# Patient Record
Sex: Female | Born: 1980 | Race: White | Hispanic: Yes | Marital: Single | Smoking: Never smoker
Health system: Southern US, Community
[De-identification: ages and names within clinical notes are randomized; demographics above are authoritative.]

## PROBLEM LIST (undated history)

## (undated) ENCOUNTER — Inpatient Hospital Stay (HOSPITAL_COMMUNITY): Payer: Self-pay

## (undated) DIAGNOSIS — E669 Obesity, unspecified: Secondary | ICD-10-CM

## (undated) DIAGNOSIS — E282 Polycystic ovarian syndrome: Secondary | ICD-10-CM

## (undated) HISTORY — DX: Obesity, unspecified: E66.9

---

## 2013-08-06 ENCOUNTER — Encounter (HOSPITAL_COMMUNITY): Payer: Self-pay | Admitting: Medical

## 2013-08-06 ENCOUNTER — Inpatient Hospital Stay (HOSPITAL_COMMUNITY): Payer: Medicaid Other

## 2013-08-06 ENCOUNTER — Inpatient Hospital Stay (HOSPITAL_COMMUNITY)
Admission: AD | Admit: 2013-08-06 | Discharge: 2013-08-06 | Disposition: A | Discharge status: Home / Self Care | Payer: Self-pay | Source: Ambulatory Visit | Attending: Obstetrics & Gynecology | Admitting: Obstetrics & Gynecology

## 2013-08-06 DIAGNOSIS — O2390 Unspecified genitourinary tract infection in pregnancy, unspecified trimester: Secondary | ICD-10-CM | POA: Insufficient documentation

## 2013-08-06 DIAGNOSIS — N760 Acute vaginitis: Secondary | ICD-10-CM | POA: Insufficient documentation

## 2013-08-06 DIAGNOSIS — A499 Bacterial infection, unspecified: Secondary | ICD-10-CM | POA: Insufficient documentation

## 2013-08-06 DIAGNOSIS — K2970 Gastritis, unspecified, without bleeding: Secondary | ICD-10-CM | POA: Insufficient documentation

## 2013-08-06 DIAGNOSIS — O26899 Other specified pregnancy related conditions, unspecified trimester: Secondary | ICD-10-CM

## 2013-08-06 DIAGNOSIS — B9689 Other specified bacterial agents as the cause of diseases classified elsewhere: Secondary | ICD-10-CM | POA: Insufficient documentation

## 2013-08-06 DIAGNOSIS — R109 Unspecified abdominal pain: Secondary | ICD-10-CM | POA: Insufficient documentation

## 2013-08-06 DIAGNOSIS — O9989 Other specified diseases and conditions complicating pregnancy, childbirth and the puerperium: Secondary | ICD-10-CM

## 2013-08-06 LAB — WET PREP, GENITAL
Trich, Wet Prep: NONE SEEN
Yeast Wet Prep HPF POC: NONE SEEN

## 2013-08-06 LAB — CBC
MCH: 29 pg (ref 26.0–34.0)
MCHC: 33.9 g/dL (ref 30.0–36.0)
Platelets: 318 10*3/uL (ref 150–400)
RBC: 4.2 MIL/uL (ref 3.87–5.11)
RDW: 13.1 % (ref 11.5–15.5)

## 2013-08-06 LAB — URINALYSIS, ROUTINE W REFLEX MICROSCOPIC
Bilirubin Urine: NEGATIVE
Glucose, UA: NEGATIVE mg/dL
Ketones, ur: NEGATIVE mg/dL
Leukocytes, UA: NEGATIVE
Specific Gravity, Urine: >1.03 — ABNORMAL HIGH (ref 1.005–1.030)
Urobilinogen, UA: 0.2 mg/dL (ref 0.0–1.0)
pH: 6 (ref 5.0–8.0)

## 2013-08-06 LAB — URINE MICROSCOPIC-ADD ON

## 2013-08-06 LAB — ABO/RH: ABO/RH(D): O POS

## 2013-08-06 LAB — POCT PREGNANCY, URINE: Preg Test, Ur: POSITIVE — AB

## 2013-08-06 MED ORDER — METRONIDAZOLE 500 MG PO TABS: 500.0000 mg | ORAL_TABLET | Freq: Two times a day (BID) | ORAL | Status: DC

## 2013-08-06 MED ORDER — FAMOTIDINE 20 MG PO TABS: 20.0000 mg | ORAL_TABLET | Freq: Two times a day (BID) | ORAL | Status: DC

## 2013-08-06 NOTE — Progress Notes (Signed)
Unable to auscultate FHT's via doppler.

## 2013-08-06 NOTE — MAU Provider Note (Signed)
Attestation of Attending Supervision of Advanced Practitioner (CNM/NP): Evaluation and management procedures were performed by the Advanced Practitioner under my supervision and collaboration.  I have reviewed the Advanced Practitioner's note and chart, and I agree with the management and plan.  HARRAWAY-SMITH, Edwyna Dangerfield 3:32 PM

## 2013-08-06 NOTE — MAU Provider Note (Signed)
History     CSN: [id number]  Arrival date and time: 08/06/13 7829   First Provider Initiated Contact with Patient 08/06/13 0914      Chief Complaint  Patient presents with  . Abdominal Pain   HPI Ashley Grant is a 32 y.o. G1P0 at [redacted]w[redacted]d who presents to MAU today with complaint of diffuse abdominal pain since this morning. The patient was seen at pregnancy care clinic yesterday and had [place] that showed [redacted]w[redacted]d fetus, but cardiac activity was unable to be obtained per report. The quality of the imaging was poor per the noted on the report. The patient states that her pain is burning and cramping and rates it at 7/10 this morning, although she is having no pain now. She states that the pain comes and goes. She does endorse occasional constipation, last BM was yesterday. She denies vaginal bleeding, discharge, UTI symptoms, fever, N/V/D.   OB History   Grav Para Term Preterm Abortions TAB SAB Ect Mult Living   1               No past medical history on file.  No past surgical history on file.  No family history on file.  History  Substance Use Topics  . Smoking status: Not on file  . Smokeless tobacco: Not on file  . Alcohol Use: Not on file    Allergies: No Known Allergies  No prescriptions prior to admission    Review of Systems  Constitutional: Negative for fever and malaise/fatigue.  Gastrointestinal: Positive for abdominal pain and constipation. Negative for heartburn, nausea, vomiting and diarrhea.  Genitourinary: Negative for dysuria, urgency and frequency.       Neg - vaginal bleeding, discharge   Physical Exam   Blood pressure 134/84, pulse 82, temperature 97.5 F (36.4 C), temperature source Oral, resp. rate 18, height 5\' 4"  (1.626 m), weight 198 lb 4 oz (89.926 kg), last menstrual period 05/12/2013.  Physical Exam  Constitutional: She is oriented to person, place, and time. She appears well-developed and well-nourished. No distress.  HENT:  Head:  Normocephalic and atraumatic.  Cardiovascular: Normal rate, regular rhythm and normal heart sounds.   Respiratory: Effort normal and breath sounds normal. No respiratory distress.  GI: Soft. Bowel sounds are normal. She exhibits no distension and no mass. There is no tenderness. There is no rebound and no guarding.  Genitourinary: Uterus is enlarged (appropriate for GA). Uterus is not tender. Cervix exhibits no motion tenderness, no discharge and no friability. Right adnexum displays no mass and no tenderness. Left adnexum displays no mass and no tenderness. No bleeding around the vagina. Vaginal discharge (moderate amount of thin, white discharge noted) found.  Neurological: She is alert and oriented to person, place, and time.  Skin: Skin is warm and dry. No erythema.  Psychiatric: She has a normal mood and affect.   Results for orders placed during the hospital encounter of 08/06/13 (from the past 24 hour(s))  URINALYSIS, ROUTINE W REFLEX MICROSCOPIC     Status: Abnormal   Collection Time    08/06/13  7:50 AM      Result Value Range   Color, Urine YELLOW  YELLOW   APPearance CLEAR  CLEAR   Specific Gravity, Urine >1.030 (*) 1.005 - 1.030   pH 6.0  5.0 - 8.0   Glucose, UA NEGATIVE  NEGATIVE mg/dL   Hgb urine dipstick TRACE (*) NEGATIVE   Bilirubin Urine NEGATIVE  NEGATIVE   Ketones, ur NEGATIVE  NEGATIVE mg/dL  Protein, ur NEGATIVE  NEGATIVE mg/dL   Urobilinogen, UA 0.2  0.0 - 1.0 mg/dL   Nitrite NEGATIVE  NEGATIVE   Leukocytes, UA NEGATIVE  NEGATIVE  URINE MICROSCOPIC-ADD ON     Status: Abnormal   Collection Time    08/06/13  7:50 AM      Result Value Range   Squamous Epithelial / LPF RARE  RARE   WBC, UA 7-10  <3 WBC/hpf   Bacteria, UA FEW (*) RARE  POCT PREGNANCY, URINE     Status: Abnormal   Collection Time    08/06/13  8:03 AM      Result Value Range   Preg Test, Ur POSITIVE (*) NEGATIVE  WET PREP, GENITAL     Status: Abnormal   Collection Time    08/06/13  9:32 AM       Result Value Range   Yeast Wet Prep HPF POC NONE SEEN  NONE SEEN   Trich, Wet Prep NONE SEEN  NONE SEEN   Clue Cells Wet Prep HPF POC FEW (*) NONE SEEN   WBC, Wet Prep HPF POC MODERATE (*) NONE SEEN  CBC     Status: None   Collection Time    08/06/13  9:36 AM      Result Value Range   WBC 7.0  4.0 - 10.5 K/uL   RBC 4.20  3.87 - 5.11 MIL/uL   Hemoglobin 12.2  12.0 - 15.0 g/dL   HCT 09.8  11.9 - 14.7 %   MCV 85.7  78.0 - 100.0 fL   MCH 29.0  26.0 - 34.0 pg   MCHC 33.9  30.0 - 36.0 g/dL   RDW 82.9  56.2 - 13.0 %   Platelets 318  150 - 400 K/uL  ABO/RH     Status: None   Collection Time    08/06/13  9:36 AM      Result Value Range   ABO/RH(D) O POS    HCG, QUANTITATIVE, PREGNANCY     Status: Abnormal   Collection Time    08/06/13  9:36 AM      Result Value Range   hCG, Beta Chain, Quant, S 1387 (*) <5 mIU/mL   US Ob Comp Less 14 Wks  08/06/2013   CLINICAL DATA:  Pelvic pain, no fetal heart tones on exam  EXAM: OBSTETRIC <14 WK ULTRASOUND  TECHNIQUE: Transabdominal ultrasound was performed for evaluation of the gestation as well as the maternal uterus and adnexal regions.  COMPARISON:  None.  FINDINGS: Intrauterine gestational sac: Probable early intrauterine gestational sac  Yolk sac:  Not visualized  Embryo:  Not visualized  Cardiac Activity: Not visualized  MSD: 4  mm   4 w   6  d       [place] EDC: 04/09/2014  Maternal uterus/adnexae: The ovaries are normal. No free fluid.  IMPRESSION: Probable early intrauterine gestational sac. No yolk sac, fetal pole, or cardiac activity yet identified. Followup is recommended in 14 days to determine appropriate pregnancy progression and for purposes of dating.   Electronically Signed   By: Christiana Pellant M.D.   On: 08/06/2013 11:18   US Ob Transvaginal  08/06/2013   CLINICAL DATA:  Pelvic pain, no fetal heart tones on exam  EXAM: OBSTETRIC <14 WK ULTRASOUND  TECHNIQUE: Transabdominal ultrasound was performed for evaluation of the gestation as well  as the maternal uterus and adnexal regions.  COMPARISON:  None.  FINDINGS: Intrauterine gestational sac: Probable early intrauterine gestational sac  Yolk  sac:  Not visualized  Embryo:  Not visualized  Cardiac Activity: Not visualized  MSD: 4  mm   4 w   6  d       [place] EDC: 04/09/2014  Maternal uterus/adnexae: The ovaries are normal. No free fluid.  IMPRESSION: Probable early intrauterine gestational sac. No yolk sac, fetal pole, or cardiac activity yet identified. Followup is recommended in 14 days to determine appropriate pregnancy progression and for purposes of dating.   Electronically Signed   By: Christiana Pellant M.D.   On: 08/06/2013 11:18     MAU Course  Procedures None  MDM Unable to obtain FHTs UPT - Positive UA, Wet prep, GC/Chlamydia today, CBC, ABO/Rh, quant hCG and [place] today Assessment and Plan  A: Probably IUGS at [redacted]w[redacted]d Abdominal pain in pregnancy, antepartum Gastritis Bacterial vaginosis  P: Discharge home Rx for Flagyl and Pepcid sent to patient's pharmacy Patient advised to discontinue weight loss medication and resume healthy eating habits First trimester warning signs discussed Patient to return to MAU in 48 hours for follow-up quant hCG Patient may return to MAU as needed or if her condition were to change or worsen   Freddi Starr, PA-C  08/06/2013, 1:18 PM

## 2013-08-06 NOTE — MAU Note (Signed)
Patient presents complaining of abdominal pain over entire abdomen since last night that burns.

## 2013-08-07 LAB — GC/CHLAMYDIA PROBE AMP: CT Probe RNA: NEGATIVE

## 2013-08-07 LAB — URINE CULTURE: Colony Count: 45000

## 2013-08-10 ENCOUNTER — Inpatient Hospital Stay (HOSPITAL_COMMUNITY)
Admission: AD | Admit: 2013-08-10 | Discharge: 2013-08-10 | Disposition: A | Discharge status: Home / Self Care | Payer: Self-pay | Source: Ambulatory Visit | Attending: Obstetrics and Gynecology | Admitting: Obstetrics and Gynecology

## 2013-08-10 ENCOUNTER — Inpatient Hospital Stay (HOSPITAL_COMMUNITY): Payer: Medicaid Other

## 2013-08-10 ENCOUNTER — Encounter (HOSPITAL_COMMUNITY): Payer: Self-pay | Admitting: *Deleted

## 2013-08-10 DIAGNOSIS — O209 Hemorrhage in early pregnancy, unspecified: Secondary | ICD-10-CM | POA: Insufficient documentation

## 2013-08-10 DIAGNOSIS — Z3490 Encounter for supervision of normal pregnancy, unspecified, unspecified trimester: Secondary | ICD-10-CM

## 2013-08-10 LAB — URINE MICROSCOPIC-ADD ON

## 2013-08-10 LAB — URINALYSIS, ROUTINE W REFLEX MICROSCOPIC
Bilirubin Urine: NEGATIVE
Ketones, ur: NEGATIVE mg/dL
Protein, ur: NEGATIVE mg/dL
Specific Gravity, Urine: 1.01 (ref 1.005–1.030)
Urobilinogen, UA: 0.2 mg/dL (ref 0.0–1.0)

## 2013-08-10 LAB — HCG, QUANTITATIVE, PREGNANCY: hCG, Beta Chain, Quant, S: 8278 m[IU]/mL — ABNORMAL HIGH (ref <5)

## 2013-08-10 NOTE — MAU Note (Addendum)
Patient presents to MAU with lower abdominal cramping and spotted bleeding that started this am. Patient not having to wear a pad at this time. Patient states had f/u appointment yesterday but was unable to come due to being out of town.

## 2013-08-10 NOTE — MAU Provider Note (Signed)
Attestation of Attending Supervision of Advanced Practitioner (CNM/NP): Evaluation and management procedures were performed by the Advanced Practitioner under my supervision and collaboration.  I have reviewed the Advanced Practitioner's note and chart, and I agree with the management and plan.  Nicolette Gieske 08/10/2013 2:20 PM

## 2013-08-10 NOTE — MAU Provider Note (Signed)
History     CSN: [id number]  Arrival date and time: 08/10/13 1125   First Provider Initiated Contact with Patient 08/10/13 1159      Chief Complaint  Patient presents with  . Vaginal Bleeding   HPI  Ashley Grant is a 32 y.o. female G1P0 at [redacted]w[redacted]d who presents to MAU for vaginal bleeding. She was here on 9/26 for abdominal pain and had a positive pregnancy test; US showed a probable gestational sac. She was supposed to come back on 9/28 for repeat beta Hcg, however she was out of town. Today she noticed pink spotting when she wiped after using the bathroom. While here in MAU she went to the bathroom and noticed brown discharge; no intercourse recently. No pain at this time; 0/10.  She has an appointment with the [facility] department this Friday.   OB History   Grav Para Term Preterm Abortions TAB SAB Ect Mult Living   1               History reviewed. No pertinent past medical history.  History reviewed. No pertinent past surgical history.  Family History  Problem Relation Age of Onset  . Diabetes Father   . Diabetes Maternal Grandfather   . Cancer Paternal Grandfather     History  Substance Use Topics  . Smoking status: Never Smoker   . Smokeless tobacco: Not on file  . Alcohol Use: No    Allergies: No Known Allergies  Prescriptions prior to admission  Medication Sig Dispense Refill  . famotidine (PEPCID) 20 MG tablet Take 1 tablet (20 mg total) by mouth 2 (two) times daily.  30 tablet  0  . metroNIDAZOLE (FLAGYL) 500 MG tablet Take 1 tablet (500 mg total) by mouth 2 (two) times daily.  14 tablet  0   Results for orders placed during the hospital encounter of 08/10/13 (from the past 24 hour(s))  URINALYSIS, ROUTINE W REFLEX MICROSCOPIC     Status: Abnormal   Collection Time    08/10/13 11:56 AM      Result Value Range   Color, Urine YELLOW  YELLOW   APPearance HAZY (*) CLEAR   Specific Gravity, Urine 1.010  1.005 - 1.030   pH 6.0  5.0 - 8.0    Glucose, UA NEGATIVE  NEGATIVE mg/dL   Hgb urine dipstick LARGE (*) NEGATIVE   Bilirubin Urine NEGATIVE  NEGATIVE   Ketones, ur NEGATIVE  NEGATIVE mg/dL   Protein, ur NEGATIVE  NEGATIVE mg/dL   Urobilinogen, UA 0.2  0.0 - 1.0 mg/dL   Nitrite NEGATIVE  NEGATIVE   Leukocytes, UA NEGATIVE  NEGATIVE  URINE MICROSCOPIC-ADD ON     Status: Abnormal   Collection Time    08/10/13 11:56 AM      Result Value Range   Squamous Epithelial / LPF FEW (*) RARE   WBC, UA 0-2  <3 WBC/hpf   RBC / HPF 0-2  <3 RBC/hpf   Bacteria, UA FEW (*) RARE  HCG, QUANTITATIVE, PREGNANCY     Status: Abnormal   Collection Time    08/10/13 12:15 PM      Result Value Range   hCG, Beta Chain, Quant, S 8278 (*) <5 mIU/mL   US Ob Transvaginal  08/10/2013   CLINICAL DATA:  Pregnant, bleeding  EXAM: TRANSVAGINAL OB ULTRASOUND  TECHNIQUE: Transvaginal ultrasound was performed for complete evaluation of the gestation as well as the maternal uterus, adnexal regions, and pelvic cul-de-sac.  COMPARISON:  08/06/2013  FINDINGS: Intrauterine gestational sac: Visualized/normal in shape.  Yolk sac:  Present  Embryo:  Not visualized  MSD: 7.9  mm   5 w   3  d  [place] EDC: 04/09/2014  Maternal uterus/adnexae: No evidence of subchorionic hemorrhage.  Bilateral ovaries are within normal limits.  No free fluid.  IMPRESSION: Single intrauterine gestational sac with yolk sac, measuring 5 weeks 3 days by mean sac diameter. Fetal pole is not visualized.  Follow-up pelvic ultrasound is suggested in 10-14 days to confirm viability.   Electronically Signed   By: Charline Bills M.D.   On: 08/10/2013 13:41     Review of Systems  Constitutional: Negative for fever and chills.  Gastrointestinal: Negative for nausea, vomiting, abdominal pain, diarrhea and constipation.  Genitourinary: Negative for dysuria, urgency, frequency and hematuria.       + brown vaginal discharge. No vaginal bleeding. No dysuria.   Neurological: Negative for headaches.    Physical Exam   Blood pressure 123/74, pulse 90, temperature 98.1 F (36.7 C), temperature source Oral, resp. rate 18, height 5\' 4"  (1.626 m), weight 90.084 kg (198 lb 9.6 oz), last menstrual period 05/12/2013, SpO2 99.00%.  Physical Exam  Constitutional: She is oriented to person, place, and time. She appears well-developed and well-nourished. No distress.  Neck: Neck supple.  Respiratory: Effort normal.  GI: Soft. She exhibits no distension. There is no tenderness. There is no rebound and no guarding.  Genitourinary:  Speculum exam: Vagina - moderate amount of dark red blood in vaginal canal, no odor Cervix - small active bleeding from cervical os; dark red in color. Bimanual exam: Cervix closed Uterus non tender, normal size for gestational age  Adnexa non tender, no masses bilaterally Chaperone present for exam.   Neurological: She is alert and oriented to person, place, and time.  Skin: Skin is warm and dry. She is not diaphoretic.    MAU Course  Procedures None  MDM Repeat Beta Hcg Repeat transvaginal US   O positive blood type Assessment and Plan  A: Singling intrauterine gestational sac with yolk sac Vaginal bleeding in first trimester   P: Discharge home Pelvic rest discussed Bleeding precautions discussed First trimester warning signs discussed  Start prenatal care as soon as possible Return to MAU if symptoms worsen Proof of pregnancy letter provided to the patient   Debbrah Alar FNP-C 08/10/2013, 2:04 PM

## 2013-09-20 ENCOUNTER — Other Ambulatory Visit: Payer: Self-pay

## 2013-09-20 ENCOUNTER — Other Ambulatory Visit (HOSPITAL_COMMUNITY): Payer: Self-pay | Admitting: Physician Assistant

## 2013-09-20 DIAGNOSIS — Z3682 Encounter for antenatal screening for nuchal translucency: Secondary | ICD-10-CM

## 2013-09-20 LAB — OB RESULTS CONSOLE HEPATITIS B SURFACE ANTIGEN: Hepatitis B Surface Ag: NEGATIVE

## 2013-09-20 LAB — OB RESULTS CONSOLE GC/CHLAMYDIA
CHLAMYDIA, DNA PROBE: NEGATIVE
Gonorrhea: NEGATIVE

## 2013-09-20 LAB — OB RESULTS CONSOLE ABO/RH: RH Type: POSITIVE

## 2013-09-20 LAB — OB RESULTS CONSOLE HIV ANTIBODY (ROUTINE TESTING): HIV: NONREACTIVE

## 2013-09-20 LAB — OB RESULTS CONSOLE RUBELLA ANTIBODY, IGM: RUBELLA: IMMUNE

## 2013-09-20 LAB — OB RESULTS CONSOLE ANTIBODY SCREEN: Antibody Screen: NEGATIVE

## 2013-09-27 ENCOUNTER — Ambulatory Visit (HOSPITAL_COMMUNITY)
Admission: RE | Admit: 2013-09-27 | Discharge: 2013-09-27 | Disposition: A | Discharge status: Home / Self Care | Payer: Medicaid Other | Source: Ambulatory Visit | Attending: Physician Assistant | Admitting: Physician Assistant

## 2013-09-27 ENCOUNTER — Encounter (HOSPITAL_COMMUNITY): Payer: Self-pay

## 2013-09-27 ENCOUNTER — Other Ambulatory Visit: Payer: Self-pay

## 2013-09-27 VITALS — BP 116/77 | HR 112 | Wt 208.0 lb

## 2013-09-27 DIAGNOSIS — Z3689 Encounter for other specified antenatal screening: Secondary | ICD-10-CM | POA: Insufficient documentation

## 2013-09-27 DIAGNOSIS — O351XX0 Maternal care for (suspected) chromosomal abnormality in fetus, not applicable or unspecified: Secondary | ICD-10-CM | POA: Insufficient documentation

## 2013-09-27 DIAGNOSIS — O3510X0 Maternal care for (suspected) chromosomal abnormality in fetus, unspecified, not applicable or unspecified: Secondary | ICD-10-CM | POA: Insufficient documentation

## 2013-09-27 DIAGNOSIS — Z3682 Encounter for antenatal screening for nuchal translucency: Secondary | ICD-10-CM

## 2013-10-01 ENCOUNTER — Encounter (HOSPITAL_COMMUNITY): Payer: Self-pay | Admitting: *Deleted

## 2013-10-18 ENCOUNTER — Other Ambulatory Visit: Payer: Self-pay | Admitting: Family Medicine

## 2013-10-18 DIAGNOSIS — Z3689 Encounter for other specified antenatal screening: Secondary | ICD-10-CM

## 2013-11-15 ENCOUNTER — Ambulatory Visit (HOSPITAL_COMMUNITY): Payer: Self-pay

## 2013-11-16 ENCOUNTER — Ambulatory Visit (HOSPITAL_COMMUNITY)
Admission: RE | Admit: 2013-11-16 | Discharge: 2013-11-16 | Disposition: A | Discharge status: Home / Self Care | Payer: Medicaid Other | Source: Ambulatory Visit | Attending: Family Medicine | Admitting: Family Medicine

## 2013-11-16 DIAGNOSIS — Z3689 Encounter for other specified antenatal screening: Secondary | ICD-10-CM | POA: Insufficient documentation

## 2013-11-18 ENCOUNTER — Other Ambulatory Visit (HOSPITAL_COMMUNITY): Payer: Self-pay | Admitting: Family

## 2013-11-18 DIAGNOSIS — Z1389 Encounter for screening for other disorder: Secondary | ICD-10-CM

## 2013-12-20 ENCOUNTER — Ambulatory Visit (HOSPITAL_COMMUNITY)
Admission: RE | Admit: 2013-12-20 | Discharge: 2013-12-20 | Disposition: A | Discharge status: Home / Self Care | Payer: Medicaid Other | Source: Ambulatory Visit | Attending: Family | Admitting: Family

## 2013-12-20 DIAGNOSIS — Z3689 Encounter for other specified antenatal screening: Secondary | ICD-10-CM | POA: Insufficient documentation

## 2013-12-20 DIAGNOSIS — Z1389 Encounter for screening for other disorder: Secondary | ICD-10-CM

## 2013-12-20 NOTE — Progress Notes (Signed)
MFM ultrasound  Indication: 33 yr old G1P0 at 5555w4d for follow up ultrasound to complete fetal anatomy. Remote read.  Findings: 1. Single intrauterine pregnancy. 2. Estimated fetal weight is in the 57th%. 3. Posterior placenta without evidence of previa. 4. Normal amniotic fluid volume. 5. The view of the right outflow tract is limited. 6. The remainder of the limited anatomy survey is normal.  Recommendations: 1. Appropriate fetal growth . 2. Limited heart views: - recommend follow up in [facility] so MFM may scan 3. Normal first trimester screen  Ashley FosterKristen Inas Avena, MD

## 2014-01-10 ENCOUNTER — Encounter (HOSPITAL_COMMUNITY): Payer: Self-pay | Admitting: Nurse Practitioner

## 2014-01-10 ENCOUNTER — Other Ambulatory Visit (HOSPITAL_COMMUNITY): Payer: Self-pay | Admitting: Nurse Practitioner

## 2014-01-10 DIAGNOSIS — O2690 Pregnancy related conditions, unspecified, unspecified trimester: Secondary | ICD-10-CM

## 2014-01-13 ENCOUNTER — Ambulatory Visit (HOSPITAL_COMMUNITY)
Admission: RE | Admit: 2014-01-13 | Discharge: 2014-01-13 | Disposition: A | Discharge status: Home / Self Care | Payer: Medicaid Other | Source: Ambulatory Visit | Attending: Nurse Practitioner | Admitting: Nurse Practitioner

## 2014-01-13 ENCOUNTER — Other Ambulatory Visit (HOSPITAL_COMMUNITY): Payer: Self-pay | Admitting: Nurse Practitioner

## 2014-01-13 DIAGNOSIS — O2690 Pregnancy related conditions, unspecified, unspecified trimester: Secondary | ICD-10-CM

## 2014-01-13 DIAGNOSIS — Z3689 Encounter for other specified antenatal screening: Secondary | ICD-10-CM | POA: Insufficient documentation

## 2014-01-25 LAB — OB RESULTS CONSOLE RPR: RPR: NONREACTIVE

## 2014-03-14 ENCOUNTER — Other Ambulatory Visit (HOSPITAL_COMMUNITY): Payer: Self-pay | Admitting: Nurse Practitioner

## 2014-03-14 DIAGNOSIS — O3660X0 Maternal care for excessive fetal growth, unspecified trimester, not applicable or unspecified: Secondary | ICD-10-CM

## 2014-03-14 LAB — OB RESULTS CONSOLE GBS: GBS: NEGATIVE

## 2014-03-15 ENCOUNTER — Ambulatory Visit (HOSPITAL_COMMUNITY)
Admission: RE | Admit: 2014-03-15 | Discharge: 2014-03-15 | Disposition: A | Discharge status: Home / Self Care | Payer: Self-pay | Source: Ambulatory Visit | Attending: Nurse Practitioner | Admitting: Nurse Practitioner

## 2014-03-15 DIAGNOSIS — O3660X0 Maternal care for excessive fetal growth, unspecified trimester, not applicable or unspecified: Secondary | ICD-10-CM | POA: Insufficient documentation

## 2014-03-15 DIAGNOSIS — Z3689 Encounter for other specified antenatal screening: Secondary | ICD-10-CM | POA: Insufficient documentation

## 2014-03-24 ENCOUNTER — Inpatient Hospital Stay (HOSPITAL_COMMUNITY)
Admission: AD | Admit: 2014-03-24 | Discharge: 2014-03-24 | Disposition: A | Discharge status: Home / Self Care | Payer: Medicaid Other | Source: Ambulatory Visit | Attending: Obstetrics and Gynecology | Admitting: Obstetrics and Gynecology

## 2014-03-24 ENCOUNTER — Encounter (HOSPITAL_COMMUNITY): Payer: Self-pay | Admitting: *Deleted

## 2014-03-24 DIAGNOSIS — Z833 Family history of diabetes mellitus: Secondary | ICD-10-CM | POA: Insufficient documentation

## 2014-03-24 DIAGNOSIS — O99891 Other specified diseases and conditions complicating pregnancy: Secondary | ICD-10-CM | POA: Insufficient documentation

## 2014-03-24 DIAGNOSIS — G5600 Carpal tunnel syndrome, unspecified upper limb: Secondary | ICD-10-CM | POA: Insufficient documentation

## 2014-03-24 DIAGNOSIS — IMO0002 Reserved for concepts with insufficient information to code with codable children: Secondary | ICD-10-CM | POA: Insufficient documentation

## 2014-03-24 DIAGNOSIS — O9989 Other specified diseases and conditions complicating pregnancy, childbirth and the puerperium: Secondary | ICD-10-CM

## 2014-03-24 DIAGNOSIS — O1203 Gestational edema, third trimester: Secondary | ICD-10-CM

## 2014-03-24 DIAGNOSIS — R209 Unspecified disturbances of skin sensation: Secondary | ICD-10-CM | POA: Insufficient documentation

## 2014-03-24 LAB — URINE MICROSCOPIC-ADD ON

## 2014-03-24 LAB — URINALYSIS, ROUTINE W REFLEX MICROSCOPIC
Bilirubin Urine: NEGATIVE
GLUCOSE, UA: 250 mg/dL — AB
Ketones, ur: 15 mg/dL — AB
Leukocytes, UA: NEGATIVE
Nitrite: NEGATIVE
PROTEIN: NEGATIVE mg/dL
Specific Gravity, Urine: >1.03 — ABNORMAL HIGH (ref 1.005–1.030)
UROBILINOGEN UA: 0.2 mg/dL (ref 0.0–1.0)
pH: 6 (ref 5.0–8.0)

## 2014-03-24 NOTE — MAU Note (Signed)
Swelling in feet and hands, numbness in hands, started about two days ago and has gotten worse.  Denies LOF/VB.

## 2014-03-24 NOTE — MAU Provider Note (Signed)
None     Chief Complaint:  Leg Swelling and Numbness in hands  Carrolyn LeighSusana Siliesar-Lopez is  33 y.o. G1P0 at 5951w5d presents complaining of Leg Swelling and Numbness She denies HA/RUQ pain/visual changes.  Wrists are mildly sore. Obstetrical/Gynecological History: OB History   Grav Para Term Preterm Abortions TAB SAB Ect Mult Living   1              Past Medical History: History reviewed. No pertinent past medical history.  Past Surgical History: History reviewed. No pertinent past surgical history.  Family History: Family History  Problem Relation Age of Onset  . Diabetes Father   . Diabetes Maternal Grandfather   . Cancer Paternal Grandfather     Social History: History  Substance Use Topics  . Smoking status: Never Smoker   . Smokeless tobacco: Not on file  . Alcohol Use: No    Allergies: No Known Allergies  Meds:  Prescriptions prior to admission  Medication Sig Dispense Refill  . calcium carbonate (TUMS - DOSED IN MG ELEMENTAL CALCIUM) 500 MG chewable tablet Chew 1-2 tablets by mouth daily as needed for indigestion or heartburn.      . Menthol-Methyl Salicylate (ICY HOT EXTRA STRENGTH) 10-30 % CREA Apply 1 application topically daily as needed (For swelling and pain in legs.).      Marland Kitchen. OVER THE COUNTER MEDICATION Apply 1 application topically daily. Patient is using Goicoechea lotion on legs once a day for swelling and pain in legs.      . Prenatal Vit-Fe Fumarate-FA (PRENATAL MULTIVITAMIN) TABS tablet Take 1 tablet by mouth daily at 12 noon.        Review of Systems   Constitutional: Negative for fever and chills Eyes: Negative for visual disturbances Respiratory: Negative for shortness of breath, dyspnea Cardiovascular: Negative for chest pain or palpitations  Gastrointestinal: Negative for vomiting, diarrhea and constipation Genitourinary: Negative for dysuria and urgency Musculoskeletal: Negative for back pain, joint pain, myalgias  Neurological: Negative  for dizziness and headaches    Physical Exam  Blood pressure 127/80, pulse 111, temperature 98 F (36.7 C), temperature source Oral, resp. rate 18, last menstrual period 05/12/2013. 100/81 GENERAL: Well-developed, well-nourished female in no acute distress.  LUNGS: Clear to auscultation bilaterally.  HEART: Regular rate and rhythm. ABDOMEN: Soft, nontender, nondistended, gravid.  EXTREMITIES: Nontender, 2+ edema, 2+ distal pulses. DTR's 2+ FHT:  Baseline rate 145 bpm   Variability moderate  Accelerations present   Decelerations none Contractions: Every 0 mins   Labs: No results found for this or any previous visit (from the past 24 hour(s)). IAssessment: Carrolyn LeighSusana Siliesar-Lopez is  33 y.o. G1P0 at 5051w5d presents with swelling of pregnancy; carpal tunnel.  Plan: Splint wrists prn; increase po fluids; compression socks; elevate legs  Jacklyn ShellFrances Cresenzo-Dishmon 5/14/20159:18 PM

## 2014-03-24 NOTE — Discharge Instructions (Signed)
Síndrome del túnel carpiano   (Carpal Tunnel Syndrome)   El túnel carpiano es un espacio estrecho ubicado en el lado palmar de la muñeca. Está formado por los huesos de la muñeca y los ligamentos. Los nervios, vasos sanguíneos y tendones pasan a través del túnel carpiano. Los movimientos de la muñeca o ciertas enfermedades pueden causar hinchazón del túnel. Esta hinchazón comprime el nervio principal en la muñeca ((nervio mediano) y ocasiona un trastorno doloroso que se denomina síndrome del túnel carpiano.  CAUSAS   · Movimientos repetidos de la muñeca.  · Lesiones en la muñeca.  · Ciertas enfermedades como la artritis, la diabetes, el alcoholismo, el hipertiroidismo o la insuficiencia renal.  · Obesidad.  · Embarazo.  SÍNTOMAS   · Sensación de "pinchazos" en los dedos o la mano.  · Hormigueo o entumecimiento en los dedos o en la mano.  · Sensación dolorosa en todo el brazo.  · Dolor en la muñeca que sube por el brazo hasta el hombro.  · Dolor que baja por la mano o los dedos.  · Sensación de debilidad en las manos.  DIAGNÓSTICO   El médico le hará una historia clínica y un examen físico. Puede ser necesario hacer un electromiograma. Esta prueba mide las señales eléctricas enviadas por los músculos. Generalmente el paso de las señales eléctricas es impedido por el síndrome del túnel carpiano. También puede ser necesario que le tomen radiografías.   TRATAMIENTO   El síndrome del túnel carpiano puede curarse espontáneamente sin tratamiento. El médico le indicará el uso de un cabestrillo para la muñeca o que tome medicamentos como antiinflamatorios no esteroides. Las inyecciones de cortisona pueden ayudar. A veces es necesaria la cirugía para liberar el nervio comprimido.   INSTRUCCIONES PARA EL CUIDADO EN EL HOGAR   · Tome todos los medicamentos según le indicó su médico. Sólo tome medicamentos de venta libre o recetados para calmar el dolor, las molestias o bajar la fiebre según las indicaciones de su médico.  · Si  le aconsejaron usar un cabestrillo para evitar que la muñeca se doble, úselo como le indicaron. Es importante que use el cabestrillo durante la noche. Úselo mientras sienta dolor o adormecimiento en la mano, el brazo o la muñeca. Esto puede durar entre 1 y 2 meses.  · Haga reposar la muñeca de toda actividad que le cause dolor. Si sus síntomas están relacionados con el trabajo, deberá conversar con su empleador acerca de la posibilidad de cambiar a una tarea que no requiera el uso de la muñeca.  · Aplique hielo en la muñeca después de los períodos prolongados de actividad.  · Ponga el hielo en una bolsa plástica.  · Colóquese una toalla entre la piel y la bolsa de hielo.  · Deje el hielo en el lugar durante 15 a 20 minutos, 3 a 4 veces por día.  · Cumpla con todas las visitas de control, según le indique su médico. Aquí se incluyen derivaciones a un ortopedista, fisioterapia y rehabilitación. Toda demora en obtener la asistencia necesaria puede dar como resultado una demora o fracaso en la curación.  SOLICITE ATENCIÓN MÉDICA DE INMEDIATO SI:   · Desarrolla nuevos e inexplicables síntomas.  · Los síntomas actuales empeoran y la medicación no los controla.  ASEGÚRESE DE QUE:   · Comprende estas instrucciones.  · Controlará su enfermedad.  · Solicitará ayuda de inmediato si no mejora o si empeora.  Document Released: 10/28/2005 Document Revised: 07/22/2012  Exit[employer]® Patient Information ©2014 [employer]   [employer].

## 2014-03-25 NOTE — MAU Provider Note (Signed)
Attestation of Attending Supervision of Advanced Practitioner (CNM/NP): Evaluation and management procedures were performed by the Advanced Practitioner under my supervision and collaboration.  I have reviewed the Advanced Practitioner's note and chart, and I agree with the management and plan.  Tekeya Geffert 03/25/2014 7:52 AM

## 2014-04-06 ENCOUNTER — Inpatient Hospital Stay (HOSPITAL_COMMUNITY)
Admission: AD | Admit: 2014-04-06 | Discharge: 2014-04-11 | DRG: 765 | Disposition: A | Discharge status: Home / Self Care | Payer: Medicaid Other | Source: Ambulatory Visit | Attending: Obstetrics & Gynecology | Admitting: Obstetrics & Gynecology

## 2014-04-06 ENCOUNTER — Encounter (HOSPITAL_COMMUNITY): Payer: Self-pay | Admitting: *Deleted

## 2014-04-06 DIAGNOSIS — E669 Obesity, unspecified: Secondary | ICD-10-CM | POA: Diagnosis present

## 2014-04-06 DIAGNOSIS — O99214 Obesity complicating childbirth: Secondary | ICD-10-CM

## 2014-04-06 DIAGNOSIS — Z833 Family history of diabetes mellitus: Secondary | ICD-10-CM | POA: Diagnosis not present

## 2014-04-06 DIAGNOSIS — O3660X0 Maternal care for excessive fetal growth, unspecified trimester, not applicable or unspecified: Secondary | ICD-10-CM

## 2014-04-06 DIAGNOSIS — O139 Gestational [pregnancy-induced] hypertension without significant proteinuria, unspecified trimester: Secondary | ICD-10-CM | POA: Diagnosis present

## 2014-04-06 DIAGNOSIS — O479 False labor, unspecified: Secondary | ICD-10-CM | POA: Diagnosis present

## 2014-04-06 DIAGNOSIS — Z6841 Body Mass Index (BMI) 40.0 and over, adult: Secondary | ICD-10-CM

## 2014-04-06 DIAGNOSIS — Z98891 History of uterine scar from previous surgery: Secondary | ICD-10-CM

## 2014-04-06 LAB — COMPREHENSIVE METABOLIC PANEL
ALT: 11 U/L (ref 0–35)
AST: 16 U/L (ref 0–37)
Albumin: 2.7 g/dL — ABNORMAL LOW (ref 3.5–5.2)
Alkaline Phosphatase: 152 U/L — ABNORMAL HIGH (ref 39–117)
BUN: 12 mg/dL (ref 6–23)
CO2: 20 mEq/L (ref 19–32)
Calcium: 8.9 mg/dL (ref 8.4–10.5)
Chloride: 105 mEq/L (ref 96–112)
Creatinine, Ser: 0.67 mg/dL (ref 0.50–1.10)
GFR calc Af Amer: >90 mL/min (ref >90)
Glucose, Bld: 77 mg/dL (ref 70–99)
Potassium: 4.5 mEq/L (ref 3.7–5.3)
SODIUM: 138 meq/L (ref 137–147)
Total Bilirubin: <0.2 mg/dL — ABNORMAL LOW (ref 0.3–1.2)
Total Protein: 5.8 g/dL — ABNORMAL LOW (ref 6.0–8.3)

## 2014-04-06 LAB — CBC
HCT: 34.7 % — ABNORMAL LOW (ref 36.0–46.0)
HEMOGLOBIN: 11.3 g/dL — AB (ref 12.0–15.0)
MCH: 27.6 pg (ref 26.0–34.0)
MCHC: 32.6 g/dL (ref 30.0–36.0)
MCV: 84.8 fL (ref 78.0–100.0)
Platelets: 283 10*3/uL (ref 150–400)
RBC: 4.09 MIL/uL (ref 3.87–5.11)
RDW: 14.5 % (ref 11.5–15.5)
WBC: 10.3 10*3/uL (ref 4.0–10.5)

## 2014-04-06 LAB — TYPE AND SCREEN
ABO/RH(D): O POS
Antibody Screen: NEGATIVE

## 2014-04-06 LAB — PROTEIN / CREATININE RATIO, URINE
Creatinine, Urine: 170.33 mg/dL
PROTEIN CREATININE RATIO: 0.1 (ref 0.00–0.15)
Total Protein, Urine: 16.4 mg/dL

## 2014-04-06 MED ORDER — OXYCODONE-ACETAMINOPHEN 5-325 MG PO TABS: 1.0000 | ORAL_TABLET | ORAL | Status: DC | PRN

## 2014-04-06 MED ORDER — IBUPROFEN 600 MG PO TABS: 600.0000 mg | ORAL_TABLET | Freq: Four times a day (QID) | ORAL | Status: DC | PRN

## 2014-04-06 MED ORDER — LIDOCAINE HCL (PF) 1 % IJ SOLN: 30.0000 mL | INTRAMUSCULAR | Status: DC | PRN

## 2014-04-06 MED ORDER — TERBUTALINE SULFATE 1 MG/ML IJ SOLN: 0.2500 mg | Freq: Once | INTRAMUSCULAR | Status: AC | PRN

## 2014-04-06 MED ORDER — MISOPROSTOL 25 MCG QUARTER TABLET
25.0000 ug | ORAL_TABLET | ORAL | Status: DC
  Administered 2014-04-06 – 2014-04-07 (×3): 25 ug via VAGINAL
  Filled 2014-04-06 (×3): qty 0.25

## 2014-04-06 MED ORDER — ACETAMINOPHEN 325 MG PO TABS
650.0000 mg | ORAL_TABLET | ORAL | Status: DC | PRN
Start: 2014-04-06 — End: 2014-04-09

## 2014-04-06 MED ORDER — LABETALOL HCL 5 MG/ML IV SOLN
10.0000 mg | INTRAVENOUS | Status: DC | PRN
Start: 2014-04-06 — End: 2014-04-09

## 2014-04-06 MED ORDER — OXYTOCIN 40 UNITS IN LACTATED RINGERS INFUSION - SIMPLE MED: 62.5000 mL/h | INTRAVENOUS | Status: DC

## 2014-04-06 MED ORDER — CITRIC ACID-SODIUM CITRATE 334-500 MG/5ML PO SOLN
30.0000 mL | ORAL | Status: DC | PRN
  Administered 2014-04-08: 30 mL via ORAL
  Filled 2014-04-06: qty 15

## 2014-04-06 MED ORDER — LACTATED RINGERS IV SOLN: 500.0000 mL | INTRAVENOUS | Status: DC | PRN

## 2014-04-06 MED ORDER — ZOLPIDEM TARTRATE 5 MG PO TABS
5.0000 mg | ORAL_TABLET | Freq: Every evening | ORAL | Status: DC | PRN
  Administered 2014-04-07: 5 mg via ORAL
  Filled 2014-04-06: qty 1

## 2014-04-06 MED ORDER — OXYTOCIN BOLUS FROM INFUSION: 500.0000 mL | INTRAVENOUS | Status: DC

## 2014-04-06 MED ORDER — LACTATED RINGERS IV SOLN
INTRAVENOUS | Status: DC
  Administered 2014-04-06 – 2014-04-08 (×7): via INTRAVENOUS

## 2014-04-06 MED ORDER — ONDANSETRON HCL 4 MG/2ML IJ SOLN
4.0000 mg | Freq: Four times a day (QID) | INTRAMUSCULAR | Status: DC | PRN
  Administered 2014-04-08: 4 mg via INTRAVENOUS
  Filled 2014-04-06: qty 2

## 2014-04-06 NOTE — Progress Notes (Signed)
Ashley Grant is a 33 y.o. G1P0 at [redacted]w[redacted]d   Subjective: Feels well; ready to begin induction; denies s/s preeclampsia  Objective: BP 142/101  Pulse 96  Temp(Src) 98.2 F (36.8 C) (Oral)  Resp 20  Ht 5\' 5"  (1.651 m)  Wt 120.385 kg (265 lb 6.4 oz)  BMI 44.16 kg/m2  SpO2 98%  LMP 05/12/2013      FHT:  FHR: 150s bpm, variability: moderate,  accelerations:  Present,  decelerations:  Absent UC:   Irreg, no pattern SVE:   Dilation: Closed Effacement (%): Thick Station: -3 Exam by:: Bertram Millard, rn  Labs: Lab Results  Component Value Date   WBC 10.3 04/06/2014   HGB 11.3* 04/06/2014   HCT 34.7* 04/06/2014   MCV 84.8 04/06/2014   PLT 283 04/06/2014    Assessment / Plan: Begin IOL process GHTN  Cytotec to be placed shortly; IV Lab prn 160/105 CBC/CMET essential nl for preeclampsia eval; urine P/C ratio pending  Ashley Grant 04/06/2014, 9:17 PM

## 2014-04-06 NOTE — MAU Note (Signed)
Patient was called and told to come to [facility] due to her elevated blood pressures. Patient is to be an admission for IOL to [facility]. Patient denies any headaches or visual problems at this time. Reports irregular contractions, no bleeding or leaking and good fetal movement. Has increasing swelling in lower legs and feet.

## 2014-04-06 NOTE — H&P (Signed)
Nary Dembek is a 33 y.o. female G1P0 at [redacted]w[redacted]d who presents to labor and delivery for induction of labor due to [facility]. Pt has been getting prenatal care with the health department. Had her last prenatal visit yesterday. Pt was called today and told [facility] due to high blood pressures. In the MAU pts BPs were 140s/100s x2. Pt has had an uncomplicated pregnancy up to this point and denies an abdominal pain, vaginal bleeding, vision changes, headaches, fever, n/v. She does report significant lower extremity edema x1 month. Pt denies any medical conditions previous to pregnancy including diabetes, HTN, tobacco abuse, and does not take any medication.    Maternal Medical History:  Reason for admission: Nausea.    OB History   Grav Para Term Preterm Abortions TAB SAB Ect Mult Living   1              History reviewed. No pertinent past medical history. History reviewed. No pertinent past surgical history. Family History: family history includes Cancer in her paternal grandfather; Diabetes in her father and maternal grandfather. Social History:  reports that she has never smoked. She does not have any smokeless tobacco history on file. She reports that she does not drink alcohol or use illicit drugs.   Prenatal Transfer Tool  Maternal Diabetes: No Genetic Screening: Normal Maternal Ultrasounds/Referrals: Abnormal:  Findings:   Other: Large for dates 03/15/14 71% Fetal Ultrasounds or other Referrals:  None Maternal Substance Abuse:  No Significant Maternal Medications:  None Significant Maternal Lab Results:  None Other Comments:  None  Review of Systems  Constitutional: Negative for fever, chills and malaise/fatigue.  Eyes: Negative for blurred vision, double vision and photophobia.  Respiratory: Negative for cough and shortness of breath.   Cardiovascular: Negative for chest pain.  Gastrointestinal: Negative for nausea, vomiting and  abdominal pain.  Neurological: Negative for dizziness, weakness and headaches.  All other systems reviewed and are negative.     Blood pressure 142/101, pulse 96, temperature 98.2 F (36.8 C), temperature source Oral, resp. rate 20, height 5\' 5"  (1.651 m), weight 120.385 kg (265 lb 6.4 oz), last menstrual period 05/12/2013, SpO2 98.00%. Exam Physical Exam  Constitutional: She is oriented to person, place, and time. She appears well-developed and well-nourished.  HENT:  Head: Normocephalic and atraumatic.  Cardiovascular: Normal rate and regular rhythm.   Respiratory: Effort normal and breath sounds normal.  GI: There is no tenderness. There is no guarding.  Neurological: She is alert and oriented to person, place, and time.  Skin: Skin is warm and dry.   Exremities: 3+ pitting edema of lower extremities up to the knees bilaterally Cervix: Closed and thick  Prenatal labs: ABO, Rh: O/Positive/-- (11/10 0000) Antibody: Negative (11/10 0000) Rubella: Immune (11/10 0000) RPR: Nonreactive (03/17 0000)  HBsAg: Negative (11/10 0000)  HIV: Non-reactive (11/10 0000)  GBS: Negative (05/04 0000)   Assessment/Plan:  Induction of Labor, GHTN, Unfavorable Cervix 1. Cytotec initiated 2. Foley bulb when able 3. Continuous fetal monitoring 4. PIH labs pending     Lawernce Pitts 04/06/2014, 8:19 PM  I have seen and examined this patient and I agree with the above. PIH labs neg; urine P/C ratio: 0.10. Will watch BPs closely and tx as needed for 160/100. Arabella Merles CNM 1:15 AM 04/07/2014

## 2014-04-07 LAB — RPR

## 2014-04-07 MED ORDER — MISOPROSTOL 25 MCG QUARTER TABLET
50.0000 ug | ORAL_TABLET | ORAL | Status: DC
  Filled 2014-04-07: qty 0.5

## 2014-04-07 MED ORDER — PHENYLEPHRINE 40 MCG/ML (10ML) SYRINGE FOR IV PUSH (FOR BLOOD PRESSURE SUPPORT)
80.0000 ug | PREFILLED_SYRINGE | INTRAVENOUS | Status: DC | PRN
  Filled 2014-04-07 (×2): qty 10

## 2014-04-07 MED ORDER — FENTANYL CITRATE 0.05 MG/ML IJ SOLN
100.0000 ug | INTRAMUSCULAR | Status: DC | PRN
  Administered 2014-04-07: 100 ug via INTRAVENOUS
  Filled 2014-04-07: qty 2

## 2014-04-07 MED ORDER — DIPHENHYDRAMINE HCL 50 MG/ML IJ SOLN: 12.5000 mg | INTRAMUSCULAR | Status: DC | PRN

## 2014-04-07 MED ORDER — CALCIUM CARBONATE ANTACID 500 MG PO CHEW
2.0000 | CHEWABLE_TABLET | Freq: Two times a day (BID) | ORAL | Status: DC | PRN
  Administered 2014-04-07: 400 mg via ORAL
  Filled 2014-04-07: qty 1

## 2014-04-07 MED ORDER — TERBUTALINE SULFATE 1 MG/ML IJ SOLN: 0.2500 mg | Freq: Once | INTRAMUSCULAR | Status: AC | PRN

## 2014-04-07 MED ORDER — LACTATED RINGERS IV SOLN
500.0000 mL | Freq: Once | INTRAVENOUS | Status: AC
  Administered 2014-04-08: 500 mL via INTRAVENOUS

## 2014-04-07 MED ORDER — EPHEDRINE 5 MG/ML INJ
10.0000 mg | INTRAVENOUS | Status: DC | PRN
  Filled 2014-04-07 (×2): qty 4

## 2014-04-07 MED ORDER — FENTANYL 2.5 MCG/ML BUPIVACAINE 1/10 % EPIDURAL INFUSION (WH - ANES)
14.0000 mL/h | INTRAMUSCULAR | Status: DC | PRN
  Administered 2014-04-08 (×3): 14 mL/h via EPIDURAL
  Filled 2014-04-07 (×3): qty 125

## 2014-04-07 MED ORDER — OXYTOCIN 40 UNITS IN LACTATED RINGERS INFUSION - SIMPLE MED
1.0000 m[IU]/min | INTRAVENOUS | Status: DC
  Administered 2014-04-07: 4 m[IU]/min via INTRAVENOUS
  Administered 2014-04-07: 2 m[IU]/min via INTRAVENOUS
  Filled 2014-04-07: qty 1000

## 2014-04-07 MED ORDER — PHENYLEPHRINE 40 MCG/ML (10ML) SYRINGE FOR IV PUSH (FOR BLOOD PRESSURE SUPPORT): 80.0000 ug | PREFILLED_SYRINGE | INTRAVENOUS | Status: DC | PRN

## 2014-04-07 MED ORDER — EPHEDRINE 5 MG/ML INJ: 10.0000 mg | INTRAVENOUS | Status: DC | PRN

## 2014-04-07 MED ORDER — FENTANYL CITRATE 0.05 MG/ML IJ SOLN: 100.0000 ug | INTRAMUSCULAR | Status: DC | PRN

## 2014-04-07 NOTE — Progress Notes (Signed)
Ashley Grant is a 33 y.o. G1P0 at [redacted]w[redacted]d   Subjective: Pt complains of being hungry. Rec'd Cytotec x3 no other complaints at this time.  Objective: BP 130/82  Pulse 80  Temp(Src) 98.1 F (36.7 C) (Oral)  Resp 18  Ht 5\' 5"  (1.651 m)  Wt 120.385 kg (265 lb 6.4 oz)  BMI 44.16 kg/m2  SpO2 98%  LMP 05/12/2013      FHT:  FHR: 130s bpm, variability: moderate,  accelerations:  Present,  decelerations:  Absent UC:   Irreg, q2-38min SVE:   Dilation: Closed Effacement (%): Thick Station: -3 Exam by:: Philipp Deputy  Labs: Lab Results  Component Value Date   WBC 10.3 04/06/2014   HGB 11.3* 04/06/2014   HCT 34.7* 04/06/2014   MCV 84.8 04/06/2014   PLT 283 04/06/2014    Assessment / Plan: Begin IOL - rec'd cytotec x3, will attempt FB at next check if able GHTN - not requiring mag. Stable labs. IV Lab prn 160/105 ID: GBS Neg FWB: Cat I Pain: not requiring meds at this time.  [name] 04/07/2014, 9:57 AM

## 2014-04-07 NOTE — Progress Notes (Signed)
Ashley Grant is a 33 y.o. G1P0 at [redacted]w[redacted]d   Subjective: Pt complains of being hungry. Rec'd Cytotec x3, no complaints  Objective: BP 138/91  Pulse 85  Temp(Src) 98.4 F (36.9 C) (Oral)  Resp 18  Ht 5\' 5"  (1.651 m)  Wt 120.385 kg (265 lb 6.4 oz)  BMI 44.16 kg/m2  SpO2 98%  LMP 05/12/2013      Filed Vitals:   04/07/14 0730 04/07/14 0915 04/07/14 1026 04/07/14 1138  BP: 135/84 130/82 130/95 138/91  Pulse: 82 80 79 85  Temp: 98.1 F (36.7 C)   98.4 F (36.9 C)  TempSrc: Oral   Oral  Resp: 18 18 18 18   Height:      Weight:      SpO2:         FHT:  FHR: 120s bpm, variability: moderate,  accelerations:  Present,  decelerations:  Absent UC:   Irreg, q2-68min SVE:   Dilation: 3 Effacement (%): 30 Station: -2 Exam by:: Dr. Ike Grant  Labs: Lab Results  Component Value Date   WBC 10.3 04/06/2014   HGB 11.3* 04/06/2014   HCT 34.7* 04/06/2014   MCV 84.8 04/06/2014   PLT 283 04/06/2014    Assessment / Plan: Begin IOL - rec'd cytotec x3, FB Placed GHTN - not requiring mag. Stable labs. IV Lab prn 160/105 ID: GBS Neg FWB: Cat I Pain: not requiring meds at this time. But will give PRN  Ashley Grant 04/07/2014, 1:15 PM

## 2014-04-08 ENCOUNTER — Encounter (HOSPITAL_COMMUNITY)
Admission: AD | Disposition: A | Discharge status: Home / Self Care | Payer: Self-pay | Source: Ambulatory Visit | Attending: Obstetrics & Gynecology

## 2014-04-08 ENCOUNTER — Inpatient Hospital Stay (HOSPITAL_COMMUNITY): Payer: Medicaid Other | Admitting: Anesthesiology

## 2014-04-08 ENCOUNTER — Encounter (HOSPITAL_COMMUNITY): Payer: Self-pay | Admitting: Anesthesiology

## 2014-04-08 ENCOUNTER — Encounter (HOSPITAL_COMMUNITY): Payer: Medicaid Other | Admitting: Anesthesiology

## 2014-04-08 LAB — CBC
HCT: 33.6 % — ABNORMAL LOW (ref 36.0–46.0)
HCT: 30.5 % — ABNORMAL LOW (ref 36.0–46.0)
Hemoglobin: 11.1 g/dL — ABNORMAL LOW (ref 12.0–15.0)
Hemoglobin: 10 g/dL — ABNORMAL LOW (ref 12.0–15.0)
MCH: 28 pg (ref 26.0–34.0)
MCH: 28.1 pg (ref 26.0–34.0)
MCHC: 33 g/dL (ref 30.0–36.0)
MCHC: 32.8 g/dL (ref 30.0–36.0)
MCV: 84.8 fL (ref 78.0–100.0)
MCV: 85.7 fL (ref 78.0–100.0)
Platelets: 253 10*3/uL (ref 150–400)
Platelets: 229 10*3/uL (ref 150–400)
RBC: 3.96 MIL/uL (ref 3.87–5.11)
RBC: 3.56 MIL/uL — ABNORMAL LOW (ref 3.87–5.11)
RDW: 14.6 % (ref 11.5–15.5)
RDW: 14.9 % (ref 11.5–15.5)
WBC: 11.1 10*3/uL — AB (ref 4.0–10.5)
WBC: 18.9 10*3/uL — ABNORMAL HIGH (ref 4.0–10.5)

## 2014-04-08 SURGERY — Surgical Case
Anesthesia: Epidural

## 2014-04-08 MED ORDER — OXYTOCIN 10 UNIT/ML IJ SOLN
INTRAMUSCULAR | Status: AC
  Filled 2014-04-08: qty 4

## 2014-04-08 MED ORDER — MORPHINE SULFATE 0.5 MG/ML IJ SOLN
INTRAMUSCULAR | Status: AC
  Filled 2014-04-08: qty 10

## 2014-04-08 MED ORDER — SODIUM BICARBONATE 8.4 % IV SOLN
INTRAVENOUS | Status: AC
  Filled 2014-04-08: qty 50

## 2014-04-08 MED ORDER — OXYTOCIN 10 UNIT/ML IJ SOLN
40.0000 [IU] | INTRAVENOUS | Status: DC | PRN
  Administered 2014-04-08: 40 [IU] via INTRAVENOUS

## 2014-04-08 MED ORDER — PROMETHAZINE HCL 25 MG/ML IJ SOLN: 6.2500 mg | INTRAMUSCULAR | Status: DC | PRN

## 2014-04-08 MED ORDER — DEXTROSE 5 % IV SOLN
3.0000 g | Freq: Once | INTRAVENOUS | Status: AC
  Administered 2014-04-08: 3 g via INTRAVENOUS
  Filled 2014-04-08: qty 3000

## 2014-04-08 MED ORDER — SODIUM BICARBONATE 8.4 % IV SOLN
INTRAVENOUS | Status: DC | PRN
  Administered 2014-04-08: 5 mL via EPIDURAL
  Administered 2014-04-08: 10 mL via EPIDURAL
  Administered 2014-04-08: 5 mL via EPIDURAL

## 2014-04-08 MED ORDER — DEXAMETHASONE SODIUM PHOSPHATE 10 MG/ML IJ SOLN
INTRAMUSCULAR | Status: AC
  Filled 2014-04-08: qty 1

## 2014-04-08 MED ORDER — LIDOCAINE-EPINEPHRINE (PF) 2 %-1:200000 IJ SOLN
INTRAMUSCULAR | Status: AC
  Filled 2014-04-08: qty 20

## 2014-04-08 MED ORDER — FENTANYL CITRATE 0.05 MG/ML IJ SOLN
25.0000 ug | INTRAMUSCULAR | Status: DC | PRN
Start: 2014-04-08 — End: 2014-04-09
  Administered 2014-04-08 (×3): 50 ug via INTRAVENOUS

## 2014-04-08 MED ORDER — MORPHINE SULFATE (PF) 0.5 MG/ML IJ SOLN
INTRAMUSCULAR | Status: DC | PRN
  Administered 2014-04-08: 4 mg via EPIDURAL

## 2014-04-08 MED ORDER — FENTANYL CITRATE 0.05 MG/ML IJ SOLN
INTRAMUSCULAR | Status: AC
  Filled 2014-04-08: qty 2

## 2014-04-08 MED ORDER — DEXTROSE 5 % IV SOLN
INTRAVENOUS | Status: AC
  Filled 2014-04-08: qty 3000

## 2014-04-08 MED ORDER — DEXAMETHASONE SODIUM PHOSPHATE 10 MG/ML IJ SOLN
INTRAMUSCULAR | Status: DC | PRN
Start: 2014-04-08 — End: 2014-04-08
  Administered 2014-04-08: 10 mg via INTRAVENOUS

## 2014-04-08 MED ORDER — SCOPOLAMINE 1 MG/3DAYS TD PT72
MEDICATED_PATCH | TRANSDERMAL | Status: AC
Start: 2014-04-08 — End: 2014-04-09
  Filled 2014-04-08: qty 1

## 2014-04-08 MED ORDER — CARBOPROST TROMETHAMINE 250 MCG/ML IM SOLN
INTRAMUSCULAR | Status: DC | PRN
  Administered 2014-04-08: 250 ug via INTRAMUSCULAR

## 2014-04-08 MED ORDER — FENTANYL CITRATE 0.05 MG/ML IJ SOLN
INTRAMUSCULAR | Status: AC
  Administered 2014-04-08: 50 ug via INTRAVENOUS
  Filled 2014-04-08: qty 2

## 2014-04-08 MED ORDER — LIDOCAINE HCL (PF) 1 % IJ SOLN
INTRAMUSCULAR | Status: DC | PRN
  Administered 2014-04-08 (×4): 4 mL

## 2014-04-08 MED ORDER — KETOROLAC TROMETHAMINE 30 MG/ML IJ SOLN: 30.0000 mg | Freq: Four times a day (QID) | INTRAMUSCULAR | Status: DC | PRN

## 2014-04-08 MED ORDER — FENTANYL CITRATE 0.05 MG/ML IJ SOLN
INTRAMUSCULAR | Status: DC | PRN
Start: 2014-04-08 — End: 2014-04-08
  Administered 2014-04-08: 100 ug via INTRAVENOUS

## 2014-04-08 MED ORDER — ONDANSETRON HCL 4 MG/2ML IJ SOLN
INTRAMUSCULAR | Status: AC
  Filled 2014-04-08: qty 2

## 2014-04-08 MED ORDER — KETOROLAC TROMETHAMINE 30 MG/ML IJ SOLN
INTRAMUSCULAR | Status: AC
  Administered 2014-04-08: 30 mg via INTRAVENOUS
  Filled 2014-04-08: qty 1

## 2014-04-08 MED ORDER — MIDAZOLAM HCL 2 MG/2ML IJ SOLN: 0.5000 mg | Freq: Once | INTRAMUSCULAR | Status: AC | PRN

## 2014-04-08 MED ORDER — MEPERIDINE HCL 25 MG/ML IJ SOLN: 6.2500 mg | INTRAMUSCULAR | Status: DC | PRN

## 2014-04-08 MED ORDER — CEFAZOLIN SODIUM-DEXTROSE 2-3 GM-% IV SOLR
2.0000 g | Freq: Once | INTRAVENOUS | Status: DC
  Filled 2014-04-08: qty 50

## 2014-04-08 MED ORDER — LACTATED RINGERS IV SOLN
INTRAVENOUS | Status: DC | PRN
  Administered 2014-04-08: 21:00:00 via INTRAVENOUS

## 2014-04-08 MED ORDER — MEPERIDINE HCL 25 MG/ML IJ SOLN
INTRAMUSCULAR | Status: DC | PRN
  Administered 2014-04-08: 25 mg via INTRAVENOUS

## 2014-04-08 MED ORDER — MEPERIDINE HCL 25 MG/ML IJ SOLN
INTRAMUSCULAR | Status: AC
  Filled 2014-04-08: qty 1

## 2014-04-08 MED ORDER — SCOPOLAMINE 1 MG/3DAYS TD PT72: 1.0000 | MEDICATED_PATCH | Freq: Once | TRANSDERMAL | Status: DC

## 2014-04-08 MED ORDER — MORPHINE SULFATE (PF) 0.5 MG/ML IJ SOLN
INTRAMUSCULAR | Status: DC | PRN
  Administered 2014-04-08: 1 mg via INTRAVENOUS

## 2014-04-08 MED ORDER — KETOROLAC TROMETHAMINE 30 MG/ML IJ SOLN
30.0000 mg | Freq: Four times a day (QID) | INTRAMUSCULAR | Status: DC | PRN
  Administered 2014-04-08: 30 mg via INTRAVENOUS

## 2014-04-08 SURGICAL SUPPLY — 33 items
BARRIER ADHS 3X4 INTERCEED (GAUZE/BANDAGES/DRESSINGS) IMPLANT
BENZOIN TINCTURE PRP APPL 2/3 (GAUZE/BANDAGES/DRESSINGS) ×3 IMPLANT
CLAMP CORD UMBIL (MISCELLANEOUS) IMPLANT
CLOSURE WOUND 1/2 X4 (GAUZE/BANDAGES/DRESSINGS) ×1
CLOTH BEACON ORANGE TIMEOUT ST (SAFETY) ×3 IMPLANT
DRAPE LG THREE QUARTER DISP (DRAPES) IMPLANT
DRSG OPSITE POSTOP 4X10 (GAUZE/BANDAGES/DRESSINGS) ×3 IMPLANT
DURAPREP 26ML APPLICATOR (WOUND CARE) ×3 IMPLANT
ELECT REM PT RETURN 9FT ADLT (ELECTROSURGICAL) ×3
ELECTRODE REM PT RTRN 9FT ADLT (ELECTROSURGICAL) ×1 IMPLANT
EXTRACTOR VACUUM KIWI (MISCELLANEOUS) IMPLANT
GLOVE BIO SURGEON STRL SZ 6.5 (GLOVE) ×2 IMPLANT
GLOVE BIO SURGEONS STRL SZ 6.5 (GLOVE) ×1
GLOVE BIOGEL PI IND STRL 7.0 (GLOVE) ×1 IMPLANT
GLOVE BIOGEL PI INDICATOR 7.0 (GLOVE) ×2
GOWN STRL REUS W/TWL LRG LVL3 (GOWN DISPOSABLE) ×6 IMPLANT
KIT ABG SYR 3ML LUER SLIP (SYRINGE) IMPLANT
NEEDLE HYPO 25X5/8 SAFETYGLIDE (NEEDLE) IMPLANT
NS IRRIG 1000ML POUR BTL (IV SOLUTION) ×3 IMPLANT
PACK C SECTION WH (CUSTOM PROCEDURE TRAY) ×3 IMPLANT
PAD ABD 8X7 1/2 STERILE (GAUZE/BANDAGES/DRESSINGS) ×3 IMPLANT
PAD OB MATERNITY 4.3X12.25 (PERSONAL CARE ITEMS) ×3 IMPLANT
SPONGE GAUZE 4X4 12PLY (GAUZE/BANDAGES/DRESSINGS) ×3 IMPLANT
STRIP CLOSURE SKIN 1/2X4 (GAUZE/BANDAGES/DRESSINGS) ×2 IMPLANT
SUT PLAIN 2 0 XLH (SUTURE) ×3 IMPLANT
SUT VIC AB 0 CT1 36 (SUTURE) ×18 IMPLANT
SUT VIC AB 2-0 CT1 27 (SUTURE) ×2
SUT VIC AB 2-0 CT1 TAPERPNT 27 (SUTURE) ×1 IMPLANT
SUT VIC AB 4-0 KS 27 (SUTURE) ×3 IMPLANT
SUT VIC AB 4-0 PS2 27 (SUTURE) ×3 IMPLANT
TOWEL OR 17X24 6PK STRL BLUE (TOWEL DISPOSABLE) ×3 IMPLANT
TRAY FOLEY CATH 14FR (SET/KITS/TRAYS/PACK) IMPLANT
WATER STERILE IRR 1000ML POUR (IV SOLUTION) ×3 IMPLANT

## 2014-04-08 NOTE — Transfer of Care (Signed)
Immediate Anesthesia Transfer of Care Note  Patient: Ashley Grant  Procedure(s) Performed: Procedure(s): CESAREAN SECTION (N/A)  Patient Location: PACU  Anesthesia Type:Epidural  Level of Consciousness: awake  Airway & Oxygen Therapy: Patient Spontanous Breathing  Post-op Assessment: Report given to PACU RN and Post -op Vital signs reviewed and stable  Post vital signs: stable  Complications: No apparent anesthesia complications

## 2014-04-08 NOTE — Progress Notes (Signed)
Ashley Grant is a 33 y.o. G1P0 at [redacted]w[redacted]d admitted for induction of labor due to Hypertension.  Subjective:  Pt is frustrated and wants to give up. Not feeling any more pressure. +FM.   Objective: BP 133/88  Pulse 99  Temp(Src) 98.2 F (36.8 C) (Oral)  Resp 18  Ht 5\' 5"  (1.651 m)  Wt 120.385 kg (265 lb 6.4 oz)  BMI 44.16 kg/m2  SpO2 98%  LMP 05/12/2013      FHT:  FHR: 145 bpm, variability: moderate,  accelerations:  Present,  decelerations:  Absent UC:   Irregular.  MVUs 110-180 SVE:   Dilation: 7 Effacement (%): 80 Station: -1 Exam by:: Dr. Reola Calkins  Labs: Lab Results  Component Value Date   WBC 11.1* 04/08/2014   HGB 11.1* 04/08/2014   HCT 33.6* 04/08/2014   MCV 84.8 04/08/2014   PLT 253 04/08/2014    Assessment / Plan: arrest of labor  Labor: still without ability to maintaina  good contraction pattern or adequate contractions  Fetal Wellbeing:  Category I Pain Control:  Epidural I/D:  n/a  Given now 14 hours of same dilatation and inability to achieve adequate contractions based on MVUs or pattern, a decision was made to proceed with cesarean delivery for failed induction due to failed dilatation. She has been on clear liquids for 2 days.    The risks of cesarean section discussed with the patient included but were not limited to: bleeding which may require transfusion or reoperation; infection which may require antibiotics; injury to bowel, bladder, ureters or other surrounding organs; injury to the fetus; need for additional procedures including hysterectomy in the event of a life-threatening hemorrhage; placental abnormalities wth subsequent pregnancies, incisional problems, thromboembolic phenomenon and other postoperative/anesthesia complications. The patient concurred with the proposed plan, giving informed written consent for the procedure.   Patient has been NPO since 2 days ago she will remain NPO for procedure. Anesthesia and OR aware. Preoperative  prophylactic antibiotics and SCDs ordered on call to the OR.  To OR when ready.     Talin Feister L Darrell Leonhardt 04/08/2014, 8:45 PM

## 2014-04-08 NOTE — Consult Note (Signed)
Neonatology Note:  Attendance at C-section:  I was asked by Dr. Arnold to attend this primary C/S at term due to FTP. The mother is a G1P0 O pos, GBS neg with gestational HTN. ROM 16 hours prior to delivery, fluid with thin meconium. Infant vigorous with good spontaneous cry and tone. Needed only minimal bulb suctioning. Ap 9/9. Lungs clear to ausc in DR. To CN to care of Pediatrician.  Ashley Grant C. Ashley Bowie, MD  

## 2014-04-08 NOTE — Anesthesia Procedure Notes (Signed)
Epidural Patient location during procedure: OB  Staffing Performed by: anesthesiologist   Preanesthetic Checklist Completed: patient identified, site marked, surgical consent, pre-op evaluation, timeout performed, IV checked, risks and benefits discussed and monitors and equipment checked  Epidural Patient position: sitting Prep: site prepped and draped and DuraPrep Patient monitoring: continuous pulse ox and blood pressure Approach: midline Injection technique: LOR air  Needle:  Needle type: Tuohy  Needle gauge: 17 G Needle length: 9 cm and 9 Needle insertion depth: 7 cm Catheter type: closed end flexible Catheter size: 19 Gauge Catheter at skin depth: 12 cm Test dose: negative  Assessment Events: blood not aspirated, injection not painful, no injection resistance, negative IV test and no paresthesia  Additional Notes Discussed risk of headache, infection, bleeding, nerve injury and failed or incomplete block.  Patient voices understanding and wishes to proceed.  Epidural placed easily on first attempt.  No paresthesia.  Patient tolerated procedure well with no apparent complications.  Jasmine December, MD

## 2014-04-08 NOTE — Progress Notes (Signed)
Ashley Grant is a 33 y.o. G1P0 at [redacted]w[redacted]d   Subjective: Sleeping. Rec'd Cytotec x3, on pit not feeling contractions. no complaints  Objective: BP 128/87  Pulse 77  Temp(Src) 98.1 F (36.7 C) (Oral)  Resp 20  Ht 5\' 5"  (1.651 m)  Wt 120.385 kg (265 lb 6.4 oz)  BMI 44.16 kg/m2  SpO2 98%  LMP 05/12/2013      Filed Vitals:   04/07/14 2305 04/07/14 2330 04/08/14 0000 04/08/14 0030  BP: 136/89 148/100 134/82 128/87  Pulse: 77 81 77 77  Temp:   98.1 F (36.7 C)   TempSrc:   Oral   Resp: 20 20 20 20   Height:      Weight:      SpO2:         FHT:  FHR: 120s bpm, variability: moderate,  accelerations:  Present,  decelerations:  Absent UC:   Irreg, q2-79min SVE:   Dilation: 4 Effacement (%): 30 Station: -3 Exam by:: Dr. Ike Bene  Labs: Lab Results  Component Value Date   WBC 10.3 04/06/2014   HGB 11.3* 04/06/2014   HCT 34.7* 04/06/2014   MCV 84.8 04/06/2014   PLT 283 04/06/2014    Assessment / Plan: Begin IOL - s/p cytotec, FB and now on pitocin GHTN - not requiring mag. Stable labs. IV Lab prn 160/105 ID: GBS Neg FWB: Cat I Pain: not requiring meds at this time. But will give PRN  [name] 04/08/2014, 12:49 AM

## 2014-04-08 NOTE — Op Note (Signed)
Ashley Grant PROCEDURE DATE: 04/06/2014 - 04/08/2014  PREOPERATIVE DIAGNOSES: Intrauterine pregnancy at  [redacted]w[redacted]d weeks gestation; failed induction and failure to progress: arrest of dilation  POSTOPERATIVE DIAGNOSES: The same  PROCEDURE: Primary Low Transverse Cesarean Section  SURGEON:  Dr. Scheryl Darter  ASSISTANT:  Dr. Rulon Abide  ANESTHESIOLOGIST: Dr. Brayton Caves  INDICATIONS: Ashley Grant is a 33 y.o. G1P0 at [redacted]w[redacted]d here for cesarean section secondary to the indications listed under preoperative diagnoses; please see preoperative note for further details.  Briefly, pt has been here for over 48 hours as  an induction for gHTN and has remained persistently 6-7 cm for the last 12 hours with inability to obtain adequate contractions. A decision was made to proceed with cesarean for failed induction.  The risks of cesarean section were discussed with the patient including but were not limited to: bleeding which may require transfusion or reoperation; infection which may require antibiotics; injury to bowel, bladder, ureters or other surrounding organs; injury to the fetus; need for additional procedures including hysterectomy in the event of a life-threatening hemorrhage; placental abnormalities wth subsequent pregnancies, incisional problems, thromboembolic phenomenon and other postoperative/anesthesia complications.   The patient concurred with the proposed plan, giving informed written consent for the procedure.    FINDINGS:  Viable female infant in cephalic presentation.  Apgars 9 and 9.  Thick meconium amniotic fluid.  Intact placenta, three vessel cord.  Normal uterus, fallopian tubes and ovaries bilaterally.  ANESTHESIA: Epidural INTRAVENOUS FLUIDS: 1000 ml ESTIMATED BLOOD LOSS: 700 ml URINE OUTPUT:  500 ml SPECIMENS: Placenta sent to L&D COMPLICATIONS: None immediate  PROCEDURE IN DETAIL:  The patient preoperatively received intravenous antibiotics and had sequential  compression devices applied to her lower extremities.  She was then taken to the operating room where the epidural anesthesia was dosed up to surgical level and was found to be adequate. She was then placed in a dorsal supine position with a leftward tilt, and prepped and draped in a sterile manner.  A foley catheter was placed into her bladder and attached to constant gravity.  After an adequate timeout was performed, a Pfannenstiel skin incision was made with scalpel and carried through to the underlying layer of fascia. The fascia was incised in the midline, and this incision was extended bilaterally using the Mayo scissors.  Kocher clamps were applied to the superior aspect of the fascial incision and the underlying rectus muscles were dissected off bluntly. A similar process was carried out on the inferior aspect of the fascial incision. The rectus muscles were separated in the midline bluntly and the peritoneum was entered bluntly. Attention was turned to the lower uterine segment where a low transverse hysterotomy was made with a scalpel and extended bilaterally bluntly.  The infant was successfully delivered, the cord was clamped and cut and the infant was handed over to awaiting neonatology team. Uterine massage was then administered, and the placenta delivered intact with a three-vessel cord. The uterus was then cleared of clot and debris.  The hysterotomy was closed with 0 Vicryl in a running locked fashion, and an imbricating layer was also placed with 0 Vicryl. The pelvis was cleared of all clot and debris. Hemostasis was confirmed on all surfaces.  The peritoneum and the muscles were reapproximated using a 2-0 Vicryl interrupted stitch. The fascia was then closed using 0 Vicryl in a running fashion.  The subcutaneous layer was irrigated, then reapproximated with 2-0 plain gut interrupted stitches.  The skin was closed with a 4-0  Vicryl subcuticular stitch. The patient tolerated the procedure well.  Sponge, lap, instrument and needle counts were correct x 2.  She was taken to the recovery room in stable condition.   Ashley HavenKeli L Vick Filter, MD

## 2014-04-08 NOTE — Anesthesia Preprocedure Evaluation (Addendum)
Anesthesia Evaluation  Patient identified by MRN, date of birth, ID band Patient awake    Reviewed: Allergy & Precautions, H&P , NPO status , Patient's Chart, lab work & pertinent test results, reviewed documented beta blocker date and time   History of Anesthesia Complications Negative for: history of anesthetic complications  Airway Mallampati: III TM Distance: >3 FB Neck ROM: full    Dental  (+) Teeth Intact   Pulmonary neg pulmonary ROS,  breath sounds clear to auscultation        Cardiovascular hypertension (GHTN), Rhythm:regular Rate:Normal     Neuro/Psych negative neurological ROS  negative psych ROS   GI/Hepatic negative GI ROS, Neg liver ROS,   Endo/Other  Morbid obesity  Renal/GU negative Renal ROS     Musculoskeletal   Abdominal   Peds  Hematology negative hematology ROS (+)   Anesthesia Other Findings   Reproductive/Obstetrics (+) Pregnancy                          Anesthesia Physical Anesthesia Plan  ASA: III  Anesthesia Plan: Epidural   Post-op Pain Management:    Induction:   Airway Management Planned:   Additional Equipment:   Intra-op Plan:   Post-operative Plan:   Informed Consent: I have reviewed the patients History and Physical, chart, labs and discussed the procedure including the risks, benefits and alternatives for the proposed anesthesia with the patient or authorized representative who has indicated his/her understanding and acceptance.     Plan Discussed with:   Anesthesia Plan Comments:         Anesthesia Quick Evaluation

## 2014-04-08 NOTE — Progress Notes (Signed)
Ashley Grant is a 33 y.o. G1P0 at [redacted]w[redacted]d admitted for induction of labor due to Hypertension.  Subjective: Epidural in place. Feeling increased pressure.   Objective: BP 136/91  Pulse 91  Temp(Src) 97.9 F (36.6 C) (Oral)  Resp 18  Ht 5\' 5"  (1.651 m)  Wt 120.385 kg (265 lb 6.4 oz)  BMI 44.16 kg/m2  SpO2 98%  LMP 05/12/2013      FHT:  FHR: 125 bpm, variability: moderate,  accelerations:  Present,  decelerations:  Absent UC:   regular, every 2-4 minutes SVE: Dilation 7.5 Effacement (%): 100  Station: 0,-1 Exam by: Dr. Jordan Likes   Labs: Lab Results  Component Value Date   WBC 11.1* 04/08/2014   HGB 11.1* 04/08/2014   HCT 33.6* 04/08/2014   MCV 84.8 04/08/2014   PLT 253 04/08/2014    Assessment / Plan: Induction of labor due to gestational hypertension,  progressing well on pitocin. s/p AROM with moderate mec  GHTN: BP 115-130's/ 70-90's. Not requiring mag at this time. Labetalol PRN 160/105   Labor: prgressing on pit, IUPC just placed  Preeclampsia:  n/a Fetal Wellbeing:  Category I Pain Control:  Epidural I/D:  n/a Anticipated MOD:  NSVD  [name] 04/08/2014, 10:41 AM

## 2014-04-08 NOTE — Anesthesia Postprocedure Evaluation (Signed)
Anesthesia Post Note  Patient: Ashley Grant  Procedure(s) Performed: Procedure(s) (LRB): CESAREAN SECTION (N/A)  Anesthesia type: Epidural  Patient location: PACU  Post pain: Pain level controlled  Post assessment: Post-op Vital signs reviewed  Last Vitals:  Filed Vitals:   04/08/14 2315  BP: 146/92  Pulse: 97  Temp:   Resp: 21    Post vital signs: Reviewed  Level of consciousness: awake  Complications: No apparent anesthesia complications

## 2014-04-08 NOTE — Progress Notes (Signed)
Ashley Grant is a 33 y.o. G1P0 at [redacted]w[redacted]d admitted for induction of labor due to Hypertension.  Subjective: Pt is getting tired and frustrated with how long this is taking.  Having some pressure. +FM.    Objective: BP 148/99  Pulse 96  Temp(Src) 98.1 F (36.7 C) (Oral)  Resp 18  Ht 5\' 5"  (1.651 m)  Wt 120.385 kg (265 lb 6.4 oz)  BMI 44.16 kg/m2  SpO2 98%  LMP 05/12/2013      FHT:  FHR: 145 bpm, variability: moderate,  accelerations:  Present,  decelerations:  Present occasional variable UC:   irregular, every 1-5 minutes, MVUs of 150-170 SVE:   Dilation: 9 Effacement (%): 100 Station: +1 Exam by:: S Nix RN  Labs: Lab Results  Component Value Date   WBC 11.1* 04/08/2014   HGB 11.1* 04/08/2014   HCT 33.6* 04/08/2014   MCV 84.8 04/08/2014   PLT 253 04/08/2014    Assessment / Plan: Protracted active phase  Labor:  evaluated pt as has had inadequate MVUs for most of the day since the IUPC was placed but was still making slow cervical change per recent examiners. However, on my check she remains 6-7/90/0.  Her dilation is unchanged since this AM although her station has improved.  contraction pattern is very irregular and inadequate. discussed will pit rest for the next 1.5 hours and then restart. if still unable to get her adequate or acheive cervical dilatation then will need to discuss cesarean delivery.   Fetal Wellbeing:  Category I Pain Control:  Epidural I/D:  n/a Anticipated MOD:  Unclear. Pending whether her contractions can be made to be adequate and cervical dilation can be achieved.   Eathen Budreau L Stasia Somero 04/08/2014, 5:03 PM

## 2014-04-08 NOTE — Progress Notes (Signed)
Ashley Grant is a 33 y.o. G1P0 at [redacted]w[redacted]d   Subjective: Pt was having more painful ctx, had epidural placed. Now comfortable  Objective: BP 139/84  Pulse 88  Temp(Src) 98.3 F (36.8 C) (Oral)  Resp 20  Ht 5\' 5"  (1.651 m)  Wt 120.385 kg (265 lb 6.4 oz)  BMI 44.16 kg/m2  SpO2 98%  LMP 05/12/2013      Filed Vitals:   04/08/14 0514 04/08/14 0519 04/08/14 0524 04/08/14 0530  BP:    139/84  Pulse: 78 77 77 88  Temp:      TempSrc:      Resp:    20  Height:      Weight:      SpO2:         FHT:  FHR: 120s bpm, variability: moderate,  accelerations:  Present,  decelerations:  1 early UC:  reg, q2-76min SVE:   Dilation: 6 Effacement (%): 80 Station: -2 Exam by:: Dr. Ike Bene  Labs: Lab Results  Component Value Date   WBC 11.1* 04/08/2014   HGB 11.1* 04/08/2014   HCT 33.6* 04/08/2014   MCV 84.8 04/08/2014   PLT 253 04/08/2014    Assessment / Plan: IOL - s/p cytotec, FB and continue on pitocin, s/p AROM with moderate mec GHTN - not requiring mag. Stable labs. IV Lab prn 160/105 ID: GBS Neg FWB: Cat I, moderate mec Pain: epidural Method of delivery: Expect NSVD  [name] 04/08/2014, 6:02 AM

## 2014-04-09 DIAGNOSIS — R030 Elevated blood-pressure reading, without diagnosis of hypertension: Secondary | ICD-10-CM

## 2014-04-09 DIAGNOSIS — Z98891 History of uterine scar from previous surgery: Secondary | ICD-10-CM

## 2014-04-09 LAB — CBC
HEMATOCRIT: 32.3 % — AB (ref 36.0–46.0)
HEMOGLOBIN: 10.7 g/dL — AB (ref 12.0–15.0)
MCH: 28.2 pg (ref 26.0–34.0)
MCHC: 33.1 g/dL (ref 30.0–36.0)
MCV: 85.2 fL (ref 78.0–100.0)
Platelets: 236 10*3/uL (ref 150–400)
RBC: 3.79 MIL/uL — ABNORMAL LOW (ref 3.87–5.11)
RDW: 14.6 % (ref 11.5–15.5)
WBC: 26.1 10*3/uL — AB (ref 4.0–10.5)

## 2014-04-09 MED ORDER — DIPHENHYDRAMINE HCL 50 MG/ML IJ SOLN: 25.0000 mg | INTRAMUSCULAR | Status: DC | PRN

## 2014-04-09 MED ORDER — IBUPROFEN 600 MG PO TABS: 600.0000 mg | ORAL_TABLET | Freq: Four times a day (QID) | ORAL | Status: DC | PRN

## 2014-04-09 MED ORDER — PRENATAL MULTIVITAMIN CH
1.0000 | ORAL_TABLET | Freq: Every day | ORAL | Status: DC
  Administered 2014-04-09 – 2014-04-11 (×3): 1 via ORAL
  Filled 2014-04-09 (×3): qty 1

## 2014-04-09 MED ORDER — SODIUM CHLORIDE 0.9 % IJ SOLN: 3.0000 mL | INTRAMUSCULAR | Status: DC | PRN

## 2014-04-09 MED ORDER — NALBUPHINE HCL 10 MG/ML IJ SOLN
5.0000 mg | INTRAMUSCULAR | Status: DC | PRN
Start: 2014-04-09 — End: 2014-04-11

## 2014-04-09 MED ORDER — ACETAMINOPHEN 500 MG PO TABS
1000.0000 mg | ORAL_TABLET | Freq: Four times a day (QID) | ORAL | Status: AC
  Administered 2014-04-09: 1000 mg via ORAL
  Filled 2014-04-09 (×2): qty 2

## 2014-04-09 MED ORDER — SIMETHICONE 80 MG PO CHEW
80.0000 mg | CHEWABLE_TABLET | Freq: Three times a day (TID) | ORAL | Status: DC
  Administered 2014-04-09 – 2014-04-11 (×6): 80 mg via ORAL
  Filled 2014-04-09 (×4): qty 1

## 2014-04-09 MED ORDER — WITCH HAZEL-GLYCERIN EX PADS: 1.0000 "application " | MEDICATED_PAD | CUTANEOUS | Status: DC | PRN

## 2014-04-09 MED ORDER — SIMETHICONE 80 MG PO CHEW
80.0000 mg | CHEWABLE_TABLET | ORAL | Status: DC
  Administered 2014-04-10 (×2): 80 mg via ORAL
  Filled 2014-04-09 (×2): qty 1

## 2014-04-09 MED ORDER — DIPHENHYDRAMINE HCL 25 MG PO CAPS: 25.0000 mg | ORAL_CAPSULE | Freq: Four times a day (QID) | ORAL | Status: DC | PRN

## 2014-04-09 MED ORDER — OXYCODONE-ACETAMINOPHEN 5-325 MG PO TABS
1.0000 | ORAL_TABLET | ORAL | Status: DC | PRN
  Administered 2014-04-09: 1 via ORAL
  Administered 2014-04-10 – 2014-04-11 (×3): 2 via ORAL
  Filled 2014-04-09 (×2): qty 2
  Filled 2014-04-09 (×3): qty 1

## 2014-04-09 MED ORDER — NALBUPHINE HCL 10 MG/ML IJ SOLN: 5.0000 mg | INTRAMUSCULAR | Status: DC | PRN

## 2014-04-09 MED ORDER — SIMETHICONE 80 MG PO CHEW
80.0000 mg | CHEWABLE_TABLET | ORAL | Status: DC | PRN
  Filled 2014-04-09: qty 1

## 2014-04-09 MED ORDER — ZOLPIDEM TARTRATE 5 MG PO TABS: 5.0000 mg | ORAL_TABLET | Freq: Every evening | ORAL | Status: DC | PRN

## 2014-04-09 MED ORDER — MEASLES, MUMPS & RUBELLA VAC ~~LOC~~ INJ
0.5000 mL | INJECTION | Freq: Once | SUBCUTANEOUS | Status: DC
  Filled 2014-04-09: qty 0.5

## 2014-04-09 MED ORDER — DIPHENHYDRAMINE HCL 25 MG PO CAPS: 25.0000 mg | ORAL_CAPSULE | ORAL | Status: DC | PRN

## 2014-04-09 MED ORDER — ONDANSETRON HCL 4 MG/2ML IJ SOLN: 4.0000 mg | Freq: Three times a day (TID) | INTRAMUSCULAR | Status: DC | PRN

## 2014-04-09 MED ORDER — MAGNESIUM HYDROXIDE 400 MG/5ML PO SUSP: 30.0000 mL | ORAL | Status: DC | PRN

## 2014-04-09 MED ORDER — METOCLOPRAMIDE HCL 5 MG/ML IJ SOLN: 10.0000 mg | Freq: Three times a day (TID) | INTRAMUSCULAR | Status: DC | PRN

## 2014-04-09 MED ORDER — LACTATED RINGERS IV SOLN
INTRAVENOUS | Status: DC
  Administered 2014-04-09 (×2): via INTRAVENOUS

## 2014-04-09 MED ORDER — SENNOSIDES-DOCUSATE SODIUM 8.6-50 MG PO TABS
2.0000 | ORAL_TABLET | ORAL | Status: DC
  Administered 2014-04-10 (×2): 2 via ORAL
  Filled 2014-04-09 (×2): qty 2

## 2014-04-09 MED ORDER — OXYTOCIN 40 UNITS IN LACTATED RINGERS INFUSION - SIMPLE MED: 62.5000 mL/h | INTRAVENOUS | Status: AC

## 2014-04-09 MED ORDER — NALOXONE HCL 0.4 MG/ML IJ SOLN: 0.4000 mg | INTRAMUSCULAR | Status: DC | PRN

## 2014-04-09 MED ORDER — TETANUS-DIPHTH-ACELL PERTUSSIS 5-2.5-18.5 LF-MCG/0.5 IM SUSP: 0.5000 mL | Freq: Once | INTRAMUSCULAR | Status: DC

## 2014-04-09 MED ORDER — ONDANSETRON HCL 4 MG PO TABS: 4.0000 mg | ORAL_TABLET | ORAL | Status: DC | PRN

## 2014-04-09 MED ORDER — IBUPROFEN 600 MG PO TABS
600.0000 mg | ORAL_TABLET | Freq: Four times a day (QID) | ORAL | Status: DC
  Administered 2014-04-09 – 2014-04-11 (×8): 600 mg via ORAL
  Filled 2014-04-09 (×10): qty 1

## 2014-04-09 MED ORDER — HYDROMORPHONE HCL PF 1 MG/ML IJ SOLN
1.0000 mg | Freq: Once | INTRAMUSCULAR | Status: AC
  Administered 2014-04-09: 1 mg via INTRAVENOUS
  Filled 2014-04-09: qty 1

## 2014-04-09 MED ORDER — DIBUCAINE 1 % RE OINT: 1.0000 "application " | TOPICAL_OINTMENT | RECTAL | Status: DC | PRN

## 2014-04-09 MED ORDER — DIPHENHYDRAMINE HCL 50 MG/ML IJ SOLN: 12.5000 mg | INTRAMUSCULAR | Status: DC | PRN

## 2014-04-09 MED ORDER — NALOXONE HCL 1 MG/ML IJ SOLN
1.0000 ug/kg/h | INTRAVENOUS | Status: DC | PRN
  Filled 2014-04-09: qty 2

## 2014-04-09 MED ORDER — LANOLIN HYDROUS EX OINT: 1.0000 "application " | TOPICAL_OINTMENT | CUTANEOUS | Status: DC | PRN

## 2014-04-09 MED ORDER — MENTHOL 3 MG MT LOZG: 1.0000 | LOZENGE | OROMUCOSAL | Status: DC | PRN

## 2014-04-09 MED ORDER — ONDANSETRON HCL 4 MG/2ML IJ SOLN
4.0000 mg | INTRAMUSCULAR | Status: DC | PRN
Start: 2014-04-09 — End: 2014-04-11
  Filled 2014-04-09: qty 2

## 2014-04-09 NOTE — Op Note (Signed)
I was present during procedure  Adam Phenix, MD

## 2014-04-09 NOTE — Progress Notes (Signed)
Subjective: Postpartum Day 1: Cesarean Delivery Patient reports no pain at this time. States had some earlier and received pain medication for this. Has not yet eaten, though is going to order breakfast.    Objective: Vital signs in last 24 hours: Temp:  [97.5 F (36.4 C)-98.8 F (37.1 C)] 97.9 F (36.6 C) (05/30 0600) Pulse Rate:  [76-123] 80 (05/30 0600) Resp:  [16-33] 20 (05/30 0600) BP: (115-155)/(62-117) 133/82 mmHg (05/30 0600) SpO2:  [84 %-98 %] 95 % (05/30 0600)  Physical Exam:  General: alert, cooperative and no distress Lochia: appropriate Uterine Fundus: firm Incision: bandage c/d/i DVT Evaluation: No evidence of DVT seen on physical exam. Negative Homan's sign.   Recent Labs  04/08/14 2205 04/09/14 0610  HGB 10.0* 10.7*  HCT 30.5* 32.3*    Assessment/Plan: Status post Cesarean section. Doing well postoperatively.  Continue current care.  Ashley Grant 04/09/2014, 7:44 AM

## 2014-04-09 NOTE — Progress Notes (Signed)
I have seen and examined this patient and I agree with the above. Anticipate d/c in AM 5/31. Arabella Merles 8:54 AM 04/09/2014

## 2014-04-09 NOTE — Addendum Note (Signed)
Addendum created 04/09/14 0911 by Shanon Payor, CRNA   Modules edited: Notes Section   Notes Section:  File: [id number]

## 2014-04-09 NOTE — Lactation Note (Signed)
This note was copied from the chart of Ashley Elliotte Siliesar-Lopez. Lactation Consultation Note Assisted baby in football position, mom had c-section. Rt. Breast Lg. Pendulum shaped soft breast, compressible nipple, baby easily latched. Lt. Breast Lg. Pendulum shaped, w/lots of edema especially posterior and nipple NOT compressible. Baby isn't able to latch, although mom states baby has fed on the breast. I explained the baby couldn't gotten a deep latch d/t edema. Elevated breast, hand pump given, has colostrum. Hand expression and hand pump demonstrated. Pt. speaks some English, asked questions appropriately. Mom encouraged to feed baby 8-12 times/24 hours and with feeding cues. Specifics of an asymmetric latch shown. Encouraged to call for assistance if needed and to verify proper latch.Mom encouraged to feed baby w/feeding cues. Mom made aware of O/P services, breastfeeding support groups, community resources, and our phone # for post-discharge questions. [facility] brochure given w/resources, support groups and LC services in Spanish given.  Patient Name: Ashley Grant ALPFX'T Date: 04/09/2014 Reason for consult: Initial assessment   Maternal Data Infant to breast within first hour of birth: Yes Has patient been taught Hand Expression?: Yes Does the patient have breastfeeding experience prior to this delivery?: No  Feeding Feeding Type: Breast Fed Length of feed: 15 min (still feeding)  LATCH Score/Interventions Latch: Grasps breast easily, tongue down, lips flanged, rhythmical sucking. Intervention(s): Adjust position;Assist with latch;Breast massage;Breast compression  Audible Swallowing: Spontaneous and intermittent Intervention(s): Skin to skin;Hand expression;Alternate breast massage  Type of Nipple: Everted at rest and after stimulation (semi flat/Lt. not compressible d/t edema)  Comfort (Breast/Nipple): Soft / non-tender     Hold (Positioning): Assistance needed to  correctly position infant at breast and maintain latch. Intervention(s): Skin to skin;Position options;Support Pillows;Breastfeeding basics reviewed  LATCH Score: 9  Lactation Tools Discussed/Used Tools: Pump Breast pump type: Manual Initiated by:: Peri Jefferson RN Date initiated:: 04/09/14   Consult Status Consult Status: Follow-up Date: 04/09/14 Follow-up type: In-patient    Charyl Dancer 04/09/2014, 3:20 AM

## 2014-04-09 NOTE — Anesthesia Postprocedure Evaluation (Signed)
  Anesthesia Post-op Note  Patient: Ashley Grant  Procedure(s) Performed: Procedure(s): CESAREAN SECTION (N/A)  Patient Location: Mother/Baby  Anesthesia Type:Epidural  Level of Consciousness: awake, alert  and oriented  Airway and Oxygen Therapy: Patient Spontanous Breathing  Post-op Pain: none  Post-op Assessment: Post-op Vital signs reviewed, Patient's Cardiovascular Status Stable, Respiratory Function Stable, No headache, No backache, No residual numbness and No residual motor weakness  Post-op Vital Signs: Reviewed and stable  Last Vitals:  Filed Vitals:   04/09/14 0600  BP: 133/82  Pulse: 80  Temp: 36.6 C  Resp: 20    Complications: No apparent anesthesia complications

## 2014-04-10 NOTE — Lactation Note (Signed)
This note was copied from the chart of Ashley Grant. Lactation Consultation Note  Patient Name: Ashley Grant Date: 04/10/2014 Reason for consult: Follow-up assessment;Breast/nipple pain;Difficult latch Mom called for assist with latching baby, baby sleepy. Had Mom use DEBP to pre-pump to soften aerola and bring nipples more erect. Assisted Mom with positioning of baby, Mom has difficulty obtaining depth with latch. Initiated #20 nipple shield on right breast. Mom latched baby with minimal assist. Baby nursed for approx 8 minutes, lots of colostrum in nipple shield. Mom was reassured. Mom has struggled with latch more on the left breast, tried #20 nipple shield, again baby latched easily. Mom needed minimal assist and baby nursed for 20 minutes, lots of colostrum present. Plan for tonight is to pre-pump for 3-5 minutes using DEBP, attempt to latch baby without assist, use nipple shield if needed. Keep baby nursing for 15-20 minutes each breast. Look for colostrum in the nipple shield. Post pump as often as possible. Mom reports she is registered with WIC, may need [facility] loaner before d/c in the morning. Mom has paperwork. Encouraged Mom to call for assist as needed. Alex, the Spanish interpreter present for visit.   Maternal Data    Feeding Feeding Type: Breast Fed Length of feed: 20 min  LATCH Score/Interventions Latch: Grasps breast easily, tongue down, lips flanged, rhythmical sucking. (using #20 nipple shield, left breast) Intervention(s): Adjust position;Assist with latch  Audible Swallowing: Spontaneous and intermittent  Type of Nipple: Everted at rest and after stimulation (short shaft, aerola edema) Intervention(s): Hand pump;Reverse pressure  Comfort (Breast/Nipple): Filling, red/small blisters or bruises, mild/mod discomfort  Problem noted: Mild/Moderate discomfort;Cracked, bleeding, blisters, bruises Interventions   (Cracked/bleeding/bruising/blister): Expressed breast milk to nipple Interventions (Mild/moderate discomfort): Comfort gels;Reverse pressue;Pre-pump if needed  Hold (Positioning): Assistance needed to correctly position infant at breast and maintain latch.  LATCH Score: 8  Lactation Tools Discussed/Used Tools: Nipple Shields;Pump;Comfort gels Nipple shield size: 20 Breast pump type: Double-Electric Breast Pump   Consult Status Consult Status: Follow-up Date: 04/11/14 Follow-up type: In-patient    Alfred Levins 04/10/2014, 9:47 PM

## 2014-04-10 NOTE — Progress Notes (Signed)
Subjective: Postpartum Day 2: Cesarean Delivery Patient reports incisional pain, tolerating PO, + flatus, + BM and no problems voiding.   Ambulating, having some issues with breast feeding. Discussed with lactation and will keep additional day for breast feeding control.  Objective: Vital signs in last 24 hours: Temp:  [97.5 F (36.4 C)-98.2 F (36.8 C)] 98 F (36.7 C) (05/31 0510) Pulse Rate:  [72-97] 72 (05/31 0510) Resp:  [18-20] 18 (05/31 0510) BP: (105-135)/(64-76) 135/76 mmHg (05/31 0510) SpO2:  [96 %-98 %] 96 % (05/31 0510)  Physical Exam:  General: alert, cooperative, appears stated age and no distress Lochia: appropriate Uterine Fundus: Firm U-2 Incision: no significant drainage, no dehiscence, no significant erythema DVT Evaluation: No evidence of DVT seen on physical exam. Negative Homan's sign. No cords or calf tenderness.   Recent Labs  04/08/14 2205 04/09/14 0610  HGB 10.0* 10.7*  HCT 30.5* 32.3*    Assessment/Plan: Status post Cesarean section. Doing well postoperatively.  Continue current care. Plan to discharge tomorrow, considering Mirena for MOC, Breast feeding. No acute issues today.  [name] 04/10/2014, 8:59 AM

## 2014-04-10 NOTE — Lactation Note (Signed)
This note was copied from the chart of Ashley Talicia Siliesar-Lopez. Lactation Consultation Note  Patient Name: Ashley Grant Date: 04/10/2014  Mom is having difficulty latching baby to both breasts, her left nipple has positional stripe and is cracked. Both nipples are sore. The left nipple is non-compressible, thick, tough. Mom has large amount of generalized edema. Baby has BF for 10 minutes before I arrived. LC assisted Mom in football hold on right breast. With breast compression baby could latch, but Mom needing much assistance. Tried #20 nipple shield but baby became sleepy and would not re-latch. Mom has comfort gels for sore nipples, care for sore nipples reviewed. Advised Mom to call [facility] with next feeding when baby is awake so we can work on feeding plan and helping Mom to be more independent with latching her baby. Alex, the Spanish interpreter present for visit.    Maternal Data    Feeding Feeding Type: Breast Fed Length of feed: 5 min  LATCH Score/Interventions Latch: Repeated attempts needed to sustain latch, nipple held in mouth throughout feeding, stimulation needed to elicit sucking reflex. Intervention(s): Adjust position;Assist with latch;Breast massage;Breast compression  Audible Swallowing: A few with stimulation  Type of Nipple: Everted at rest and after stimulation (short shaft bilateral, left nipple thick, tough, non compres) Intervention(s): Hand pump;Double electric pump  Comfort (Breast/Nipple): Filling, red/small blisters or bruises, mild/mod discomfort  Problem noted:  (positional stripe, cracking on left nipple)  Hold (Positioning): Assistance needed to correctly position infant at breast and maintain latch.  LATCH Score: 6  Lactation Tools Discussed/Used Tools: Pump;Comfort gels;Nipple Shields Nipple shield size: 20 Breast pump type: Double-Electric Breast Pump   Consult Status Consult Status: Follow-up Date: 04/10/14 Follow-up  type: In-patient    Ashley Grant 04/10/2014, 7:57 PM

## 2014-04-11 ENCOUNTER — Encounter (HOSPITAL_COMMUNITY): Payer: Self-pay | Admitting: Obstetrics & Gynecology

## 2014-04-11 ENCOUNTER — Ambulatory Visit: Payer: Self-pay

## 2014-04-11 MED ORDER — OXYCODONE-ACETAMINOPHEN 5-325 MG PO TABS: 1.0000 | ORAL_TABLET | ORAL | Status: DC | PRN

## 2014-04-11 MED ORDER — IBUPROFEN 600 MG PO TABS: 600.0000 mg | ORAL_TABLET | Freq: Four times a day (QID) | ORAL | Status: DC

## 2014-04-11 NOTE — Discharge Summary (Signed)
Obstetric Discharge Summary Reason for Admission: induction of labor Prenatal Procedures: ultrasound Intrapartum Procedures: cesarean: low cervical, transverse Postpartum Procedures: none Complications-Operative and Postpartum: none Hemoglobin  Date Value Ref Range Status  04/09/2014 10.7* 12.0 - 15.0 g/dL Final     HCT  Date Value Ref Range Status  04/09/2014 32.3* 36.0 - 46.0 % Final   Ashley Grant is a 33 y.o. female G1P0 at [redacted]w[redacted]d who presentd to labor and delivery for induction of labor due to [facility]. Given now 14 hours of same dilatation and inability to achieve adequate contractions based on MVUs or pattern, a decision was made to proceed with cesarean delivery for failed induction due to failed dilatation.  Primary low transverse cesarean section with no complications. Viable female infant in cephalic presentation. Apgars 9 and 9. Thick meconium amniotic fluid. Intact placenta, three vessel cord. Normal uterus, fallopian tubes and ovaries bilaterally.  She is breast feeding and planning on Mirena for contraception.   Physical Exam:  General: alert, cooperative and no distress Lochia: appropriate Uterine Fundus: firm Incision: no significant drainage DVT Evaluation: No evidence of DVT seen on physical exam.  Discharge Diagnoses: Term Pregnancy-delivered  Discharge Information: Date: 04/11/2014 Activity: unrestricted Diet: routine Medications: Ibuprofen and Percocet Condition: stable Instructions: refer to practice specific booklet Discharge to: home   Newborn Data: Live born female  Birth Weight: 7 lb 10 oz (3459 g) APGAR: 9, 9  Home with mother.  Ashley Grant 04/11/2014, 7:58 AM  I spoke with and examined patient and agree with resident's note and plan of care.  Tawana Scale, MD OB Fellow 04/11/2014 12:36 PM

## 2014-04-11 NOTE — Progress Notes (Signed)
UR chart review completed.  

## 2014-04-11 NOTE — Discharge Instructions (Signed)
Postpartum Care After Cesarean Delivery °After you deliver your newborn (postpartum period), the usual stay in the hospital is 24 72 hours. If there were problems with your labor or delivery, or if you have other medical problems, you might be in the hospital longer.  °While you are in the hospital, you will receive help and instructions on how to care for yourself and your newborn during the postpartum period.  °While you are in the hospital: °· It is normal for you to have pain or discomfort from the incision in your abdomen. Be sure to tell your nurses when you are having pain, where the pain is located, and what makes the pain worse. °· If you are breastfeeding, you may feel uncomfortable contractions of your uterus for a couple of weeks. This is normal. The contractions help your uterus get back to normal size. °· It is normal to have some bleeding after delivery. °· For the first 1 3 days after delivery, the flow is red and the amount may be similar to a period. °· It is common for the flow to start and stop. °· In the first few days, you may pass some small clots. Let your nurses know if you begin to pass large clots or your flow increases. °· Do not  flush blood clots down the toilet before having the nurse look at them. °· During the next 3 10 days after delivery, your flow should become more watery and pink or brown-tinged in color. °· Ten to fourteen days after delivery, your flow should be a small amount of yellowish-white discharge. °· The amount of your flow will decrease over the first few weeks after delivery. Your flow may stop in 6 8 weeks. Most women have had their flow stop by 12 weeks after delivery. °· You should change your sanitary pads frequently. °· Wash your hands thoroughly with soap and water for at least 20 seconds after changing pads, using the toilet, or before holding or feeding your newborn. °· Your intravenous (IV) tubing will be removed when you are drinking enough fluids. °· The  urine drainage tube (urinary catheter) that was inserted before delivery may be removed within 6 8 hours after delivery or when feeling returns to your legs. You should feel like you need to empty your bladder within the first 6 8 hours after the catheter has been removed. °· In case you become weak, lightheaded, or faint, call your nurse before you get out of bed for the first time and before you take a shower for the first time. °· Within the first few days after delivery, your breasts may begin to feel tender and full. This is called engorgement. Breast tenderness usually goes away within 48 72 hours after engorgement occurs. You may also notice milk leaking from your breasts. If you are not breastfeeding, do not stimulate your breasts. Breast stimulation can make your breasts produce more milk. °· Spending as much time as possible with your newborn is very important. During this time, you and your newborn can feel close and get to know each other. Having your newborn stay in your room (rooming in) will help to strengthen the bond with your newborn. It will give you time to get to know your newborn and become comfortable caring for your newborn. °· Your hormones change after delivery. Sometimes the hormone changes can temporarily cause you to feel sad or tearful. These feelings should not last more than a few days. If these feelings last longer   than that, you should talk to your caregiver. °· If desired, talk to your caregiver about methods of family planning or contraception. °· Talk to your caregiver about immunizations. Your caregiver may want you to have the following immunizations before leaving the hospital: °· Tetanus, diphtheria, and pertussis (Tdap) or tetanus and diphtheria (Td) immunization. It is very important that you and your family (including grandparents) or others caring for your newborn are up-to-date with the Tdap or Td immunizations. The Tdap or Td immunization can help protect your newborn  from getting ill. °· Rubella immunization. °· Varicella (chickenpox) immunization. °· Influenza immunization. You should receive this annual immunization if you did not receive the immunization during your pregnancy. °Document Released: 07/22/2012 Document Reviewed: 07/22/2012 °[employer]® Patient Information ©2014 [employer]. ° °

## 2014-04-11 NOTE — Lactation Note (Signed)
This note was copied from the chart of Ashley Grant. Lactation Consultation Note Mom BF in football position, has Positional stripes to Rt. Nipple. Encouraged to Use NS d/t short shaft. Used interpreter at bedside for some teaching and questions. Hand expression taught and to rub on nipples for soreness. Requesting to supplement as well w/formula. Mom encouraged to BF first then if baby cont. To be fussy then bottle feed. Nipple shield application taught. Formula supplement sheet according to age given w/ interpreer. Patient Name: Ashley Grant [id number] Date: 04/11/2014 Reason for consult: Follow-up assessment;Infant weight loss   Maternal Data    Feeding Feeding Type: Breast Fed  LATCH Score/Interventions Latch: Grasps breast easily, tongue down, lips flanged, rhythmical sucking. Intervention(s): Teach feeding cues;Waking techniques Intervention(s): Adjust position;Assist with latch  Audible Swallowing: A few with stimulation Intervention(s): Hand expression Intervention(s): Hand expression;Skin to skin;Alternate breast massage  Type of Nipple: Everted at rest and after stimulation (semi flat/short shafts) Intervention(s): Double electric pump  Comfort (Breast/Nipple): Filling, red/small blisters or bruises, mild/mod discomfort  Problem noted: Cracked, bleeding, blisters, bruises Interventions  (Cracked/bleeding/bruising/blister): Expressed breast milk to nipple;Double electric pump Interventions (Mild/moderate discomfort): Hand massage;Pre-pump if needed  Hold (Positioning): Assistance needed to correctly position infant at breast and maintain latch. Intervention(s): Breastfeeding basics reviewed;Position options;Support Pillows  LATCH Score: 7  Lactation Tools Discussed/Used Tools: Pump;Nipple Shields;Comfort gels Nipple shield size: 20 Breast pump type: Double-Electric Breast Pump   Consult Status Consult Status: PRN Date: 04/11/14 Follow-up  type: In-patient    Charyl Dancer 04/11/2014, 6:36 AM

## 2014-04-11 NOTE — Lactation Note (Signed)
This note was copied from the chart of Ashley Randy Siliesar-Lopez. Lactation Consultation Note      Follow up consult with this mom of a term baby, now 27 1/2 days old, weight loss 8.8%. Mom has sucking stripes on both nipples. Baby has had more than normal wet and dirty diapers, so is doing well with that. Baby seems hungry, as per mom, after latching. When i asked mom if she was using the nipple shield, she said no. I explained to her that a good latch is beyond her nipple, and if the baby was only latching to her nipple, she would not be able to transfer milk well. She seemed to understand this. She also thought that just going home and getting comfortable would allow for better breast feeding. i explained that that would definitely help, but using the nipple shield at this time  Would help the baby to latch and transfer. . I loaned mom a [employer] DEP and instructed her in it's use, and had mom pump before leaving, in standard setting for 15 minutes. She expressed 5 mls of transitional milk. i explained  to mom that her milk should be increasing now, and the importance of pump 8 times a day, every 3 hours. i also encouraged mom to feed her baby every 3 hours, and to triple feed - breast, then supplement with EBM/formula, (amounts chart in spanish given to mom), and to then pump. Since this is a lot of work for every feeding, i told mom it was important to protect her milk supply with pumping, and making sure her baby is being fed, either by breast or bottle. I explained that she did not need to breast feed each time, if the baby was not latching well, or her nipples were sore.  Mom to call for o/p lactation consult for sometime next week - information sheet on this also given to mom. Puping teaching, part care, WIC pump parts to bring with her - also reviewed with mom  Patient Name: Ashley Grant Date: 04/11/2014     Maternal Data    Feeding    LATCH Score/Interventions                      Lactation Tools Discussed/Used     Consult Status      Ashley Grant 04/11/2014, 3:47 PM

## 2014-09-12 ENCOUNTER — Encounter (HOSPITAL_COMMUNITY): Payer: Self-pay | Admitting: Obstetrics & Gynecology

## 2014-09-27 IMAGING — US US OB COMP LESS 14 WK
1 series · 14 of 28 positions shown · non-contrast
Comparison: None.

CLINICAL DATA: Pelvic pain, no fetal heart tones on exam

EXAM:
OBSTETRIC <14 WK ULTRASOUND
TECHNIQUE: Transabdominal ultrasound was performed for evaluation of the
gestation as well as the maternal uterus and adnexal regions.

[Series 1: us ob comp less 14 wks · 14 of 34 slices shown]
[im 2/34]
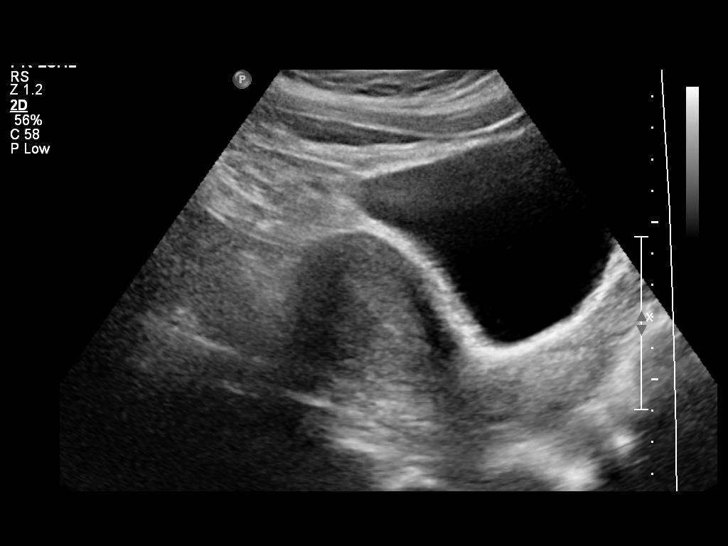
[im 4/34]
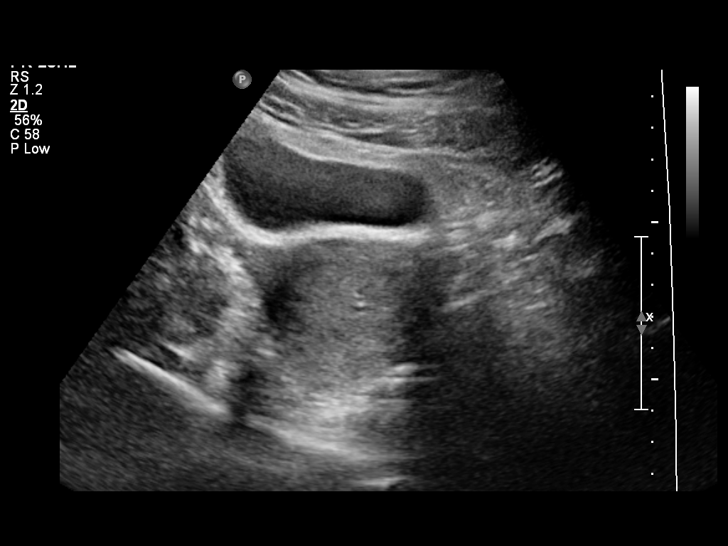
[im 7/34]
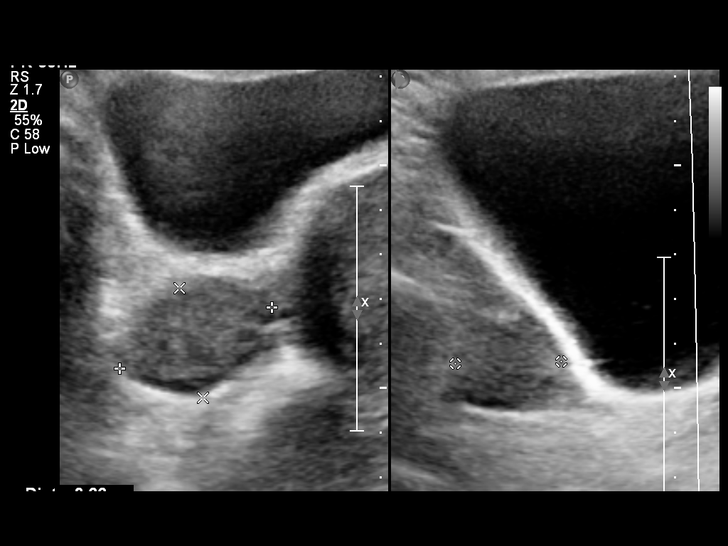
[im 9/34]
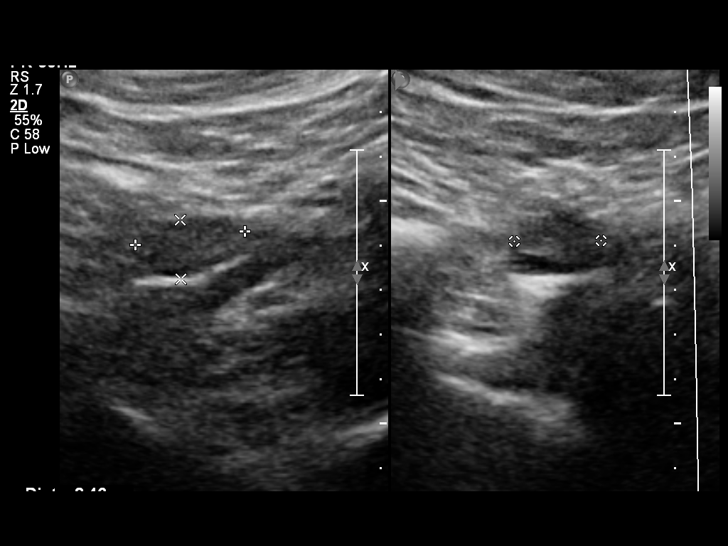
[im 12/34]
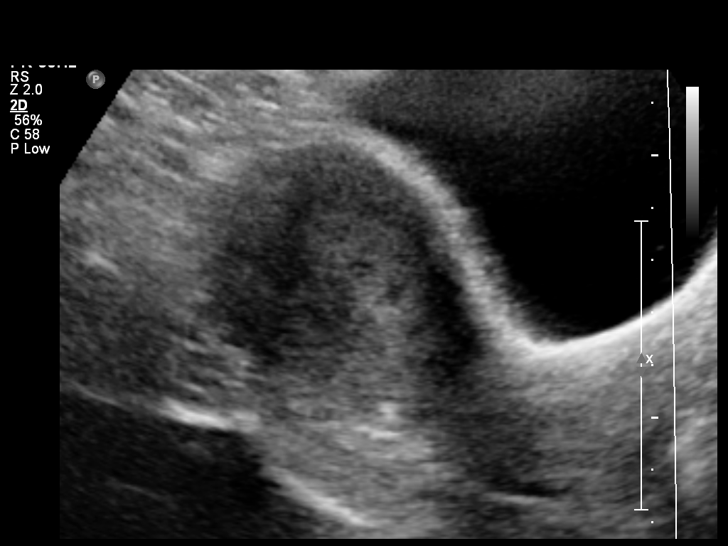
[im 14/34]
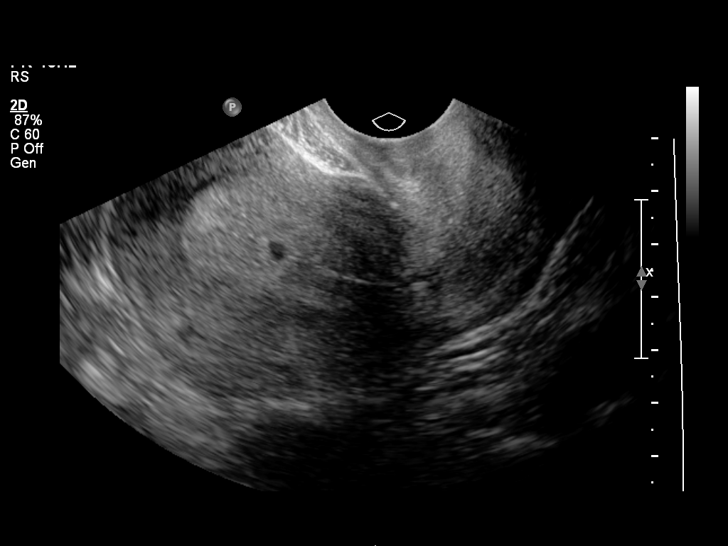
[im 16/34]
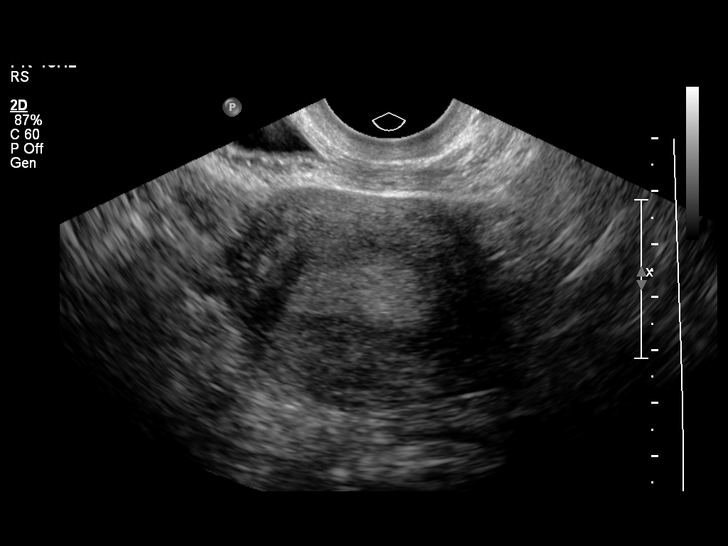
[im 19/34]
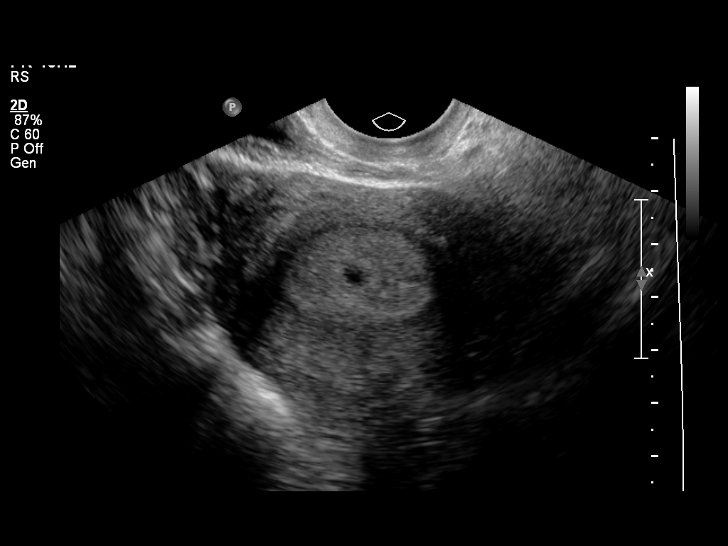
[im 21/34]
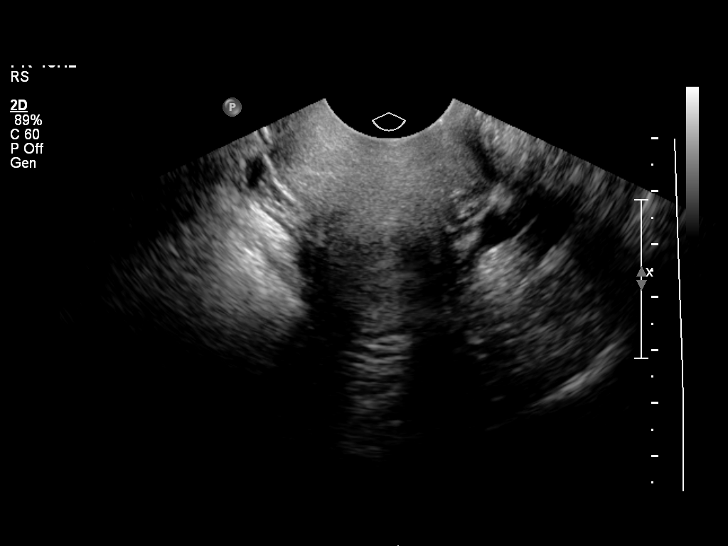
[im 24/34]
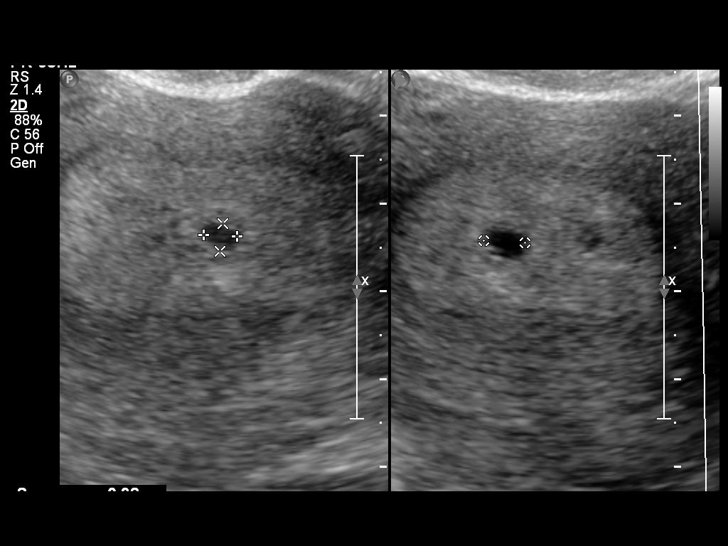
[im 26/34]
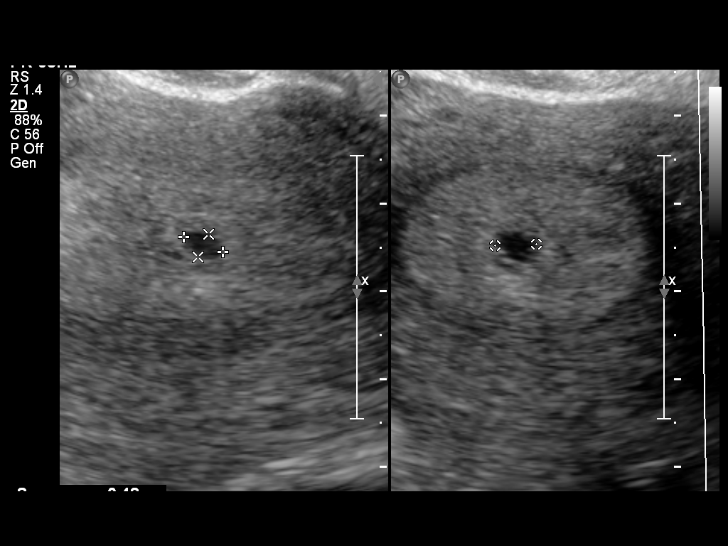
[im 29/34]
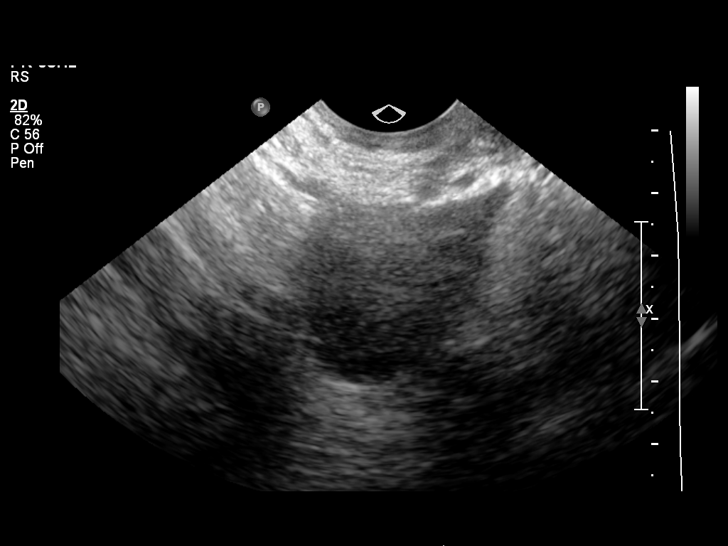
[im 31/34]
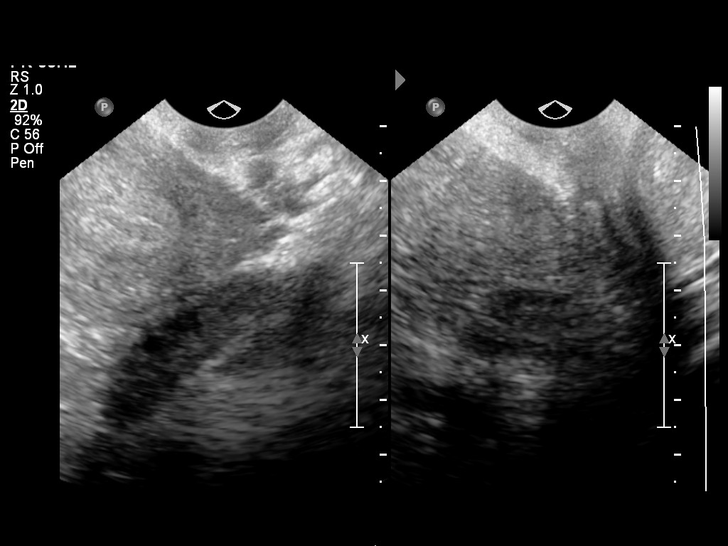
[im 34/34]
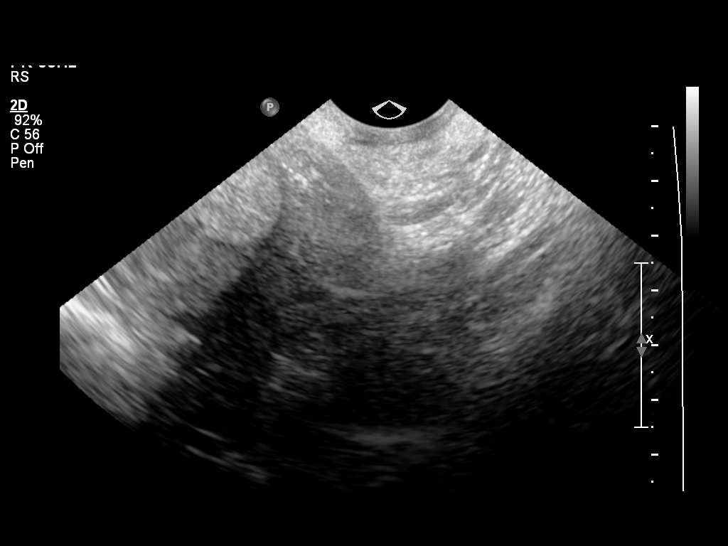

[14 of 28 positions shown; findings below may reference images not displayed]

FINDINGS: Intrauterine gestational sac: Probable early intrauterine
gestational sac

Yolk sac:  Not visualized

Embryo:  Not visualized

Cardiac Activity: Not visualized

MSD: 4  mm   4 w   6  d       US EDC: 04/09/2014

Maternal uterus/adnexae: The ovaries are normal. No free fluid.
IMPRESSION: Probable early intrauterine gestational sac. No yolk sac, fetal
pole, or cardiac activity yet identified. Followup is recommended in
14 days to determine appropriate pregnancy progression and for
purposes of dating.

## 2014-10-01 IMAGING — US US OB TRANSVAGINAL
1 series · 14 of 14 positions shown · non-contrast
Comparison: 08/06/2013

CLINICAL DATA: Pregnant, bleeding

EXAM:
TRANSVAGINAL OB ULTRASOUND
TECHNIQUE: Transvaginal ultrasound was performed for complete evaluation of the
gestation as well as the maternal uterus, adnexal regions, and
pelvic cul-de-sac.

[Series 1: us ob transvaginal · 14 acquisitions, 14 frames shown]
[im 1/14]
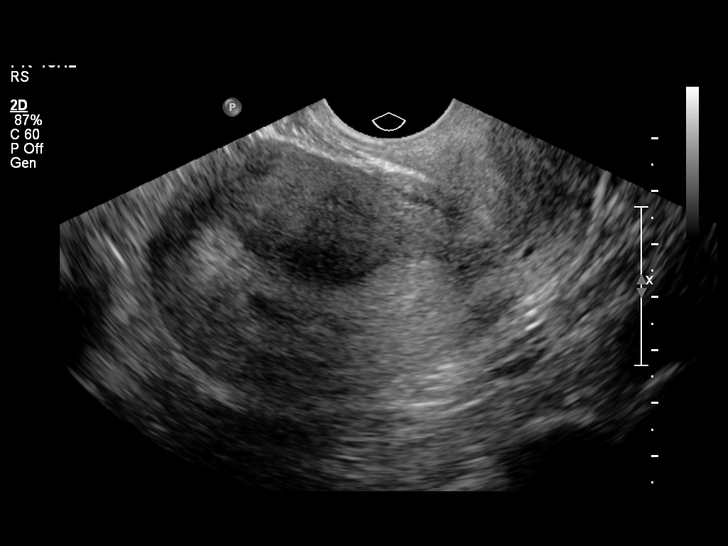
[im 2/14]
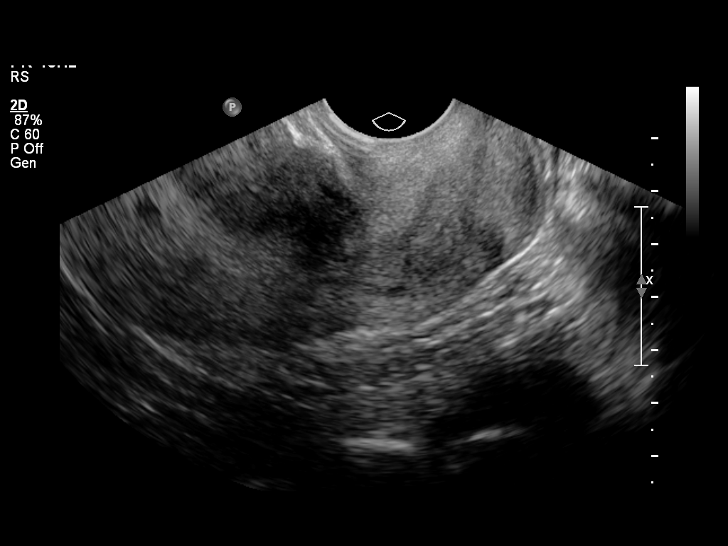
[im 3/14]
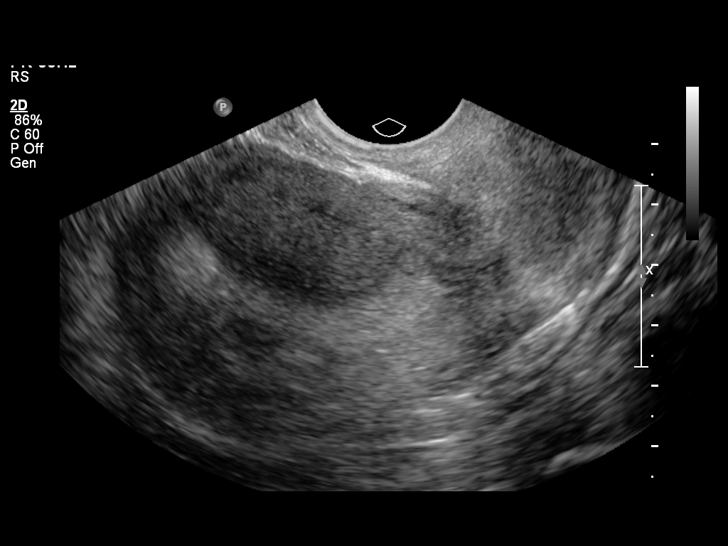
[im 4/14]
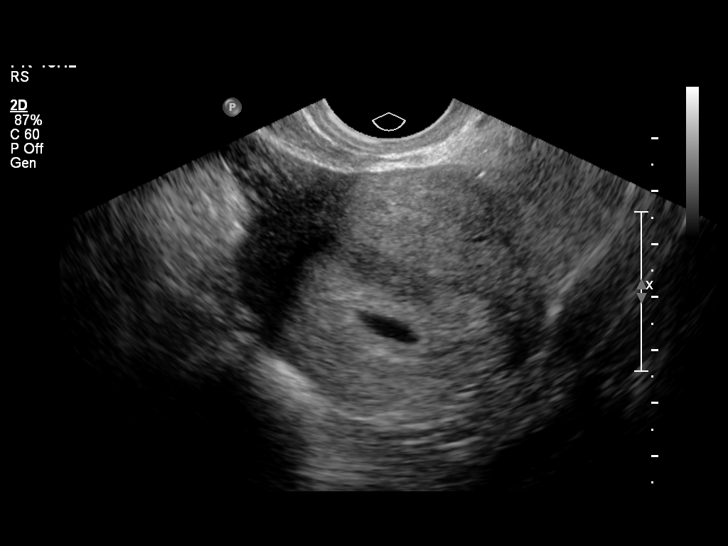
[im 5/14]
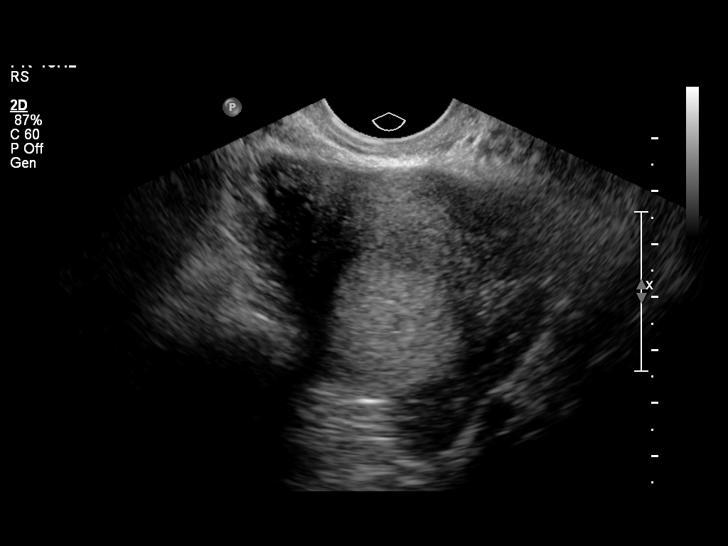
[im 6/14]
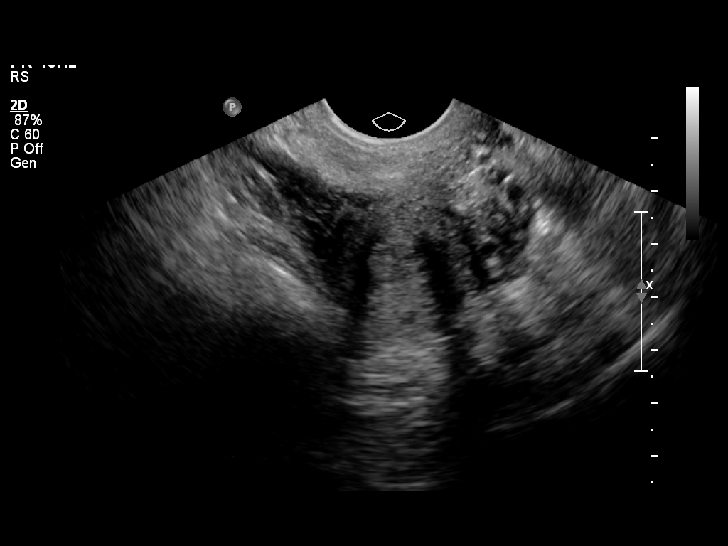
[im 7/14]
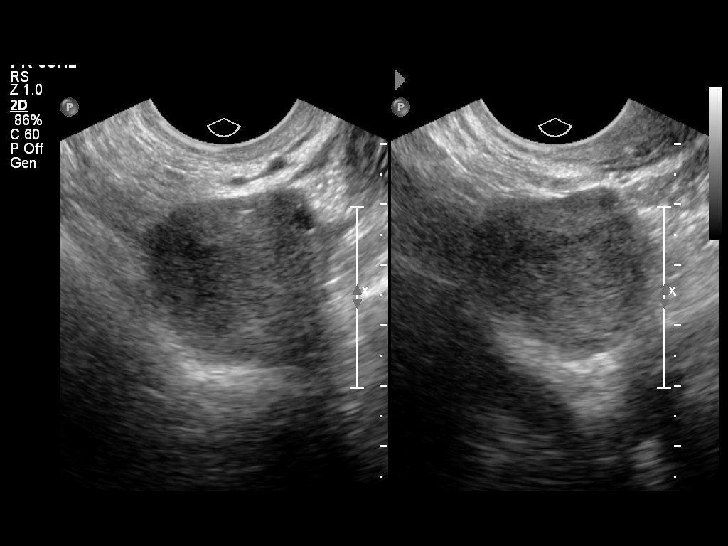
[im 8/14]
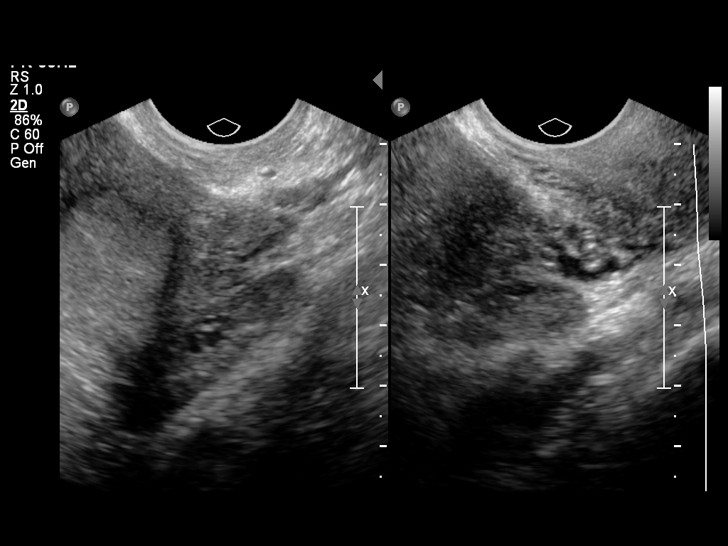
[im 9/14]
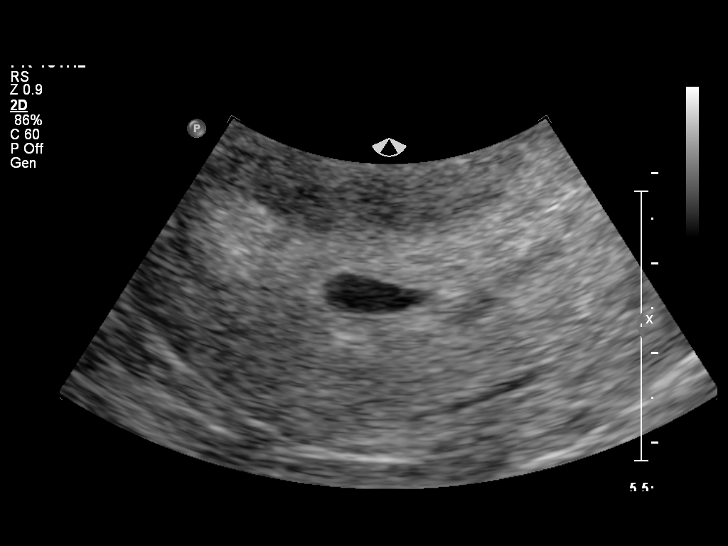
[im 10/14]
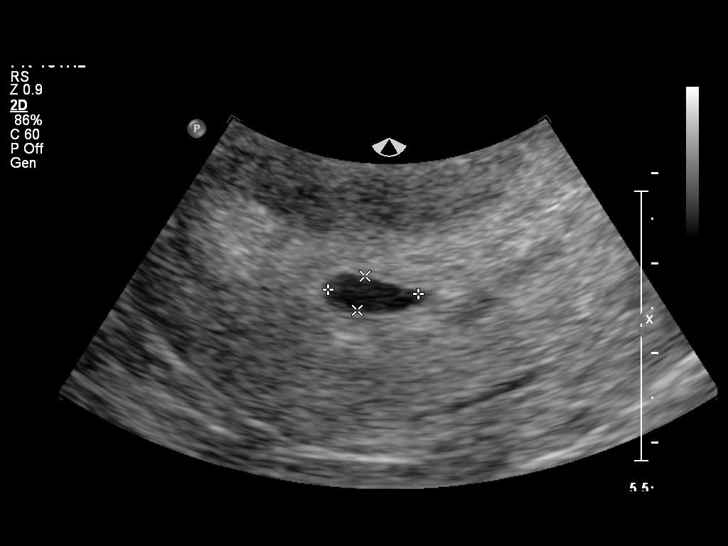
[im 11/14]
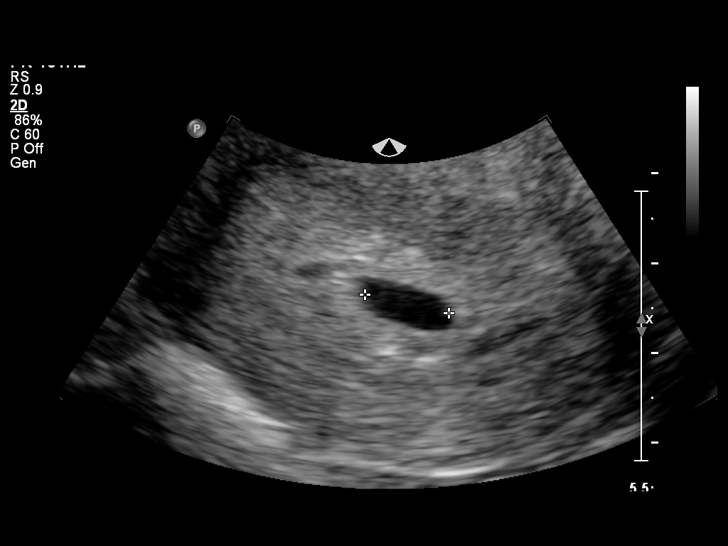
[im 12/14]
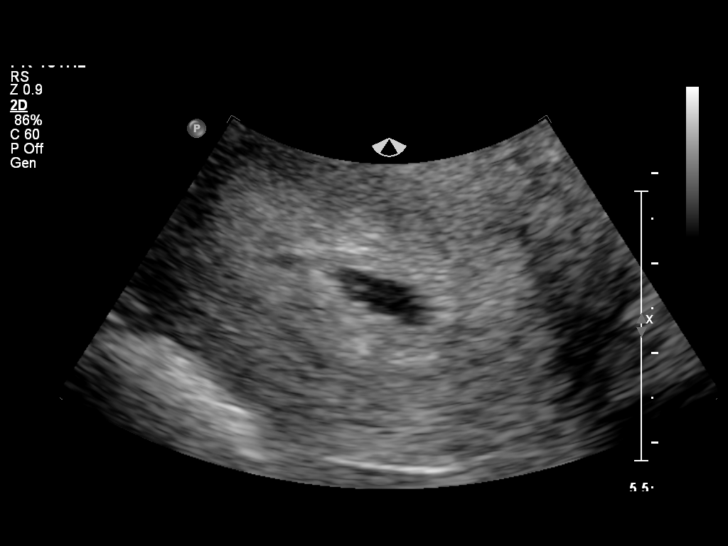
[im 13/14]
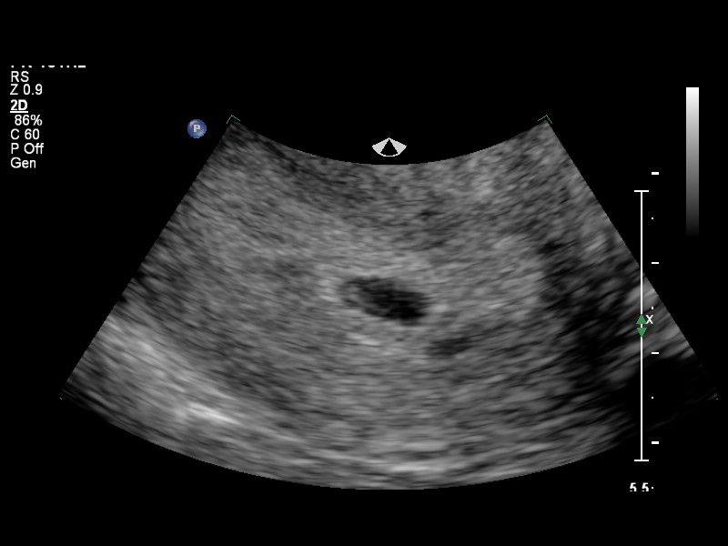
[im 14/14]
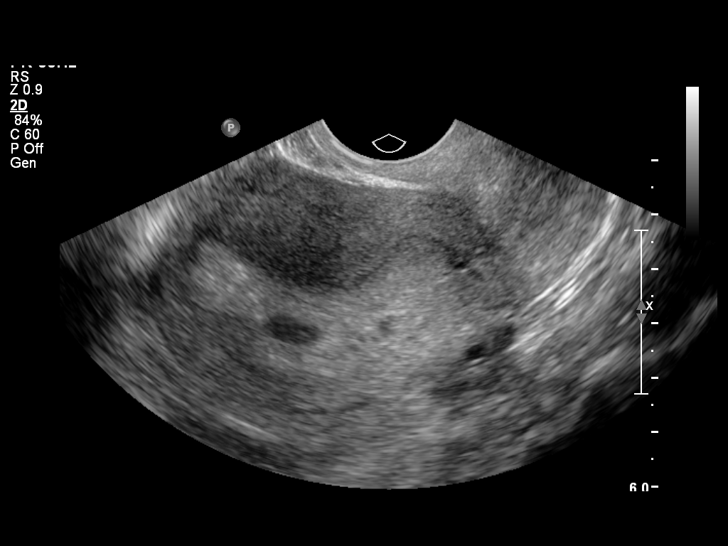

[14 of 14 positions shown; findings below may reference images not displayed]

FINDINGS: Intrauterine gestational sac: Visualized/normal in shape.

Yolk sac:  Present

Embryo:  Not visualized

MSD: 7.9  mm   5 w   3  d

US EDC: 04/09/2014

Maternal uterus/adnexae: No evidence of subchorionic hemorrhage.

Bilateral ovaries are within normal limits.

No free fluid.
IMPRESSION: Single intrauterine gestational sac with yolk sac, measuring 5 weeks
3 days by mean sac diameter. Fetal pole is not visualized.

Follow-up pelvic ultrasound is suggested in 10-14 days to confirm
viability.

## 2016-02-13 ENCOUNTER — Emergency Department (HOSPITAL_COMMUNITY)
Admission: EM | Admit: 2016-02-13 | Discharge: 2016-02-13 | Disposition: A | Discharge status: Home / Self Care | Payer: Self-pay | Attending: Emergency Medicine | Admitting: Emergency Medicine

## 2016-02-13 ENCOUNTER — Encounter (HOSPITAL_COMMUNITY): Payer: Self-pay

## 2016-02-13 DIAGNOSIS — Z79899 Other long term (current) drug therapy: Secondary | ICD-10-CM | POA: Insufficient documentation

## 2016-02-13 DIAGNOSIS — H9203 Otalgia, bilateral: Secondary | ICD-10-CM | POA: Insufficient documentation

## 2016-02-13 MED ORDER — IBUPROFEN 800 MG PO TABS: 800.0000 mg | ORAL_TABLET | Freq: Three times a day (TID) | ORAL | Status: DC

## 2016-02-13 MED ORDER — DEXAMETHASONE SODIUM PHOSPHATE 10 MG/ML IJ SOLN
10.0000 mg | Freq: Once | INTRAMUSCULAR | Status: AC
  Administered 2016-02-13: 10 mg via INTRAMUSCULAR
  Filled 2016-02-13: qty 1

## 2016-02-13 MED ORDER — CETIRIZINE HCL 5 MG PO TABS: 5.0000 mg | ORAL_TABLET | Freq: Every day | ORAL | Status: DC

## 2016-02-13 MED ORDER — FLUTICASONE PROPIONATE 50 MCG/ACT NA SUSP: 2.0000 | Freq: Every day | NASAL | Status: DC

## 2016-02-13 MED ORDER — IBUPROFEN 400 MG PO TABS
800.0000 mg | ORAL_TABLET | Freq: Once | ORAL | Status: AC
  Administered 2016-02-13: 800 mg via ORAL
  Filled 2016-02-13: qty 2

## 2016-02-13 NOTE — ED Provider Notes (Signed)
CSN: [id number]     Arrival date & time 02/13/16  1506 History   First MD Initiated Contact with Patient 02/13/16 1534     Chief Complaint  Patient presents with  . Otalgia   HPI   Ashley Grant is an 35 y.o. female with no significant PMH who presents to the ED for evaluation of bilateral ear pain, fullness, and itching. She states the symptoms have been present for "months" but it is getting to a point where she "can't take it anymore." She states that mostly her ears feel clogged and itchy like there is something in there, but also endorses intermittent bilateral ear pain. States that she feels like her hearing is muffled. She has tried cleaning her ears with q-tips with no relief. Denies drainage/discharge or bleeding. Denies dizziness. Denies fever, chills, URI symptoms.   History reviewed. No pertinent past medical history. Past Surgical History  Procedure Laterality Date  . Cesarean section N/A 04/08/2014    Procedure: CESAREAN SECTION;  Surgeon: Adam PhenixJames G Arnold, MD;  Location: [facility];  Service: Obstetrics;  Laterality: N/A;   Family History  Problem Relation Age of Onset  . Diabetes Father   . Diabetes Maternal Grandfather   . Cancer Paternal Grandfather    Social History  Substance Use Topics  . Smoking status: Never Smoker   . Smokeless tobacco: None  . Alcohol Use: No   OB History    Gravida Para Term Preterm AB TAB SAB Ectopic Multiple Living   1 1 1       1      Review of Systems  All other systems reviewed and are negative.     Allergies  Review of patient's allergies indicates no known allergies.  Home Medications   Prior to Admission medications   Medication Sig Start Date End Date Taking? Authorizing Provider  calcium carbonate (TUMS - DOSED IN MG ELEMENTAL CALCIUM) 500 MG chewable tablet Chew 1-2 tablets by mouth daily as needed for indigestion or heartburn.    Historical Provider, MD  ibuprofen (ADVIL,MOTRIN) 600 MG tablet Take 1 tablet (600 mg  total) by mouth every 6 (six) hours. 04/11/14   Myra RudeJeremy E Schmitz, MD  Menthol-Methyl Salicylate (ICY HOT EXTRA STRENGTH) 10-30 % CREA Apply 1 application topically daily as needed (For swelling and pain in legs.).    Historical Provider, MD  OVER THE COUNTER MEDICATION Apply 1 application topically daily. Patient is using [place] lotion on legs once a day for swelling and pain in legs.    Historical Provider, MD  oxyCODONE-acetaminophen (PERCOCET/ROXICET) 5-325 MG per tablet Take 1-2 tablets by mouth every 4 (four) hours as needed for severe pain (moderate - severe pain). 04/11/14   Myra RudeJeremy E Schmitz, MD  Prenatal Vit-Fe Fumarate-FA (PRENATAL MULTIVITAMIN) TABS tablet Take 1 tablet by mouth daily at 12 noon.    Historical Provider, MD   BP 127/83 mmHg  Pulse 110  Temp(Src) 98.1 F (36.7 C) (Oral)  Resp 20  SpO2 96% Physical Exam  Constitutional: She is oriented to person, place, and time. No distress.  HENT:  Head: Atraumatic.  Right Ear: External ear normal.  Left Ear: External ear normal.  Nose: Mucosal edema present.  No external ear tenderness or lesions bilaterally. No mastoid tenderness or erythema. No tragal tenderness.  Bilateral canals are clear with minimal cerumen. No edema or discharge. Bilateral TM are visualized. There does appear to be serous fluid behind both TMs though no erythema, bulging, retraction. Hearing intact to finger rub  bilaterally.  Eyes: Conjunctivae are normal. No scleral icterus.  Neck: Normal range of motion. Neck supple.  No cervical LAD  Cardiovascular: Normal rate and regular rhythm.   Pulmonary/Chest: Effort normal. No respiratory distress. She exhibits no tenderness.  Abdominal: Soft. She exhibits no distension. There is no tenderness.  Neurological: She is alert and oriented to person, place, and time.  Skin: Skin is warm and dry. She is not diaphoretic.  Psychiatric: She has a normal mood and affect. Her behavior is normal.  Nursing note and vitals  reviewed.   ED Course  Procedures (including critical care time) Labs Review Labs Reviewed - No data to display  Imaging Review No results found. I have personally reviewed and evaluated these images and lab results as part of my medical decision-making.   EKG Interpretation None      MDM   Final diagnoses:  Otalgia, bilateral    I discussed with pt that her ears do appear to have some inner fluid but no e/o infection, no cerumen impaction to be cleaned today. No indication for abx at this time. Suspect eustachian tube dysfunction vs allergic etiology to her symptoms. No fever. No mastoid erythema/edema/tenderness to suggest mastoiditis. Ibuprofen and decadron given in the ED. Rx given for zyrtec, ibuprofen, and flonase. Instructed to f/u with ENT this week. ER return precautions given.     Carlene Coria, PA-C 02/14/16 1113  Raeford Razor, MD 02/15/16 [id number]

## 2016-02-13 NOTE — ED Notes (Addendum)
Pt reports bilateral otalgia and reports ears are itchy and they feel clogged "and I cant hear very well."

## 2016-02-13 NOTE — Discharge Instructions (Signed)
Please call Dr. Avel Sensoreoh's office to schedule a follow up appointment. In the meantime take the medications as prescribed. Return to the emergency room for new or worsening symptoms.

## 2016-03-18 ENCOUNTER — Ambulatory Visit: Payer: Medicaid Other | Attending: Internal Medicine

## 2016-04-01 ENCOUNTER — Ambulatory Visit (INDEPENDENT_AMBULATORY_CARE_PROVIDER_SITE_OTHER): Payer: No Typology Code available for payment source | Admitting: Family Medicine

## 2016-04-01 ENCOUNTER — Encounter: Payer: Self-pay | Admitting: Family Medicine

## 2016-04-01 ENCOUNTER — Other Ambulatory Visit: Payer: Self-pay | Admitting: Family Medicine

## 2016-04-01 VITALS — BP 131/84 | HR 99 | Temp 98.7°F | Resp 16 | Ht 65.0 in | Wt 229.0 lb

## 2016-04-01 DIAGNOSIS — N3000 Acute cystitis without hematuria: Secondary | ICD-10-CM

## 2016-04-01 DIAGNOSIS — Z9109 Other allergy status, other than to drugs and biological substances: Secondary | ICD-10-CM

## 2016-04-01 DIAGNOSIS — M67439 Ganglion, unspecified wrist: Secondary | ICD-10-CM | POA: Insufficient documentation

## 2016-04-01 DIAGNOSIS — R8290 Unspecified abnormal findings in urine: Secondary | ICD-10-CM

## 2016-04-01 DIAGNOSIS — Z91048 Other nonmedicinal substance allergy status: Secondary | ICD-10-CM

## 2016-04-01 DIAGNOSIS — E119 Type 2 diabetes mellitus without complications: Secondary | ICD-10-CM | POA: Insufficient documentation

## 2016-04-01 DIAGNOSIS — E669 Obesity, unspecified: Secondary | ICD-10-CM | POA: Insufficient documentation

## 2016-04-01 DIAGNOSIS — K029 Dental caries, unspecified: Secondary | ICD-10-CM

## 2016-04-01 DIAGNOSIS — M67431 Ganglion, right wrist: Secondary | ICD-10-CM

## 2016-04-01 LAB — COMPLETE METABOLIC PANEL WITH GFR
ALT: 13 U/L (ref 6–29)
AST: 14 U/L (ref 10–30)
Albumin: 4.1 g/dL (ref 3.6–5.1)
Alkaline Phosphatase: 102 U/L (ref 33–115)
BUN: 11 mg/dL (ref 7–25)
CHLORIDE: 105 mmol/L (ref 98–110)
CO2: 27 mmol/L (ref 20–31)
Calcium: 9.1 mg/dL (ref 8.6–10.2)
Creat: 0.73 mg/dL (ref 0.50–1.10)
GFR, Est African American: >89 mL/min (ref >=60)
GLUCOSE: 72 mg/dL (ref 65–99)
POTASSIUM: 3.9 mmol/L (ref 3.5–5.3)
SODIUM: 140 mmol/L (ref 135–146)
Total Bilirubin: 0.4 mg/dL (ref 0.2–1.2)
Total Protein: 6.8 g/dL (ref 6.1–8.1)

## 2016-04-01 LAB — CBC WITH DIFFERENTIAL/PLATELET
Basophils Absolute: 0 cells/uL (ref 0–200)
Basophils Relative: 0 %
EOS ABS: 70 {cells}/uL (ref 15–500)
Eosinophils Relative: 1 %
HCT: 40.9 % (ref 35.0–45.0)
HEMOGLOBIN: 13.3 g/dL (ref 11.7–15.5)
Lymphocytes Relative: 39 %
Lymphs Abs: 2730 cells/uL (ref 850–3900)
MCH: 28.1 pg (ref 27.0–33.0)
MCHC: 32.5 g/dL (ref 32.0–36.0)
MCV: 86.3 fL (ref 80.0–100.0)
MONO ABS: 350 {cells}/uL (ref 200–950)
MPV: 9.2 fL (ref 7.5–12.5)
Monocytes Relative: 5 %
NEUTROS ABS: 3850 {cells}/uL (ref 1500–7800)
Neutrophils Relative %: 55 %
Platelets: 392 10*3/uL (ref 140–400)
RBC: 4.74 MIL/uL (ref 3.80–5.10)
RDW: 13.2 % (ref 11.0–15.0)
WBC: 7 10*3/uL (ref 3.8–10.8)

## 2016-04-01 LAB — POCT URINALYSIS DIP (DEVICE)
Bilirubin Urine: NEGATIVE
Glucose, UA: NEGATIVE mg/dL
Ketones, ur: NEGATIVE mg/dL
Nitrite: POSITIVE — AB
PH: 6 (ref 5.0–8.0)
PROTEIN: NEGATIVE mg/dL
UROBILINOGEN UA: 0.2 mg/dL (ref 0.0–1.0)

## 2016-04-01 LAB — TSH: TSH: 0.37 mIU/L — ABNORMAL LOW

## 2016-04-01 MED ORDER — LEVOCETIRIZINE DIHYDROCHLORIDE 5 MG PO TABS: 5.0000 mg | ORAL_TABLET | Freq: Every evening | ORAL | Status: DC

## 2016-04-01 MED ORDER — CIPROFLOXACIN HCL 250 MG PO TABS: 250.0000 mg | ORAL_TABLET | Freq: Two times a day (BID) | ORAL | Status: DC

## 2016-04-01 MED FILL — CIPROFLOXACIN HCL 250 MG TA: 250 | 3 days supply | Qty: 6 | Fill #0

## 2016-04-01 MED FILL — LEVOCETIRIZINE 5 MG TABLET: 5 | 30 days supply | Qty: 30 | Fill #0

## 2016-04-01 NOTE — Patient Instructions (Addendum)
Will start a trial of Xyzal 5 mg at night for environmental allergies or ear itching Will send a referral to dentist Will send a referral to dermatology for ganglion cyst Exercising to Lose Weight Exercising can help you to lose weight. In order to lose weight through exercise, you need to do vigorous-intensity exercise. You can tell that you are exercising with vigorous intensity if you are breathing very hard and fast and cannot hold a conversation while exercising. Moderate-intensity exercise helps to maintain your current weight. You can tell that you are exercising at a moderate level if you have a higher heart rate and faster breathing, but you are still able to hold a conversation. HOW OFTEN SHOULD I EXERCISE? Choose an activity that you enjoy and set realistic goals. Your health care provider can help you to make an activity plan that works for you. Exercise regularly as directed by your health care provider. This may include:  Doing resistance training twice each week, such as:  Push-ups.  Sit-ups.  Lifting weights.  Using resistance bands.  Doing a given intensity of exercise for a given amount of time. Choose from these options:  150 minutes of moderate-intensity exercise every week.  75 minutes of vigorous-intensity exercise every week.  A mix of moderate-intensity and vigorous-intensity exercise every week. Children, pregnant women, people who are out of shape, people who are overweight, and older adults may need to consult a health care provider for individual recommendations. If you have any sort of medical condition, be sure to consult your health care provider before starting a new exercise program. WHAT ARE SOME ACTIVITIES THAT CAN HELP ME TO LOSE WEIGHT?   Walking at a rate of at least 4.5 miles an hour.  Jogging or running at a rate of 5 miles per hour.  Biking at a rate of at least 10 miles per hour.  Lap swimming.  Roller-skating or in-line  skating.  Cross-country skiing.  Vigorous competitive sports, such as football, basketball, and soccer.  Jumping rope.  Aerobic dancing. HOW CAN I BE MORE ACTIVE IN MY DAY-TO-DAY ACTIVITIES?  Use the stairs instead of the elevator.  Take a walk during your lunch break.  If you drive, park your car farther away from work or school.  If you take public transportation, get off one stop early and walk the rest of the way.  Make all of your phone calls while standing up and walking around.  Get up, stretch, and walk around every 30 minutes throughout the day. WHAT GUIDELINES SHOULD I FOLLOW WHILE EXERCISING?  Do not exercise so much that you hurt yourself, feel dizzy, or get very short of breath.  Consult your health care provider prior to starting a new exercise program.  Wear comfortable clothes and shoes with good support.  Drink plenty of water while you exercise to prevent dehydration or heat stroke. Body water is lost during exercise and must be replaced.  Work out until you breathe faster and your heart beats faster.   This information is not intended to replace advice given to you by your health care provider. Make sure you discuss any questions you have with your health care provider.   Document Released: 11/30/2010 Document Revised: 11/18/2014 Document Reviewed: 03/31/2014 Elsevier Interactive Patient Education 2016 [employer]. Hacer ejercicio para Psychiatric nurse (Exercising to [employer]) Hacer ejercicio puede ayudarlo a Publishing copy de peso. Para bajar de [employer] ejercicio, este debe ser de intensidad vigorosa. Puede saber que est haciendo  ejercicio de intensidad vigorosa si respira con mucha dificultad y rapidez, y no puede mantener una conversacin. El ejercicio de intensidad moderada ayuda a Radio producer peso actual. Puede saber que est haciendo ejercicio de intensidad moderada si tiene una frecuencia cardaca ms elevada y [place] respiracin ms rpida, pero an  puede Diplomatic Services operational officer. CON QU FRECUENCIA DEBO HACER EJERCICIO? Elija una actividad que disfrute y establezca objetivos realistas. El mdico puede ayudarlo a Event organiser un plan de actividades que funcione para usted. Haga ejercicio regularmente como se lo haya indicado el mdico. Esta puede incluir:  Programme researcher, broadcasting/film/video de resistencia dos veces por semana, como:  Flexiones de [place].  Abdominales.  Levantamiento de pesas.  Ejercicios con bandas elsticas.  Realizar una intensidad determinada de ejercicio durante una cantidad determinada de [place]. Elija entre estas opciones:  de ejercicio de intensidad moderada cada semana.  de ejercicio de intensidad vigorosa cada semana.  Burlene Arnt de ejercicio de intensidad moderada y vigorosa cada semana. Los nios, las mujeres [place], las personas que no estn en forma, las personas con sobrepeso y los adultos mayores tal vez tengan que consultar a un mdico para que les d Medical laboratory scientific officer. Si tiene [employer], asegrese de Science writer al mdico antes de comenzar un programa de ejercicios nuevo. CULES SON ALGUNAS ACTIVIDADES QUE PUEDEN AYUDARME A BAJAR DE PESO?   Caminar a un ritmo de al menos 4,38millas (7kilmetros) por hora.  Trotar o correr a un ritmo de 5 millas (8 kilmetros) por hora.  Andar en bicicleta a un ritmo de al menos 10 millas (16 kilmetros) por hora.  Practicar natacin.  Practicar patinaje sobre ruedas normales o en lnea.  Hacer esqu de fondo.  Hacer deportes competitivos vigorosos, como ftbol americano, bsquet y ftbol.  Saltar la soga.  Tomar clases de baile aerbico. CMO PUEDO SER MS ACTIVO EN MIS ACTIVIDADES DIARIAS?  Utilice las Microbiologist del ascensor.  D una caminata durante su hora de almuerzo.  Si conduce, estacione el automvil ms lejos del trabajo o de la escuela.  Si [place] transporte pblico, bjese una parada antes y  camine el resto del camino.  Pngase de pie y camine cada vez que haga llamadas telefnicas.  Levntese, estrese y camine cada a lo largo del Futures trader. QU PAUTAS DEBO SEGUIR MIENTRAS HAGO EJERCICIO?  No haga ejercicio en exceso que pudiera hacer que se lastime, se sienta mareado o tenga dificultad para respirar.  Consulte al mdico antes de comenzar un programa de ejercicios nuevo.  Use ropa cmoda y calzado con buen soporte.  Beba gran cantidad de agua mientras hace ejercicios para evitar la deshidratacin o los golpes de Airline pilot. Durante la actividad fsica se pierde agua corporal que se debe reponer.  Haga ejercicio hasta que se acelere su respiracin y sus latidos cardacos.   Esta informacin no tiene Theme park manager el consejo del mdico. Asegrese de hacerle al mdico cualquier pregunta que tenga.   Document Released: 02/01/2011 Document Revised: 11/18/2014 Elsevier Interactive Patient Education 2016 [employer]. La Presa ganglionar (Ganglion Cyst) Un quiste ganglionar es un bulto no canceroso lleno de lquido que se forma cerca de las articulaciones o los tendones. El quiste ganglionar crece fuera de una articulacin o de la membrana de un tendn. La [employer] de las veces aparece en la mano o la [place], pero tambin puede formarse en el hombro, el codo, la cadera, la rodilla, el tobillo o el pie. El quiste ganglionar redondo u ovalado puede [place]  tamao de un guisante o ser ms grande que una uva. El incremento de la actividad puede aumentar su tamao porque empieza a acumularse ms lquido.  CAUSAS Se desconoce qu causa el crecimiento de un quiste ganglionar. Sin embargo, puede guardar relacin con:  Inflamacin o irritacin alrededor de la articulacin.  Una lesin.  Los movimientos repetitivos o el uso excesivo.  Artritis. FACTORES DE RIESGO Entre los factores de riesgo se incluyen los siguientes:  Ser mujer.  Tener entre 20 y 3850aos. SIGNOS Y  SNTOMAS Entre los sntomas se pueden incluir los siguientes:   Un bulto. Este aparece con ms frecuencia en la mano o la [place], pero tambin puede presentarse en otras zonas del cuerpo.  Hormigueo.  Dolor.  Entumecimiento.  Debilidad muscular.  Sujecin dbil.  Prdida del movimiento de Risk analystuna articulacin. DIAGNSTICO La mayora de las veces los quistes ganglionares se diagnostican en funcin de un examen fsico. El mdico palpar el bulto y puede iluminarlo para ver si la luz pasa a travs de [place]. Si es un quiste ganglionar, la luz suele traspasarlo. El mdico puede indicarle una radiografa, una ecografa o una resonancia magntica para descartar otras afecciones. TRATAMIENTO Generalmente, los quistes ganglionares desaparecen solos sin tratamiento. Si hay dolor u otros sntomas, tal vez se necesite tratamiento. Tambin es necesario un tratamiento si el quiste ganglionar limita sus movimientos o si se infecta. El tratamiento puede incluir lo siguiente:  Usar una frula o una tablilla en la mueca o el dedo.  Medicamentos antiinflamatorios.  Extraer lquido del bulto con Neomia Dearuna aguja (aspiracin).  Inyectar un corticoide en la articulacin.  [place]uga para extirpar Psychologist, forensicel quiste ganglionar. INSTRUCCIONES PARA EL CUIDADO EN EL HOGAR  No haga presin Programmer, multimediasobre el quiste ganglionar, no lo pinche con una aguja ni lo golpee.  Tome los medicamentos solamente como se lo haya indicado el mdico.  Use la frula o la tablilla como se lo haya indicado el mdico.  Controle el quiste ganglionar para Insurance risk surveyordetectar cualquier cambio.  Concurra a todas las visitas de control como se lo haya indicado el mdico. Esto es importante. SOLICITE ATENCIN MDICA SI:  El quiste ganglionar se agranda o le provoca ms dolor.  Aumenta el enrojecimiento, las lneas rojas o la hinchazn.  Observa que sale pus del bulto.  Siente debilidad o adormecimiento alrededor de la zona afectada.  Tiene fiebre o siente  escalofros.   Esta informacin no tiene Theme park managercomo fin reemplazar el consejo del mdico. Asegrese de hacerle al mdico cualquier pregunta que tenga.   Document Released: 08/07/2005 Document Revised: 11/18/2014 Elsevier Interactive Patient Education 2016 Elsevier Inc. Infeccin urinaria  (Urinary Tract Infection)  La infeccin urinaria puede ocurrir en cualquier lugar del tracto urinario. El tracto urinario es un sistema de drenaje del cuerpo por el que se eliminan los desechos y el exceso de [place]. El tracto urinario est formado por dos riones, dos urteres, la vejiga y Engineer, miningla uretra. Los riones son rganos que tienen forma de frijol. Cada rin tiene aproximadamente el tamao del puo. Estn situados debajo de las [place], uno a cada lado de la columna vertebral CAUSAS  La causa de la infeccin son los microbios, que son organismos microscpicos, que incluyen hongos, virus, y bacterias. Estos organismos son tan pequeos que slo pueden verse a travs del microscopio. Las bacterias son los microorganismos que ms comnmente causan infecciones urinarias.  SNTOMAS  Los sntomas pueden variar segn la edad y el sexo del paciente y por la ubicacin de la infeccin. Los sntomas en  las mujeres jvenes incluyen la necesidad frecuente e intensa de orinar y Neomia Dear sensacin dolorosa de ardor en la vejiga o en la uretra durante la miccin. Las mujeres y los hombres mayores podrn sentir cansancio, temblores y debilidad y Futures trader musculares y Engineer, mining abdominal. Si tiene [place], puede significar que la infeccin est en los riones. Otros sntomas son dolor en la espalda o en los lados debajo de las [place], nuseas y vmitos.  DIAGNSTICO  Para diagnosticar una infeccin urinaria, el mdico le preguntar acerca de sus sntomas. [employer] una [place] de [place]. La muestra de orina se analiza para Engineer, manufacturing bacterias y glbulos blancos de Risk manager. Los glbulos blancos se forman en el organismo para  ayudar a Artist las infecciones.  TRATAMIENTO  Por lo general, las infecciones urinarias pueden tratarse con medicamentos. Debido a que la [employer] de las infecciones son causadas por bacterias, por lo general pueden tratarse con antibiticos. La eleccin del antibitico y la duracin del tratamiento depender de sus sntomas y el tipo de bacteria causante de la infeccin.  INSTRUCCIONES PARA EL CUIDADO EN EL HOGAR   Si le recetaron antibiticos, tmelos exactamente como su mdico le indique. Termine el medicamento aunque se sienta mejor despus de haber tomado slo algunos.  Beba gran cantidad de lquido para mantener la orina de tono claro o color amarillo plido.  Evite la cafena, el t y las [address]. Estas sustancias irritan la vejiga.  Vaciar la vejiga con frecuencia. Evite retener la orina durante largos perodos.  Vace la vejiga antes y despus de Management consultant.  Despus de mover el intestino, las mujeres deben higienizarse la regin perineal desde adelante hacia atrs. Use slo un papel tissue por vez. SOLICITE ATENCIN MDICA SI:   Siente dolor en la espalda.  Le sube la fiebre.  Los sntomas no mejoran luego de [address]. SOLICITE ATENCIN MDICA DE INMEDIATO SI:   Siente dolor intenso en la espalda o en la zona inferior del abdomen.  Comienza a sentir escalofros.  Tiene nuseas o vmitos.  Tiene una sensacin continua de quemazn o molestias al [employer]. ASEGRESE DE QUE:   Comprende estas instrucciones.  Controlar su enfermedad.  Solicitar ayuda de inmediato si no mejora o empeora.   Esta informacin no tiene Theme park manager el consejo del mdico. Asegrese de hacerle al mdico cualquier pregunta que tenga.   Document Released: 08/07/2005 Document Revised: 07/22/2012 Elsevier Interactive Patient Education [employer]

## 2016-04-01 NOTE — Progress Notes (Addendum)
Subjective:    Patient ID: Ashley Grant, female    DOB: [birth year], 35 y.o.   MRN: [id number]  HPI Ashley Grant, a 35 year old female presents to establish care. Patient says that she has not been followed by a primary provider over the past several years. Patient is complaining of allergic rhinitis. She states that she has taken Zyrtec in the past with minimal relief. She reports ear itching and eye redness. She denies fatigue, stuffy nose, congestion, nausea, vomiting, and diarrhea.  She is also complaining of obesity. She maintains that she has gained 30 pounds since having baby 2 years ago. She does not exercise or follow a low fat, low calorie diet. She says that she was recently seen at a weight loss clinic and was prescribed Phentermine and Topamax for weight loss. She says that she has lost a total of 6 pounds since starting clinic. She reports a family history of type 2 diabetes mellitus.   She is also complaining of a nodule to right wrist. Nodule has been present for greater than 5 years. She maintains that it is non tender. Nodule does not interfere with activities of daily living such as dressing, cleaning, bathing, or working.   Past Medical History  Diagnosis Date  . Obesity (BMI 30-39.9)     Immunization History  Administered Date(s) Administered  . Tdap 04/01/2014    No Known Allergies  Social History   Social History  . Marital Status: Single    Spouse Name: N/A  . Number of Children: N/A  . Years of Education: N/A   Occupational History  . Not on file.   Social History Main Topics  . Smoking status: Never Smoker   . Smokeless tobacco: Not on file  . Alcohol Use: No  . Drug Use: No  . Sexual Activity: Yes   Other Topics Concern  . Not on file   Social History Narrative   ** Merged History Encounter **       Review of Systems  Constitutional: Positive for fatigue and unexpected weight change (weight gain).  HENT: Negative.    Eyes: Negative.  Negative for photophobia and visual disturbance.  Respiratory: Negative.  Negative for shortness of breath.   Cardiovascular: Negative.  Negative for palpitations and leg swelling.  Gastrointestinal: Negative.  Negative for blood in stool.  Endocrine: Positive for polydipsia and polyphagia. Negative for polyuria.  Genitourinary: Negative.  Negative for dysuria.  Musculoskeletal: Negative.   Skin: Negative.        Right wrist nodule  Allergic/Immunologic: Negative.   Neurological: Negative.   Hematological: Negative.   Psychiatric/Behavioral: Negative.        Objective:   Physical Exam  Constitutional: She is oriented to person, place, and time. She appears well-developed.  HENT:  Head: Normocephalic.  Right Ear: External ear normal.  Mouth/Throat: Oropharynx is clear and moist. Dental caries present.  Eyes: Conjunctivae are normal. Pupils are equal, round, and reactive to light.  Neck: Normal range of motion. Neck supple.  Cardiovascular: Normal rate and normal heart sounds.   Abdominal:  Abdominal obesity  Genitourinary: Vagina normal and uterus normal.  Musculoskeletal: Normal range of motion.  Neurological: She is alert and oriented to person, place, and time. She has normal reflexes.  Skin: Skin is warm and dry.  Psychiatric: She has a normal mood and affect. Her behavior is normal. Judgment and thought content normal.  1 cm, round, raised, movable, painless cyst to right anterior wrist.  BP 131/84 mmHg  Pulse 99  Temp(Src) 98.7 F (37.1 C) (Oral)  Resp 16  Ht  (1.651 m)  Wt 229 lb (103.874 kg)  BMI 38.11 kg/m2  SpO2 100%  LMP 03/29/2016 Assessment & Plan:  1. Environmental allergies Will start a trial of Xyzal HS for environmental allergies.  - levocetirizine (XYZAL) 5 MG tablet; Take 1 tablet (5 mg total) by mouth every evening.  Dispense: 30 tablet; Refill: 1 2. Obesity BMI is 38.11. Recommend a lowfat, low carbohydrate diet  divided over 5-6 small meals, increase water intake to 6-8 glasses, and 150 minutes per week of cardiovascular exercise.   - Hemoglobin A1c - COMPLETE METABOLIC PANEL WITH GFR - TSH - CBC with Differential - Urinalysis Dipstick - POCT urinalysis dip (device)  3. Ganglion cyst of wrist, right - Ambulatory referral to general surgery  4. Dental caries - Ambulatory referral to Dentistry  5. Abnormal finding on urinalysis Positive nitrites - Urine culture  6. Acute cystitis without hematuria - ciprofloxacin (CIPRO) 250 MG tablet; Take 1 tablet (250 mg total) by mouth 2 (two) times daily.  Dispense: 6 tablet; Refill: 0     Routine Health Maintenance: Pap smear in 2 years Recommend monthly self breast exams   RTC: Will follow up by phone with laboratory results and schedule follow-up at that time.     Massie Maroon, FNP

## 2016-04-02 LAB — T3, FREE: T3, Free: 3.4 pg/mL (ref 2.3–4.2)

## 2016-04-02 LAB — HEMOGLOBIN A1C
HEMOGLOBIN A1C: 5.7 % — AB (ref <5.7)
MEAN PLASMA GLUCOSE: 117 mg/dL

## 2016-04-03 ENCOUNTER — Other Ambulatory Visit: Payer: Self-pay | Admitting: Family Medicine

## 2016-04-03 DIAGNOSIS — R7989 Other specified abnormal findings of blood chemistry: Secondary | ICD-10-CM

## 2016-04-03 LAB — T4: T4, Total: 7.9 ug/dL (ref 4.5–12.0)

## 2016-04-04 ENCOUNTER — Telehealth: Payer: Self-pay

## 2016-04-04 LAB — URINE CULTURE

## 2016-04-04 NOTE — Telephone Encounter (Signed)
-----   Message from Massie MaroonLachina M Hollis, [place]NP sent at 04/03/2016 10:41 AM EDT ----- Regarding: lab results Please inform patient that TSH (thyroid stimulating hormone is low, she is to return to office in 1 month to re-check level. Also, hemoglobin a1C is mildly elevated at 5.7, which is consistent with prediabetes. Recommend a lowfat, low carbohydrate diet divided over 5-6 small meals, increase water intake to 6-8 glasses, and 150 minutes per week of cardiovascular exercise.   Thanks  ----- Message -----    From: [facility] in [facility] Interface    Sent: 04/03/2016  10:11 AM      To: Massie MaroonLachina M Hollis, FNP

## 2016-04-04 NOTE — Telephone Encounter (Signed)
Called left message for patient to return call regarding labs. Thanks!

## 2016-04-04 NOTE — Telephone Encounter (Signed)
Called using interpreter ID # [id number] through [facility], advised patient of thyroid levels and that we will recheck in 1 month. Also to work on diet and exercise per china's instructions to help reduce hgba1c level which was elevated. Patient verbalized through interpreter that she understood and had no other questions at this time. Thanks !

## 2016-04-18 NOTE — Addendum Note (Signed)
Addended by: Massie MaroonHOLLIS, Berdena Cisek M on: 04/18/2016 05:20 PM   Modules accepted: Orders

## 2016-06-10 ENCOUNTER — Encounter: Payer: Self-pay | Admitting: Family Medicine

## 2016-06-10 ENCOUNTER — Ambulatory Visit (INDEPENDENT_AMBULATORY_CARE_PROVIDER_SITE_OTHER): Payer: No Typology Code available for payment source | Admitting: Family Medicine

## 2016-06-10 VITALS — BP 127/81 | HR 84 | Temp 98.3°F | Resp 16 | Ht 65.0 in | Wt 241.0 lb

## 2016-06-10 DIAGNOSIS — R635 Abnormal weight gain: Secondary | ICD-10-CM

## 2016-06-10 DIAGNOSIS — E669 Obesity, unspecified: Secondary | ICD-10-CM

## 2016-06-10 LAB — TSH: TSH: 0.88 mIU/L

## 2016-06-10 NOTE — Patient Instructions (Signed)
Might try putting mineral oil drops into ears. Will let you know about your thyroid function.

## 2016-06-11 NOTE — Progress Notes (Signed)
Ashley Grant, is a 35 y.o. female  [id number]  [id number]  DOB - 09/24/1981  CC:  Chief Complaint  Patient presents with  . Weight Gain    wants to loose weight   . Ear Fullness    itching         HPI: Ashley Grant is a 35 y.o. female here for follow-up obesity/weight gain. She is concerned because she has changed her diet but cannot seem to lose weight.She reports eating a lot of salads and dancing for exercise. Her lastest labs showed pre-diabetes with an A1C of 5.7.   Health maintenance. She is in need of PAP smear. She reports negative HIV screening and says her tetanus is up to date.   No Known Allergies Past Medical History:  Diagnosis Date  . Obesity (BMI 30-39.9)    Current Outpatient Prescriptions on File Prior to Visit  Medication Sig Dispense Refill  . ciprofloxacin (CIPRO) 250 MG tablet Take 1 tablet (250 mg total) by mouth 2 (two) times daily. (Patient not taking: Reported on 06/10/2016) 6 tablet 0  . levocetirizine (XYZAL) 5 MG tablet Take 1 tablet (5 mg total) by mouth every evening. (Patient not taking: Reported on 06/10/2016) 30 tablet 1  . phentermine 37.5 MG capsule Take 37.5 mg by mouth every morning.     No current facility-administered medications on file prior to visit.    Family History  Problem Relation Age of Onset  . Diabetes Father   . Diabetes Maternal Grandfather   . Cancer Paternal Grandfather    Social History   Social History  . Marital status: Single    Spouse name: N/A  . Number of children: N/A  . Years of education: N/A   Occupational History  . Not on file.   Social History Main Topics  . Smoking status: Never Smoker  . Smokeless tobacco: Never Used  . Alcohol use No  . Drug use: No  . Sexual activity: Yes   Other Topics Concern  . Not on file   Social History Narrative   ** Merged History Encounter **        Review of Systems: Constitutional: Negative for fever, chills, appetite  change, weight loss. Positive for fatigue Skin: Negative for rashes or lesions of concern. HENT: Negative for ear pain, ear discharge.nose bleeds. Positive for itchy ear canals Eyes: Negative for pain, discharge, redness, itching and visual disturbance. Neck: Negative for pain, stiffness Respiratory: Negative for cough, shortness of breath,   Cardiovascular: Negative for chest pain, palpitations and leg swelling. Gastrointestinal: Negative for abdominal pain, nausea, vomiting, diarrhea, constipations Genitourinary: Negative for dysuria, urgency, frequency, hematuria,  Musculoskeletal: Negative for back pain, joint pain, joint  swelling, and gait problem.Negative for weakness. Neurological: Negative for dizziness, tremors, seizures, syncope,   light-headedness, numbness and headaches.  Hematological: Negative for easy bruising or bleeding Psychiatric/Behavioral: Positive for reported  depression, anxiety   Objective:   Vitals:   06/10/16 1135  BP: 127/81  Pulse: 84  Resp: 16  Temp: 98.3 F (36.8 C)    Physical Exam: Constitutional: Patient appears well-developed and well-nourished. No distress. Obese HENT: Normocephalic, atraumatic, External right and left ear normal. Oropharynx is clear and moist.  Eyes: Conjunctivae and EOM are normal. PERRLA, no scleral icterus. Neck: Normal ROM. Neck supple. No lymphadenopathy, No thyromegaly. CVS: RRR, S1/S2 +, no murmurs, no gallops, no rubs Pulmonary: Effort and breath sounds normal, no stridor, rhonchi, wheezes, rales.  Abdominal: Soft. Normoactive BS,, no distension, tenderness, rebound  or guarding.  Musculoskeletal: Normal range of motion. No edema and no tenderness.  Neuro: Alert.Normal muscle tone coordination. Non-focal Skin: Skin is warm and dry. No rash noted. Not diaphoretic. No erythema. No pallor. Psychiatric: Normal mood and affect. Behavior, judgment, thought content normal.  Lab Results  Component Value Date   WBC 7.0  04/01/2016   HGB 13.3 04/01/2016   HCT 40.9 04/01/2016   MCV 86.3 04/01/2016   PLT 392 04/01/2016   Lab Results  Component Value Date   CREATININE 0.73 04/01/2016   BUN 11 04/01/2016   NA 140 04/01/2016   K 3.9 04/01/2016   CL 105 04/01/2016   CO2 27 04/01/2016    Lab Results  Component Value Date   HGBA1C 5.7 (H) 04/01/2016   Lipid Panel  No results found for: CHOL, TRIG, HDL, CHOLHDL, VLDL, LDLCALC     Assessment and plan:   1. Weight gain  - TSH   Return in about 1 month (around 07/11/2016).  The patient was given clear instructions to go to ER or return to medical center if symptoms don't improve, worsen or new problems develop. The patient verbalized understanding.    Henrietta Hoover FNP  06/11/2016, 9:33 AM

## 2016-07-11 ENCOUNTER — Encounter: Payer: Self-pay | Admitting: Family Medicine

## 2016-07-11 ENCOUNTER — Ambulatory Visit (INDEPENDENT_AMBULATORY_CARE_PROVIDER_SITE_OTHER): Payer: No Typology Code available for payment source | Admitting: Family Medicine

## 2016-07-11 VITALS — BP 125/86 | HR 88 | Temp 97.5°F | Ht 65.0 in | Wt 230.0 lb

## 2016-07-11 DIAGNOSIS — R635 Abnormal weight gain: Secondary | ICD-10-CM

## 2016-07-11 MED ORDER — PHENTERMINE HCL 37.5 MG PO CAPS: 37.5000 mg | ORAL_CAPSULE | ORAL | 0 refills | Status: DC

## 2016-08-12 ENCOUNTER — Other Ambulatory Visit: Payer: Self-pay | Admitting: Family Medicine

## 2016-08-12 ENCOUNTER — Ambulatory Visit (INDEPENDENT_AMBULATORY_CARE_PROVIDER_SITE_OTHER): Payer: No Typology Code available for payment source | Admitting: Family Medicine

## 2016-08-12 ENCOUNTER — Encounter: Payer: Self-pay | Admitting: Family Medicine

## 2016-08-12 VITALS — BP 127/90 | Wt 225.0 lb

## 2016-08-12 DIAGNOSIS — E669 Obesity, unspecified: Secondary | ICD-10-CM

## 2016-08-12 MED ORDER — PHENTERMINE HCL 37.5 MG PO CAPS: 37.5000 mg | ORAL_CAPSULE | ORAL | 0 refills | Status: DC

## 2016-08-12 NOTE — Progress Notes (Signed)
Patient is here for blood pressure and weight and refill on Phentermine. She says she wonders about a hard spot on her side. I advised her per Concepcion LivingLinda Bernhardt, NP to make appointment here to have it evaluated or call back to come in if symptoms increase.

## 2016-09-05 ENCOUNTER — Ambulatory Visit: Payer: No Typology Code available for payment source | Admitting: Family Medicine

## 2016-10-08 ENCOUNTER — Ambulatory Visit: Payer: Self-pay

## 2016-10-08 VITALS — Wt 228.0 lb

## 2016-10-08 DIAGNOSIS — E669 Obesity, unspecified: Secondary | ICD-10-CM

## 2016-10-28 NOTE — Progress Notes (Signed)
Ashley Grant, is a 35 y.o. female  [id number]  [id number]  DOB - 09/14/1981  CC:  Chief Complaint  Patient presents with  . Follow-up    patient is worried because her blood pressure has been high, she also feels palpatations,she feels tired and mad sometimes her trigycerides were elevated elsewhere and thisis also concening to her       HPI: Ashley Grant is a 35 y.o. female here for follow-up. Her complaints are documented above.She is working on weight loss and has lost 11 pounds since July 31. She has been taking phentermine prescribed by me. I have explained her BP is fine today, but phentermine can elevate BP and could cause palpations and agitation.    No Known Allergies Past Medical History:  Diagnosis Date  . Obesity (BMI 30-39.9)    Current Outpatient Prescriptions on File Prior to Visit  Medication Sig Dispense Refill  . ciprofloxacin (CIPRO) 250 MG tablet Take 1 tablet (250 mg total) by mouth 2 (two) times daily. (Patient not taking: Reported on 06/10/2016) 6 tablet 0  . levocetirizine (XYZAL) 5 MG tablet Take 1 tablet (5 mg total) by mouth every evening. (Patient not taking: Reported on 06/10/2016) 30 tablet 1   No current facility-administered medications on file prior to visit.    Family History  Problem Relation Age of Onset  . Diabetes Father   . Diabetes Maternal Grandfather   . Cancer Paternal Grandfather    Social History   Social History  . Marital status: Single    Spouse name: N/A  . Number of children: N/A  . Years of education: N/A   Occupational History  . Not on file.   Social History Main Topics  . Smoking status: Never Smoker  . Smokeless tobacco: Never Used  . Alcohol use No  . Drug use: No  . Sexual activity: Yes   Other Topics Concern  . Not on file   Social History Narrative   ** Merged History Encounter **        Review of Systems: Constitutional: Negative Skin: Negative HENT: Negative  Eyes:  Negative  Neck: Negative Respiratory: Negative Cardiovascular: Negative Gastrointestinal: Negative Genitourinary: Negative  Musculoskeletal: Negative   Neurological: Negative for Hematological: Negative  Psychiatric/Behavioral: Negative    Objective:   Vitals:   07/11/16 1354 07/11/16 1402  BP: 136/80 125/86  Pulse: 98 88  Temp: 97.5 F (36.4 C)     Physical Exam: Constitutional: Patient appears well-developed and well-nourished. No distress. HENT: Normocephalic, atraumatic, External right and left ear normal. Oropharynx is clear and moist.  Eyes: Conjunctivae and EOM are normal. PERRLA, no scleral icterus. Neck: Normal ROM. Neck supple. No lymphadenopathy, No thyromegaly. CVS: RRR, S1/S2 +, no murmurs, no gallops, no rubs Pulmonary: Effort and breath sounds normal, no stridor, rhonchi, wheezes, rales.  Abdominal: Soft. Normoactive BS,, no distension, tenderness, rebound or guarding.  Musculoskeletal: Normal range of motion. No edema and no tenderness.  Neuro: Alert.Normal muscle tone coordination. Non-focal Skin: Skin is warm and dry. No rash noted. Not diaphoretic. No erythema. No pallor. Psychiatric: Normal mood and affect. Behavior, judgment, thought content normal.  Lab Results  Component Value Date   WBC 7.0 04/01/2016   HGB 13.3 04/01/2016   HCT 40.9 04/01/2016   MCV 86.3 04/01/2016   PLT 392 04/01/2016   Lab Results  Component Value Date   CREATININE 0.73 04/01/2016   BUN 11 04/01/2016   NA 140 04/01/2016   K 3.9 04/01/2016  CL 105 04/01/2016   CO2 27 04/01/2016    Lab Results  Component Value Date   HGBA1C 5.7 (H) 04/01/2016   Lipid Panel  No results found for: CHOL, TRIG, HDL, CHOLHDL, VLDL, LDLCALC      Assessment and plan:   1. Obesity -Continue phentermine for another month   Return in about 4 weeks (around 08/08/2016).  The patient was given clear instructions to go to ER or return to medical center if symptoms don't improve, worsen or  new problems develop. The patient verbalized understanding.    Henrietta HooverLinda C Bernhardt FNP  11/25/2016, 10:50 AM

## 2017-05-16 ENCOUNTER — Encounter (HOSPITAL_COMMUNITY): Payer: Self-pay

## 2017-05-16 ENCOUNTER — Ambulatory Visit (INDEPENDENT_AMBULATORY_CARE_PROVIDER_SITE_OTHER): Payer: Self-pay

## 2017-05-16 ENCOUNTER — Ambulatory Visit (HOSPITAL_COMMUNITY)
Admission: EM | Admit: 2017-05-16 | Discharge: 2017-05-16 | Disposition: A | Discharge status: Home / Self Care | Payer: No Typology Code available for payment source | Attending: Internal Medicine | Admitting: Internal Medicine

## 2017-05-16 DIAGNOSIS — M25462 Effusion, left knee: Secondary | ICD-10-CM

## 2017-05-16 DIAGNOSIS — S8992XA Unspecified injury of left lower leg, initial encounter: Secondary | ICD-10-CM

## 2017-05-16 DIAGNOSIS — M25562 Pain in left knee: Secondary | ICD-10-CM

## 2017-05-16 MED ORDER — IBUPROFEN 600 MG PO TABS: 600.0000 mg | ORAL_TABLET | Freq: Four times a day (QID) | ORAL | 0 refills | Status: DC | PRN

## 2017-05-16 NOTE — ED Triage Notes (Signed)
Pt stated she was at the pool area on 7/4 and the floor was wet and she slipped and hurt her left knee. Said it hurts to walk, does have ice on it right now and wanted to get it checked out. No otc meds tried. York SpanielSaid it was concrete

## 2017-05-16 NOTE — Discharge Instructions (Signed)
°No Primary Care Doctor: °Call Health Connect at  [phone] - they can help you locate a primary care doctor that  accepts your insurance, provides certain services, etc. °Physician Referral Service- 1-800-533-3463 ° ° °Emergency Department Resource Guide °1) Find a Doctor and Pay Out of Pocket °Although you won't have to find out who is covered by your insurance plan, it is a good idea to ask around and get recommendations. You will then need to call the office and see if the doctor you have chosen will accept you as a new patient and what types of options they offer for patients who are self-pay. Some doctors offer discounts or will set up payment plans for their patients who do not have insurance, but you will need to ask so you aren't surprised when you get to your appointment. ° °2) Contact Your Local Health Department °Not all health departments have doctors that can see patients for sick visits, but many do, so it is worth a call to see if yours does. If you don't know where your local health department is, you can check in your phone book. The CDC also has a tool to help you locate your state's health department, and many state websites also have listings of all of their local health departments. ° °3) Find a Walk-in Clinic °If your illness is not likely to be very severe or complicated, you may want to try a walk in clinic. These are popping up all over the country in pharmacies, drugstores, and shopping centers. They're usually staffed by nurse practitioners or physician assistants that have been trained to treat common illnesses and complaints. They're usually fairly quick and inexpensive. However, if you have serious medical issues or chronic medical problems, these are probably not your best option. ° °No Primary Care Doctor: °Call Health Connect at  [phone] - they can help you locate a primary care doctor that  accepts your insurance, provides certain services, etc. °Physician Referral Service-  1-800-533-3463 ° °Chronic Pain Problems: °Organization         Address  Phone   Notes  °[facility]  ([phone] Patients need to be referred by their primary care doctor.  ° °Medication Assistance: °Organization         Address  Phone   Notes  °[place] [facility] [address] °[place], [place] [address] (336) 641-8030 --Must be a resident of [place] °-- Must have NO insurance coverage whatsoever (no Medicaid/ Medicare, etc.) °-- The pt. MUST have a primary care doctor that directs their care regularly and follows them in the community °  °[facility]  (866) 331-1348   °United Way  (888) 892-1162   ° °Agencies that provide inexpensive medical care: °Organization         Address                                                       Phone                                                                              Notes  [place]  [phone]   [facility]    ([phone])[phone]   [facility], [place] [address] [phone]   [place] [address], [place] [phone]   Planned Parenthood    [phone]   [facility]    502-209-6613   [place] and [place] [address], [place] Phone:  [phone], Fax:  [phone] Hours of Operation:  9 am - 6 pm, M-F.  Also accepts Medicaid/Medicare and self-pay.  [facility] for [place] [address], [address], [place] Phone: [phone], Fax: [phone]. Hours of Operation:  8:30 am - 5:30 pm, M-F.  Also accepts Medicaid and self-pay.  [facility] [place] [address] [place] Phone: [phone]   [place], [place], [place] [phone], Ext. [phone] Mondays & Thursdays: 7-9 AM.  First 15 patients are seen on a first come, first serve basis.    [place] Providers:  Organization         Address                                                                       Phone                               Notes  [facility] [address], [place] [phone] Also accepts self-pay patients.  [facility] [address], [place]  [phone]   [place], [address], [place] [phone]   [facility] [address] [place] [phone]   Lucianne Lei [address], [place]   [phone] Only accepts [place] Access [place] patients after they have their name applied to their card.   Self-Pay (no insurance) in [facility]:   Organization         Address                                                     Phone               Notes  Sickle Cell Patients, [facility] [place] [phone]   [facility] [place] [phone]   [facility]  [place], [place], [place] [phone]   [facility]/Dr. Osei-Bonsu  [address], [place] or [address], [place] [phone] Phone number for both [place] and [place] locations is the same.  Urgent Medical and [facility] [address], [place] [phone]   [facility] [address], [place] or [address] [phone]   [place] [facility] [place] [phone], phone; [phone], fax Sees patients 1st and 3rd Saturday of every month.  Must not qualify for public or private insurance (i.e. Medicaid, Medicare, [place] Health Choice, Veterans' Benefits)  Household income should be no more than 200% of the poverty level The clinic cannot treat you if you are pregnant or think you are pregnant  Sexually  transmitted diseases are not treated at the clinic.    Dental Care: Organization         Address                                  Phone                       Notes  [facility] [place] [phone] Accepts children up to age 54 who are enrolled in [place] or [place]; pregnant women with a Medicaid card; and children who have applied for Medicaid or [place] Health Choice, but were declined, whose parents can pay a reduced fee at time of service.  [facility]  [address], [place] [phone] Accepts children up to age 43 who are enrolled in [place] or [place]; pregnant women with a Medicaid card; and children who have applied for Medicaid or [place] Health Choice, but were declined, whose parents can pay a reduced fee at time of service.  [facility]  [place] [phone] Patients are seen by appointment only. Walk-ins are not accepted. [place] will see patients 34 years of age and older. Monday - Tuesday (8am-5pm) Most Wednesdays (8:30-5pm) $30 per visit, cash only  [facility] Adult Dental Access PROGRAM  [address], [facility] [phone] Patients are seen by appointment only. Walk-ins are not accepted. [place] will see patients 47 years of age and older. One Wednesday Evening (Monthly: Volunteer Based).  $30 per visit, cash only  [place]  [phone] for adults; Children under age 51, call Graduate Pediatric Dentistry at [phone]. Children aged 30-14, please call [phone] to request a pediatric application.  Dental services are provided in all areas of dental care including fillings, crowns and bridges, complete and partial dentures, implants, gum treatment, root canals, and extractions. Preventive care is also provided. Treatment is  provided to both adults and children. Patients are selected via a lottery and there is often a waiting list.   [facility] [address] [place]  ([phone] [email or web address]   [facility] [address] [place], [place] [phone], Ext. [phone] Second and Fourth Thursday of each month, opens at 6:30 AM; Clinic ends at 9 AM.  Patients are seen on a first-come first-served basis, and a limited number are seen during each clinic.   [facility]  [address] [place]ard Danker [place], [place] [phone]   Eligibility Requirements You must have lived in [place], [place], or [place] counties for at least the last three months.   You cannot be eligible for state or federal sponsored [employer], including [employer], [place], or [employer].   You generally cannot be eligible for healthcare insurance through your employer.    How to apply: Eligibility screenings are held every  Tuesday and Wednesday afternoon from 1:00 pm until 4:00 pm. You do not need an appointment for the interview!  [facility] [address], [place], [place]   [place]  [place] Department  [place]  [phone]    Behavioral Health Resources in the Community: Intensive Outpatient Programs Organization         Address                                              Phone              Notes  [place]. [address], [place], [place] [phone]   [facility]patient [address], [place], [place]   [facility] [address] [place], [place]   [place] [address],  [place], [place] or ([phone]   Substance Abuse Resources Organization         Address                                Phone  Notes  Alcohol and Drug  Services  [phone]   [place]  [phone]   [facility] [place]   [place] Pester  [phone]   Residential & Outpatient Substance Abuse Program  (629)078-3404   Psychological Services Organization         Address                                  Phone                Notes  [facility] [place]  [place]  [phone]   [place] [address] [place] or [phone]    Mobile Crisis Teams Organization         Address  Phone  Notes  [facility], Mobile Crisis Care Unit  [phone]   [facility]  [address] [place], [place]   [place] [address] [place] [phone]    Self-Help/Support Groups Organization         Address                         Phone             Notes  [place]. of [place] - variety of support groups  [place] Call for more information  Narcotics Anonymous (NA), [facility] [address], [employer] [place]  2 meetings at this location   Special educational needs teacher         Address                                                    Phone              Notes  [facility] [phone]  [address],    °[place]  1-866-801-8205   °[facility] ° [address], [place], [place] [phone]   °[facility] [address], [place] [phone] Admissions: 8am-3pm M-F  °[facility] [address]    °[place], [place] [phone]   °[facility] [address], [place], [place] [phone]   °[facility] [address]  °[place], [place] [phone]   °[facility] - Intensive Outpatient [address], , [place] [phone]   °[facility] [address]  °[place], [place] 1-877-615-2722 or  [phone]   °[facility] ([facility]) [address] [place], [place] [phone] Accepts Medicaid  °[facility] [address]  ° [place] 1-800-659-3381 Substance Abuse/Addiction Treatment  ° °[place] Behavioral Health Resources °Organization         Address                                                            Phone                    Notes  °[facility]  (888) [phone]   °Julie Brannon, PhD [address] [place], [place]   (336) 349-5553 or (336) 951-0000   °[facility]   [address] °[place], [place] (336) 349-4454   °[facility] [address], [place], [place] (336) [phone] Insurance/Medicaid/sponsorship through [facility]  °[facility] [address]                                    [place], [place] (336) [phone] Therapy/tele-psych/case  °[facility] [address]  ° [place], [place] (336) [phone]    °Dr. Arfeen  ([phone]) [phone]   °[facility] [place]  [employer] [place] [facility] 1) [address], [place] °2) [address], [place] °3)  [address], [place] ([phone]) [phone] °(336) [phone] ° °(336) [phone]   °[place] Child Abuse Hotline (336) 342-1394 or (336) 342-3537 (After Hours)    ° ° ° °

## 2017-05-16 NOTE — ED Provider Notes (Signed)
CSN: [id number]     Arrival date & time 05/16/17  1358 History   First MD Initiated Contact with Patient 05/16/17 1521     Chief Complaint  Patient presents with  . Fall   (Consider location/radiation/quality/duration/timing/severity/associated sxs/prior Treatment) HPI  Ashley Grant is a 36 y.o. female presenting to UC with c/o Left knee pain and swelling that started 2 days ago after slip and fall in the bathroom at a pool due to wet floors. Pt notes before she fell to the concrete floor her knee twisted and she felt and heard "popping" sounds.  Pain is minimal to 0/10 at rest but aching, 5/10 with movement including full extension of her knee and ambulation.  Denies other injuries from the fall.     Past Medical History:  Diagnosis Date  . Obesity (BMI 30-39.9)    Past Surgical History:  Procedure Laterality Date  . CESAREAN SECTION N/A 04/08/2014   Procedure: CESAREAN SECTION;  Surgeon: Adam PhenixJames G Arnold, MD;  Location: [facility];  Service: Obstetrics;  Laterality: N/A;   Family History  Problem Relation Age of Onset  . Diabetes Father   . Diabetes Maternal Grandfather   . Cancer Paternal Grandfather    Social History  Substance Use Topics  . Smoking status: Never Smoker  . Smokeless tobacco: Never Used  . Alcohol use No   OB History    Gravida Para Term Preterm AB Living   1 1 1     1    SAB TAB Ectopic Multiple Live Births           1     Review of Systems  Musculoskeletal: Positive for arthralgias, gait problem, joint swelling and myalgias.  Skin: Positive for color change. Negative for wound.  Neurological: Positive for weakness (Left knee due to pain). Negative for numbness.    Allergies  Patient has no known allergies.  Home Medications   Prior to Admission medications   Medication Sig Start Date End Date Taking? Authorizing Provider  ciprofloxacin (CIPRO) 250 MG tablet Take 1 tablet (250 mg total) by mouth 2 (two) times daily. Patient not taking:  Reported on 06/10/2016 04/01/16   Massie MaroonHollis, [name], FNP  ibuprofen (ADVIL,MOTRIN) 600 MG tablet Take 1 tablet (600 mg total) by mouth every 6 (six) hours as needed. 05/16/17   Lurene ShadowPhelps, Della Scrivener O, PA-C  levocetirizine (XYZAL) 5 MG tablet Take 1 tablet (5 mg total) by mouth every evening. Patient not taking: Reported on 06/10/2016 04/01/16   Massie MaroonHollis, [name], FNP  phentermine 37.5 MG capsule Take 1 capsule (37.5 mg total) by mouth every morning. 08/12/16   Henrietta HooverBernhardt, Linda C, NP   Meds Ordered and Administered this Visit  Medications - No data to display  BP 116/77 (BP Location: Left Arm)   Pulse 97   Temp 98.7 F (37.1 C) (Oral)   Resp 20   LMP 04/25/2017 (Approximate)   SpO2 100%   Breastfeeding? No  No data found.   Physical Exam  Constitutional: She is oriented to person, place, and time. She appears well-developed and well-nourished.  HENT:  Head: Normocephalic and atraumatic.  Eyes: EOM are normal.  Neck: Normal range of motion.  Cardiovascular: Normal rate.   Pulmonary/Chest: Effort normal.  Musculoskeletal: She exhibits edema and tenderness.  Left knee: mild to moderate edema. Mild tenderness to lateral joint space. Full flexion. Slight limitation to extension due to pain with full extension.  Calf is soft, non-tender.  Neurological: She is alert and oriented to  person, place, and time.  Skin: Skin is warm and dry.  Psychiatric: She has a normal mood and affect. Her behavior is normal.  Nursing note and vitals reviewed.   Urgent Care Course     Procedures (including critical care time)  Labs Review Labs Reviewed - No data to display  Imaging Review Dg Knee Complete 4 Views Left  Result Date: 05/16/2017 CLINICAL DATA:  Per pt: on 7/4 at the pool and slipped injured the left knee. Pain is left knee, none weight bearing is medial patella, when trying to walk or just put weight onto the left leg, the entire left knee hurts. No prior injury to the left knee. Patient is not a  diabetic EXAM: LEFT KNEE - COMPLETE 4+ VIEW COMPARISON:  None. FINDINGS: No fracture or bone lesion. Knee joint is normally spaced and aligned. There is a moderate joint effusion. Mild anterior to subcutaneous soft tissue edema is noted. IMPRESSION: 1. No fracture or dislocation.  No bone lesion. 2. Moderate joint effusion which suggests an internal derangement. Consider follow-up left knee MRI for further assessment. Electronically Signed   By: Amie Portland M.D.   On: 05/16/2017 15:42     MDM   1. Left knee injury, initial encounter   2. Left knee pain   3. Effusion of left knee joint    Discussed imaging with pt.  Concern for soft tissue injury such as LCL and/or lateral meniscus   Discussed using knee immobilizer, however, pain worse when knee completely straight.  Pt placed in knee sleeve and provided crutches for comfort. Rx: ibuprofen F/u with Charma Igo, orthopedics.   Pt also asking about PCP resources. Resource guide provided.     Lurene Shadow, [place] 05/16/17 1627

## 2017-05-19 ENCOUNTER — Encounter (INDEPENDENT_AMBULATORY_CARE_PROVIDER_SITE_OTHER): Payer: Self-pay | Admitting: Orthopaedic Surgery

## 2017-05-19 ENCOUNTER — Ambulatory Visit (INDEPENDENT_AMBULATORY_CARE_PROVIDER_SITE_OTHER): Payer: Self-pay | Admitting: Orthopaedic Surgery

## 2017-05-19 DIAGNOSIS — M25562 Pain in left knee: Secondary | ICD-10-CM | POA: Insufficient documentation

## 2017-05-19 NOTE — Progress Notes (Signed)
Office Visit Note   Patient: Ashley Grant           Date of Birth: [birth year]           MRN: [id number] Visit Date: 05/19/2017              Requested by: Massie MaroonHollis, [name], FNP [address] [place], [place] [id number] PCP: Massie MaroonHollis, [name], FNP   Assessment & Plan: Visit Diagnoses:  1. Acute pain of left knee     Plan: MRI of the left knee ordered to rule out internal derangement given high suspicion of anterior cruciate ligament versus bucket-handle meniscus tear. Knee immobilizer was given. Follow-up after MRI.  Follow-Up Instructions: Return in about 10 days (around 05/29/2017).   Orders:  No orders of the defined types were placed in this encounter.  No orders of the defined types were placed in this encounter.     Procedures: No procedures performed   Clinical Data: No additional findings.   Subjective: Chief Complaint  Patient presents with  . Left Knee - Pain    Patient is a 36 year old female comes in with acute left knee injury in which she slipped and fell and heard a pop in her knee. She [facility] x-ray showed a joint effusion. She has pain with weightbearing and feels like the knee to pop. She denies any radiation of pain. Denies any numbness or tingling.    Review of Systems  Constitutional: Negative.   HENT: Negative.   Eyes: Negative.   Respiratory: Negative.   Cardiovascular: Negative.   Endocrine: Negative.   Musculoskeletal: Negative.   Neurological: Negative.   Hematological: Negative.   Psychiatric/Behavioral: Negative.   All other systems reviewed and are negative.    Objective: Vital Signs: LMP 04/25/2017 (Approximate)   Physical Exam  Constitutional: She is oriented to person, place, and time. She appears well-developed and well-nourished.  HENT:  Head: Normocephalic and atraumatic.  Eyes: EOM are normal.  Neck: Neck supple.  Pulmonary/Chest: Effort normal.  Abdominal: Soft.    Neurological: She is alert and oriented to person, place, and time.  Skin: Skin is warm. Capillary refill takes less than 2 seconds.  Psychiatric: She has a normal mood and affect. Her behavior is normal. Judgment and thought content normal.  Nursing note and vitals reviewed.   Ortho Exam Left knee exam shows a joint effusion. Collaterals are stable. She does have a lax Lachman's test. No significant joint line tenderness. Specialty Comments:  No specialty comments available.  Imaging: No results found.   PMFS History: Patient Active Problem List   Diagnosis Date Noted  . Acute pain of left knee 05/19/2017  . Obesity 04/01/2016  . Environmental allergies 04/01/2016  . Ganglion cyst of wrist 04/01/2016  . Abnormal finding on urinalysis 04/01/2016  . S/P C-section 04/09/2014  . Gestational HTN 04/06/2014   Past Medical History:  Diagnosis Date  . Obesity (BMI 30-39.9)     Family History  Problem Relation Age of Onset  . Diabetes Father   . Diabetes Maternal Grandfather   . Cancer Paternal Grandfather     Past Surgical History:  Procedure Laterality Date  . CESAREAN SECTION N/A 04/08/2014   Procedure: CESAREAN SECTION;  Surgeon: Adam PhenixJames G Arnold, MD;  Location: [facility];  Service: Obstetrics;  Laterality: N/A;   Social History   Occupational History  . Not on file.   Social History Main Topics  . Smoking status:  Never Smoker  . Smokeless tobacco: Never Used  . Alcohol use No  . Drug use: No  . Sexual activity: Yes

## 2017-05-21 ENCOUNTER — Ambulatory Visit: Payer: Self-pay | Attending: Internal Medicine

## 2017-05-27 ENCOUNTER — Ambulatory Visit: Payer: Self-pay

## 2017-05-28 ENCOUNTER — Ambulatory Visit: Payer: Self-pay | Attending: Internal Medicine

## 2017-06-02 ENCOUNTER — Ambulatory Visit (INDEPENDENT_AMBULATORY_CARE_PROVIDER_SITE_OTHER): Payer: Self-pay | Admitting: Orthopaedic Surgery

## 2017-06-02 ENCOUNTER — Encounter (INDEPENDENT_AMBULATORY_CARE_PROVIDER_SITE_OTHER): Payer: Self-pay

## 2017-06-02 DIAGNOSIS — M25562 Pain in left knee: Secondary | ICD-10-CM

## 2017-06-02 NOTE — Progress Notes (Signed)
Appointment was rescheduled.

## 2017-06-17 ENCOUNTER — Other Ambulatory Visit: Payer: Self-pay

## 2018-02-02 ENCOUNTER — Ambulatory Visit: Payer: Self-pay | Attending: Family Medicine

## 2018-03-20 ENCOUNTER — Encounter: Payer: Self-pay | Admitting: Family Medicine

## 2018-03-20 ENCOUNTER — Ambulatory Visit (INDEPENDENT_AMBULATORY_CARE_PROVIDER_SITE_OTHER): Payer: No Typology Code available for payment source | Admitting: Family Medicine

## 2018-03-20 VITALS — BP 110/74 | HR 80 | Temp 98.1°F | Ht 65.0 in | Wt 265.0 lb

## 2018-03-20 DIAGNOSIS — N912 Amenorrhea, unspecified: Secondary | ICD-10-CM

## 2018-03-20 DIAGNOSIS — Z131 Encounter for screening for diabetes mellitus: Secondary | ICD-10-CM

## 2018-03-20 DIAGNOSIS — L680 Hirsutism: Secondary | ICD-10-CM

## 2018-03-20 DIAGNOSIS — E8881 Metabolic syndrome: Secondary | ICD-10-CM

## 2018-03-20 DIAGNOSIS — R21 Rash and other nonspecific skin eruption: Secondary | ICD-10-CM

## 2018-03-20 DIAGNOSIS — E282 Polycystic ovarian syndrome: Secondary | ICD-10-CM

## 2018-03-20 LAB — POCT URINALYSIS DIP (MANUAL ENTRY)
Bilirubin, UA: NEGATIVE
GLUCOSE UA: NEGATIVE mg/dL
Ketones, POC UA: NEGATIVE mg/dL
NITRITE UA: NEGATIVE
Protein Ur, POC: 100 mg/dL — AB
Spec Grav, UA: 1.02 (ref 1.010–1.025)
Urobilinogen, UA: 0.2 E.U./dL
pH, UA: 6 (ref 5.0–8.0)

## 2018-03-20 LAB — POCT GLYCOSYLATED HEMOGLOBIN (HGB A1C): Hemoglobin A1C: 5.5

## 2018-03-20 MED ORDER — TRIAMCINOLONE ACETONIDE 0.5 % EX OINT: 1.0000 "application " | TOPICAL_OINTMENT | Freq: Two times a day (BID) | CUTANEOUS | 1 refills | Status: DC

## 2018-03-20 MED FILL — TRIAMCINOLONE 0.5% OINTMENT: 0.5 | 15 days supply | Qty: 30 | Fill #0

## 2018-03-20 NOTE — Progress Notes (Signed)
Subjective:    Patient ID: Ashley Grant, female    DOB: [birth year], 37 y.o.   MRN: [id number]  HPI Ashley Grant, a 37 year old female with a history of morbid obesity presents complaining of weight gain, abnormal hair growth and amenorrhea. Patient says that she was previously evaluated at the [facility] Department for abnormal menstrual cycles. She was advised to follow up with PCP due to other symptoms involved. Patient has been having weight gain despite low fat diet and exercise. Also, she endorses abnormal menses and hair growth to chest and chin. Body mass index is 44.1 kg/m. Patient states that weight has increased by 30 pounds over the past 6 months. She has a family history of obesity and type 2 diabetes mellitus. She denies intolerance to heat/cold, constipation, hair loss to scalp, or depression.   Past Medical History:  Diagnosis Date  . Obesity (BMI 30-39.9)    Social History   Socioeconomic History  . Marital status: Single    Spouse name: Not on file  . Number of children: Not on file  . Years of education: Not on file  . Highest education level: Not on file  Occupational History  . Not on file  Social Needs  . Financial resource strain: Not on file  . Food insecurity:    Worry: Not on file    Inability: Not on file  . Transportation needs:    Medical: Not on file    Non-medical: Not on file  Tobacco Use  . Smoking status: Never Smoker  . Smokeless tobacco: Never Used  Substance and Sexual Activity  . Alcohol use: No  . Drug use: No  . Sexual activity: Yes  Lifestyle  . Physical activity:    Days per week: Not on file    Minutes per session: Not on file  . Stress: Not on file  Relationships  . Social connections:    Talks on phone: Not on file    Gets together: Not on file    Attends religious service: Not on file    Active member of club or organization: Not on file    Attends meetings of clubs or organizations: Not on  file    Relationship status: Not on file  . Intimate partner violence:    Fear of current or ex partner: Not on file    Emotionally abused: Not on file    Physically abused: Not on file    Forced sexual activity: Not on file  Other Topics Concern  . Not on file  Social History Narrative   ** Merged History Encounter **       Review of Systems  Constitutional: Positive for unexpected weight change (weight gain).  HENT: Negative.   Eyes: Negative.   Respiratory: Negative.   Cardiovascular: Negative.  Negative for chest pain, palpitations and leg swelling.  Endocrine: Negative.   Genitourinary: Negative.   Musculoskeletal: Negative.   Skin: Negative.   Allergic/Immunologic: Negative.   Neurological: Negative.   Hematological: Negative.   Psychiatric/Behavioral: Negative.        Objective:   Physical Exam  Constitutional: She appears well-developed and well-nourished.  obesity  Eyes: Pupils are equal, round, and reactive to light.  Neck: Normal range of motion.  Cardiovascular: Normal rate and regular rhythm.  Pulmonary/Chest: Effort normal and breath sounds normal.  Abdominal: Soft. Bowel sounds are normal.  Abdominal obesity   Musculoskeletal: Normal range of motion.  Skin: Skin is warm and dry.  BP 110/74 (BP Location: Right Arm, Patient Position: Sitting, Cuff Size: Large)   Pulse 80   Temp 98.1 F (36.7 C) (Oral)   Ht  (1.651 m)   Wt 265 lb (120.2 kg)   LMP 03/20/2018   SpO2 96%   BMI 44.10 kg/m  Assessment & Plan:  1. Morbid obesity (HCC) Goal is to decrease weight by 10% over the next 3 months. Discussed diet and exercise at length. Patient is requesting weight loss pills. I will not prescribe at this time.   2. Screening for diabetes mellitus - POCT glycosylated hemoglobin (Hb A1C) - POCT urinalysis dipstick  3. Amenorrhea - US PELVIC COMPLETE WITH TRANSVAGINAL; Future  4. Metabolic syndrome Recommend a lowfat, low carbohydrate diet  divided over 5-6 small meals, increase water intake to 6-8 glasses, and 150 minutes per week of cardiovascular exercise.   - Thyroid Panel With TSH  5. Hirsutism - Testosterone  6. Skin eruption - triamcinolone ointment (KENALOG) 0.5 %; Apply 1 application topically 2 (two) times daily.  Dispense: 30 g; Refill: 1  7. PCOS (polycystic ovarian syndrome) I suspect PCOS due to ongoing symptoms. Will review pelvic/transvaginal u/s - metFORMIN (GLUCOPHAGE) 500 MG tablet; Take 1 tablet (500 mg total) by mouth daily with breakfast.  Dispense: 30 tablet; Refill: 0  RTC: 1 month for chronic conditions  Nolon Nations  MSN, FNP-C Patient Care [facility] [address] [place], [place] [address] [phone]

## 2018-03-20 NOTE — Patient Instructions (Signed)
Will follow up by phone with any abnormal laboratory values.    Dieta para el sndrome ovrico poliqustico (Diet for Polycystic Ovarian Syndrome) El sndrome ovrico poliqustico (SOP) es un trastorno de los mensajeros qumicos (hormonas) que Charity fundraiser. La afeccin causa el desequilibrio de hormonas importantes. El SOP puede causar lo siguiente:  Menstruaciones irregulares o la ausencia de la [place].  El desarrollo de quistes en los ovarios.  Dificultad para quedar embarazada.  La inhibicin de la respuesta del organismo a los efectos de la insulina (resistencia a la [place]), lo que puede derivar en obesidad y diabetes. Modificar la dieta puede ayudar a Retail banker bajo control y a Scientist, clinical (histocompatibility and immunogenetics) su estado de [place]. Puede ser de ayuda para bajar de peso y Scientist, clinical (histocompatibility and immunogenetics) la forma en que el organismo hace uso de la insulina. EN QU CONSISTE EL PLAN?  [place], almuerce y cene, y [address] colaciones diarias.  Incluya protenas en cada comida y colacin.  Elija los Counselling psychologist de los productos elaborados con [place].  Consuma diferentes alimentos.  Haga actividad fsica habitualmente como se lo haya indicado el mdico. QU DEBO SABER ACERCA DE ESTE PLAN DE ALIMENTACIN? Si tiene sobrepeso u obesidad, preste atencin a la cantidad de Medtronic. Reducir las caloras puede ayudarlo a Publishing copy de [place]. Trabaje con el mdico o el nutricionista para saber cuntas caloras Psychologist, forensic. QU ALIMENTOS PUEDO COMER? Cereales Cereales integrales, como salvado. Panes, galletitas, cereales y pastas integrales. Avena sin endulzar, trigo, [place], quinua o arroz integral. Tortillas de harina de maz o de salvado. Hoover Brunette Deatra James. Espinaca. Guisantes. Remolachas. Coliflor. Repollo. Brcoli. Zanahorias. Tomates. Calabaza. Christella Noa. Hierbas. Pimienta. Cebollas. Pepinos. Repollitos de Bruselas. Frutas Frutos rojos. Bananas. Manzanas. Naranjas.  Uvas. Papaya. Mango. Granada. Kiwi. Pomelo. Cerezas. Carnes y [address] fuentes de protenas Protenas magras, como pescado, pollo, frijoles, huevos y tofu. Lcteos Productos lcteos con bajo contenido de [place], como Pump Back, palitos de queso y Dentist. Bebidas Bebidas con bajo contenido de grasa o sin Kelvin Cellar semidescremada, [place] sin azcar y jugo 100% de frutas. Condimentos [place]. Mostaza. Salsa barbacoa. Salsa de pepinillos. Mayonesa con bajo contenido de grasa o sin grasa. Grasas y aceites Aceite de oliva o de canola. Nueces y [place]. Los artculos mencionados arriba pueden no ser [employer]on de las bebidas o los alimentos recomendados. Comunquese con el nutricionista para conocer ms opciones. QU ALIMENTOS NO SE RECOMIENDAN? Alimentos con alto contenido de caloras o Holiday representative. Comidas fritas. Dulces. Los productos elaborados con harina blanca refinada, entre ellos, pan blanco, pasteles, arroz blanco y pastas. Los artculos mencionados arriba pueden no ser Raytheon de las bebidas y los alimentos que se Theatre stage manager. Comunquese con el nutricionista para obtener ms informacin. Esta informacin no tiene Theme park manager el consejo del mdico. Asegrese de hacerle al mdico cualquier pregunta que tenga. Document Released: 02/19/2016 Document Revised: 02/19/2016 Document Reviewed: 11/09/2014 Elsevier Interactive Patient Education  2018 [employer].

## 2018-03-21 LAB — THYROID PANEL WITH TSH
Free Thyroxine Index: 2 (ref 1.2–4.9)
T3 UPTAKE RATIO: 23 % — AB (ref 24–39)
T4 TOTAL: 8.8 ug/dL (ref 4.5–12.0)
TSH: 1.2 u[IU]/mL (ref 0.450–4.500)

## 2018-03-21 LAB — TESTOSTERONE: TESTOSTERONE: 37 ng/dL (ref 8–48)

## 2018-03-24 ENCOUNTER — Ambulatory Visit (HOSPITAL_COMMUNITY)
Admission: RE | Admit: 2018-03-24 | Discharge: 2018-03-24 | Disposition: A | Discharge status: Home / Self Care | Payer: No Typology Code available for payment source | Source: Ambulatory Visit | Attending: Family Medicine | Admitting: Family Medicine

## 2018-03-24 DIAGNOSIS — N912 Amenorrhea, unspecified: Secondary | ICD-10-CM | POA: Insufficient documentation

## 2018-03-25 ENCOUNTER — Other Ambulatory Visit: Payer: Self-pay | Admitting: Family Medicine

## 2018-03-26 ENCOUNTER — Telehealth: Payer: Self-pay

## 2018-03-26 ENCOUNTER — Encounter: Payer: Self-pay | Admitting: Family Medicine

## 2018-03-26 DIAGNOSIS — E282 Polycystic ovarian syndrome: Secondary | ICD-10-CM | POA: Insufficient documentation

## 2018-03-26 DIAGNOSIS — L680 Hirsutism: Secondary | ICD-10-CM | POA: Insufficient documentation

## 2018-03-26 MED ORDER — METFORMIN HCL 500 MG PO TABS: 500.0000 mg | ORAL_TABLET | Freq: Every day | ORAL | 0 refills | Status: DC

## 2018-03-26 MED FILL — metFORMIN HCL 500 MG TABS: 500 | 30 days supply | Qty: 30 | Fill #0

## 2018-03-26 NOTE — Telephone Encounter (Signed)
-----   Message from Massie Maroon, [place] sent at 03/26/2018 10:56 AM EDT ----- Please inform patient that pelvic/transvaginal ultrasound unremarkable. Suspect PCOS. Will start a trial of Metformin 500 mg daily with breakfast. Recommend a lowfat, low carbohydrate diet divided over 5-6 small meals, increase water intake to 6-8 glasses, and 150 minutes per week of cardiovascular exercise. Advise to follow up as scheduled.   Nolon Nations  MSN, FNP-C Patient Care [facility] [address] [place], [place] [address] [phone]

## 2018-03-26 NOTE — Telephone Encounter (Signed)
Called and spoke with patient using Language Resources [phone]. Advised of unremarkable ultrasound and that it is suspected PCOS and that we will start a trial of metformin  once daily with breakfast. Patient states understanding. Thanks!

## 2018-04-15 ENCOUNTER — Encounter: Payer: Self-pay | Admitting: *Deleted

## 2018-04-20 ENCOUNTER — Ambulatory Visit: Payer: No Typology Code available for payment source | Admitting: Family Medicine

## 2018-04-23 ENCOUNTER — Encounter: Payer: Self-pay | Admitting: Family Medicine

## 2018-04-23 ENCOUNTER — Ambulatory Visit (INDEPENDENT_AMBULATORY_CARE_PROVIDER_SITE_OTHER): Payer: No Typology Code available for payment source | Admitting: Family Medicine

## 2018-04-23 VITALS — BP 117/76 | HR 87 | Temp 99.0°F | Resp 16 | Ht 65.0 in | Wt 267.0 lb

## 2018-04-23 DIAGNOSIS — E669 Obesity, unspecified: Secondary | ICD-10-CM

## 2018-04-23 DIAGNOSIS — L680 Hirsutism: Secondary | ICD-10-CM

## 2018-04-23 DIAGNOSIS — E282 Polycystic ovarian syndrome: Secondary | ICD-10-CM

## 2018-04-23 NOTE — Patient Instructions (Signed)
Dieta para el sndrome metablico (Diet for Metabolic Syndrome) El sndrome metablico es un trastorno que incluye al menos tres de estas afecciones:  Obesidad abdominal.  Manson AllanExceso de azcar en la sangre.  Hipertensin arterial.  Una cantidad ms elevada de lo normal de grasa (lpidos) en la sangre.  Un nivel por debajo de lo normal de colesterol "bueno" (HDL). Seguir una dieta saludable puede ayudar a mantener el sndrome metablico bajo control. Tambin puede ayudar a IT sales professionalevitar el desarrollo de afecciones asociadas con este sndrome, como diabetes, cardiopata coronaria e ictus. Junto con el ejercicio, una dieta saludable:  Ayuda a mejorar la forma en que el organismo Botswanausa la Beallsvilleinsulina.  Promueve la prdida de Taconitepeso. Un objetivo comn de las personas con esta afeccin es Publishing copybajar por lo menos entre el siete y el diez por ciento del peso inicial. QU DEBO SABER ACERCA DE ESTA DIETA?  Use el ndice glucmico (IG) para planificar las comidas. El ndice le informa con qu rapidez un alimento elevar su nivel de azcar en la sangre. Elija alimentos con bajos valores de ndice glucmico. Estos tardan ms tiempo en subir el nivel de Bankerazcar en la sangre.  Lleve un registro de la cantidad de caloras que ingiere. La ingesta de la cantidad correcta de caloras lo ayudar a Baristaalcanzar un peso saludable.  Tal vez deba seguir Clinical cytogeneticistuna dieta mediterrnea. Esta dieta incluye gran cantidad de verduras, carnes magras de pescado, cereales integrales, frutas, as como aceites y grasas saludables. QU ALIMENTOS PUEDO COMER? Cereales Salvado molido. Pan integral de centeno. Pan, galletitas, tortillas, cereales y pastas integrales. Avena sin azcar.Trigo burgol.Cebada.Quinua.Arroz integral y arroz salvaje. Hoover BrunetteVerduras Deatra JamesLechuga. Espinaca. Guisantes. Remolachas. Coliflor. Repollo. Brcoli. Zanahorias. Tomates. Calabaza. Christella NoaBerenjena. Hierbas. Pimienta. Cebollas. Pepinos. Repollitos de Bruselas. Batatas. ames. Frijoles.  Lentejas. Frutas Frutos rojos. Manzanas. Naranjas. Uvas. Mango. Granada. Kiwi. Cerezas. Carnes y otras fuentes de protenas Frutos de mar y Oceanographermariscos. Carnes magras.Aves. Tofu. Lcteos Productos lcteos sin grasa o con bajo contenido de [place]grasa, [place] leche, yogur y Forneyqueso. CHS IncBebidas Agua. Leche con bajo contenido de Roselandgrasa. Productos alternativos de la Mount Vernonleche, [address]com[address] leche de soja o de Bassettalmendra. Jugo de frutas naturales. Condimentos [place] con bajo contenido de International aid/development workerazcar o sin azcar, salsa barbacoa y Honalomayonesa. Mostaza. Salsa de pepinillos. Grasas y Arts development officeraceites [place]. Aceite de canola o de oliva. Nueces y Los Huisachesmantequillas de frutos secos.Semillas. Los artculos mencionados arriba pueden no ser Raytheonuna lista completa de las bebidas o los alimentos recomendados. Comunquese con el nutricionista para conocer ms opciones. QU ALIMENTOS NO SE RECOMIENDAN? Carne roja. Aceite de palma y de coco. Alimentos procesados. Comidas fritas. Alcohol. Bebidas azucaradas, como t helado y gaseosas. Dulces. Alimentos salados. Los artculos mencionados arriba pueden no ser Raytheonuna lista completa de las bebidas y los alimentos que se Theatre stage managerdeben evitar. Comunquese con el nutricionista para obtener ms informacin. Esta informacin no tiene Theme park managercomo fin reemplazar el consejo del mdico. Asegrese de hacerle al mdico cualquier pregunta que tenga. Document Released: 03/14/2015 Document Revised: 03/14/2015 Document Reviewed: 11/09/2014 Elsevier Interactive Patient Education  2018 [employer]

## 2018-04-23 NOTE — Progress Notes (Signed)
Subjective:    Patient ID: Ashley Grant, female    DOB: 10-16-1981, 37 y.o.   MRN: [id number]  HPI Marcile Madelyn Brunner, a 37 year old female with a history of morbid obesity presents complaining of weight gain, abnormal hair growth and amenorrhea. Patient says that she was previously evaluated at the [facility] for abnormal menstrual cycles. She was advised to follow up with PCP due to other symptoms involved. Patient had a work-up including a pelvic and transvaginal ultrasound, which was unremarkable.  Patient has been having weight gain despite low fat diet and exercise. Also, she endorses abnormal menses and hair growth to chest and chin. Body mass index is 44.43 kg/m. Patient states that weight has increased by 30 pounds over the past 6 months.  Patient was started on a trial of metformin 500 mg daily and has been taking medication consistently.  Last menstrual cycle was on 03/20/2018  She has a family history of obesity and type 2 diabetes mellitus. She denies intolerance to heat/cold, constipation, hair loss to scalp, or depression.   Past Medical History:  Diagnosis Date  . Obesity (BMI 30-39.9)    Social History   Socioeconomic History  . Marital status: Single    Spouse name: Not on file  . Number of children: Not on file  . Years of education: Not on file  . Highest education level: Not on file  Occupational History  . Not on file  Social Needs  . Financial resource strain: Not on file  . Food insecurity:    Worry: Not on file    Inability: Not on file  . Transportation needs:    Medical: Not on file    Non-medical: Not on file  Tobacco Use  . Smoking status: Never Smoker  . Smokeless tobacco: Never Used  Substance and Sexual Activity  . Alcohol use: No  . Drug use: No  . Sexual activity: Yes  Lifestyle  . Physical activity:    Days per week: Not on file    Minutes per session: Not on file  . Stress: Not on file  Relationships   . Social connections:    Talks on phone: Not on file    Gets together: Not on file    Attends religious service: Not on file    Active member of club or organization: Not on file    Attends meetings of clubs or organizations: Not on file    Relationship status: Not on file  . Intimate partner violence:    Fear of current or ex partner: Not on file    Emotionally abused: Not on file    Physically abused: Not on file    Forced sexual activity: Not on file  Other Topics Concern  . Not on file  Social History Narrative   ** Merged History Encounter **       Review of Systems  Constitutional: Positive for unexpected weight change (weight gain).  HENT: Negative.   Eyes: Negative.   Respiratory: Negative.   Cardiovascular: Negative.  Negative for chest pain, palpitations and leg swelling.  Endocrine: Negative.   Genitourinary: Negative.   Musculoskeletal: Negative.   Skin: Negative.   Allergic/Immunologic: Negative.   Neurological: Negative.   Hematological: Negative.   Psychiatric/Behavioral: Negative.        Objective:   Physical Exam  Constitutional: She appears well-developed and well-nourished.  obesity  Eyes: Pupils are equal, round, and reactive to light.  Neck: Normal range of  motion.  Cardiovascular: Normal rate and regular rhythm.  Pulmonary/Chest: Effort normal and breath sounds normal.  Abdominal: Soft. Bowel sounds are normal.  Abdominal obesity   Musculoskeletal: Normal range of motion.  Skin: Skin is warm and dry.        BP 117/76 (BP Location: Right Arm, Patient Position: Sitting, Cuff Size: Large)   Pulse 87   Temp 99 F (37.2 C) (Oral)   Resp 16   Ht 5\' 5"  (1.651 m)   Wt 267 lb (121.1 kg)   LMP 03/20/2018   SpO2 98%   BMI 44.43 kg/m  Assessment & Plan:  1. PCOS (polycystic ovarian syndrome) We will continue metformin 500 mg daily work records for polycystic ovarian syndrome.  Recommend a low-fat, carbohydrate modify diet divided over 5-6  small meals throughout the day. Patient also reminded to decrease portions and to refrain from eating after 8 PM. Also discussed the importance of adhering to routine exercise and carbohydrate modify diet in order to achieve positive outcomes.  2. Obesity, unspecified classification, unspecified obesity type, unspecified whether serious comorbidity present  Body mass index is 44.43 kg/m. Filed Weights   04/23/18 1021  Weight: 267 lb (121.1 kg)   Patient advised to decrease weight by 10% over the next 3 months.  3. Hirsutism I suspect that abnormal hair growth is due to polycystic ovarian syndrome.   RTC: 6 months for PCOS and metabolic syndrome    Nolon NationsLachina Moore Elena Cothern  MSN, FNP-C [facility] [address]  [place], [place] [id number] [phone]

## 2018-05-08 ENCOUNTER — Ambulatory Visit (INDEPENDENT_AMBULATORY_CARE_PROVIDER_SITE_OTHER): Payer: Self-pay | Admitting: Obstetrics & Gynecology

## 2018-05-08 ENCOUNTER — Encounter: Payer: Self-pay | Admitting: Obstetrics & Gynecology

## 2018-05-08 ENCOUNTER — Other Ambulatory Visit: Payer: Self-pay

## 2018-05-08 DIAGNOSIS — N926 Irregular menstruation, unspecified: Secondary | ICD-10-CM

## 2018-05-08 MED ORDER — MEDROXYPROGESTERONE ACETATE 10 MG PO TABS: 10.0000 mg | ORAL_TABLET | Freq: Every day | ORAL | 12 refills | Status: DC

## 2018-05-08 NOTE — Progress Notes (Signed)
   Subjective:    Patient ID: Ashley Grant, female    DOB: 10-Apr-1981, 37 y.o.   MRN: [id number]  HPI 37 yo single 35P1 (514 yo daughter) here today with the issue of irregular periods, skips periods. She has skipped an entire year before. She was prescribed OCPs by the health dept and has taken them for 2 days but plans to stop them because she wants a pregnancy.    Review of Systems She had a pap at the health dept 2 months ago that was normal.    Objective:   Physical Exam Breathing, conversing, and ambulating normally Well nourished, well hydrated Latina, no apparent distress  Abd- benign    Assessment & Plan:  Desire for pregnancy- rec MVI daily after she stops OCPs I have explained that we do NOT do infertillity work here Rec continue metformin I will prescribe provera 10 mg for 10 days per month Check LH/FSH Rec MVI daily Rec weight loss

## 2018-05-09 LAB — HEMOGLOBIN A1C
Est. average glucose Bld gHb Est-mCnc: 117 mg/dL
Hgb A1c MFr Bld: 5.7 % — ABNORMAL HIGH (ref 4.8–5.6)

## 2018-05-09 LAB — LUTEINIZING HORMONE: LH: 7.9 m[IU]/mL

## 2018-05-09 LAB — FOLLICLE STIMULATING HORMONE: FSH: 6.9 m[IU]/mL

## 2018-06-30 ENCOUNTER — Other Ambulatory Visit: Payer: Self-pay | Admitting: Family Medicine

## 2018-06-30 DIAGNOSIS — E282 Polycystic ovarian syndrome: Secondary | ICD-10-CM

## 2018-06-30 MED FILL — TRIAMCINOLONE 0.5% OINTMENT: 0.5 | 15 days supply | Qty: 30 | Fill #1

## 2018-06-30 MED FILL — ?METFORMIN HCL 500MG TABS: 500 | 30 days supply | Qty: 30 | Fill #0

## 2018-07-06 ENCOUNTER — Ambulatory Visit: Payer: No Typology Code available for payment source

## 2018-07-06 ENCOUNTER — Ambulatory Visit: Payer: Self-pay | Attending: Family Medicine

## 2018-08-14 ENCOUNTER — Encounter: Payer: Self-pay | Admitting: Family Medicine

## 2018-08-14 ENCOUNTER — Ambulatory Visit (INDEPENDENT_AMBULATORY_CARE_PROVIDER_SITE_OTHER): Payer: Self-pay | Admitting: Family Medicine

## 2018-08-14 VITALS — BP 114/82 | HR 91 | Temp 99.3°F | Resp 16 | Ht 65.0 in | Wt 261.0 lb

## 2018-08-14 DIAGNOSIS — R200 Anesthesia of skin: Secondary | ICD-10-CM

## 2018-08-14 DIAGNOSIS — R202 Paresthesia of skin: Secondary | ICD-10-CM

## 2018-08-14 DIAGNOSIS — E669 Obesity, unspecified: Secondary | ICD-10-CM

## 2018-08-14 DIAGNOSIS — G629 Polyneuropathy, unspecified: Secondary | ICD-10-CM

## 2018-08-14 DIAGNOSIS — Z0120 Encounter for dental examination and cleaning without abnormal findings: Secondary | ICD-10-CM

## 2018-08-14 DIAGNOSIS — E282 Polycystic ovarian syndrome: Secondary | ICD-10-CM

## 2018-08-14 LAB — POCT GLYCOSYLATED HEMOGLOBIN (HGB A1C): Hemoglobin A1C: 5.6 % (ref 4.0–5.6)

## 2018-08-14 MED ORDER — METFORMIN HCL 500 MG PO TABS: 500.0000 mg | ORAL_TABLET | Freq: Every day | ORAL | 3 refills | Status: DC

## 2018-08-14 MED ORDER — GABAPENTIN 100 MG PO CAPS: 100.0000 mg | ORAL_CAPSULE | Freq: Three times a day (TID) | ORAL | 3 refills | Status: DC

## 2018-08-14 MED FILL — metFORMIN HCL 500 MG TABS: 500 | 30 days supply | Qty: 30 | Fill #0

## 2018-08-14 MED FILL — GABAPENTIN 100 MG CAPSULE: 100 | 30 days supply | Qty: 90 | Fill #0

## 2018-08-14 NOTE — Progress Notes (Signed)
  [facility]   Progress Note: General Provider: Mike Gip, FNP  SUBJECTIVE:   Ashley Grant is a 37 y.o. female who  has a past medical history of Obesity (BMI 30-39.9).. Patient presents today for Numbness (in hands and feet and cold )  Patient states that she has been taking metformin for PCOS. Patient reports that she had a pelvic u/s 03/24/2018. Normal per report.  Patient states that she is unaware of a hx of anemia or autoimmune disorders.   Review of Systems  Constitutional: Negative.   HENT: Negative.   Eyes: Negative.   Respiratory: Negative.   Cardiovascular: Negative.   Gastrointestinal: Negative.   Genitourinary: Negative.   Musculoskeletal: Negative.   Skin: Negative.   Neurological: Positive for tingling.  Endo/Heme/Allergies:       Cold intolerance.   Psychiatric/Behavioral: Negative.      OBJECTIVE: BP 114/82 (BP Location: Right Arm, Patient Position: Sitting, Cuff Size: Large)   Pulse 91   Temp 99.3 F (37.4 C) (Oral)   Resp 16   Ht 5\' 5"  (1.651 m)   Wt 261 lb (118.4 kg)   LMP 07/21/2018   SpO2 99%   BMI 43.43 kg/m   Physical Exam  Constitutional: She is oriented to person, place, and time. She appears well-developed and well-nourished. No distress.  HENT:  Head: Normocephalic and atraumatic.  Eyes: Pupils are equal, round, and reactive to light. Conjunctivae and EOM are normal.  Neck: Normal range of motion.  Cardiovascular: Normal rate, regular rhythm, normal heart sounds and intact distal pulses.  Pulmonary/Chest: Effort normal and breath sounds normal. No respiratory distress.  Abdominal: Soft. Bowel sounds are normal. She exhibits no distension.  Musculoskeletal: Normal range of motion.  Neurological: She is alert and oriented to person, place, and time.  Skin: Skin is warm and dry.  Psychiatric: She has a normal mood and affect. Her behavior is normal. Thought content normal.    Nursing note and vitals reviewed.   ASSESSMENT/PLAN:   1. PCOS (polycystic ovarian syndrome) - HgB A1c - metFORMIN (GLUCOPHAGE) 500 MG tablet; Take 1 tablet (500 mg total) by mouth daily with breakfast.  Dispense: 30 tablet; Refill: 3  2. Obesity, unspecified classification, unspecified obesity type, unspecified whether serious comorbidity present The patient is asked to make an attempt to improve diet and exercise patterns to aid in medical management of this problem. - HgB A1c - Thyroid Panel With TSH   3. Neuropathy Will start neurontin.  - CBC With Differential - Comprehensive metabolic panel - Vitamin B12 - Vitamin D, 25-hydroxy - Ferritin - ANA,IFA RA Diag Pnl w/rflx Tit/Patn - Rheumatoid Arthritis Profile - Thyroid Panel With TSH - gabapentin (NEURONTIN) 100 MG capsule; Take 1 capsule (100 mg total) by mouth 3 (three) times daily.  Dispense: 90 capsule; Refill: 3  5. Visit for dental examination - Ambulatory referral to Dentistry         The patient was given clear instructions to go to ER or return to medical center if symptoms do not improve, worsen or new problems develop. The patient verbalized understanding and agreed with plan of care.   Ms. Freda Jackson. Riley Lam, FNP-BC [facility] [address] [place], [place] [address] [phone]     This note has been created with Dragon speech recognition software and smart phrase technology. Any transcriptional errors are unintentional.

## 2018-08-14 NOTE — Patient Instructions (Addendum)
I put in a referral to the dentist. I also suggest a daily multivitamin with iron. I sent neurontin to the pharmacy for your numbness.  Gabapentin capsules or tablets Qu es este medicamento? La GABAPENTINA se utiliza para controlar las crisis parciales en adultos con epilepsia. Este medicamento tambin se [place] para tratar ciertos tipos de dolores crnicos relacionados con los nervios. Este medicamento puede ser utilizado para otros usos; si tiene alguna pregunta consulte con su proveedor de atencin mdica o con su farmacutico. MARCAS COMUNES: Active-PAC with Gabapentin, Gabarone, Neurontin Qu le debo informar a mi profesional de la salud antes de tomar este medicamento? Necesita saber si usted presenta alguno de los [employer] o situaciones: -enfermedad renal -ideas suicidas, planes o si usted o alguien de su familia ha intentado un suicidio previo -una reaccin alrgica o inusual a la gabapentina, otros medicamentos, alimentos, colorantes o conservadores -si est embarazada o buscando quedar embarazada -si est amamantando a un beb Cmo debo utilizar este medicamento? Tome este medicamento por va oral con un vaso de agua. Siga las instrucciones de la etiqueta del [place]. Puede tomarlo con o sin alimentos. Si le genera malestar estomacal, tmelo con alimentos. Tome su medicamento a intervalos regulares. No lo tome con una frecuencia mayor a la indicada. No deje de tomarlo, excepto si as lo indica su mdico. Si le indican romper las tabletas de 600 u 800 mg por la mitad como parte de su dosis, la media tableta extra debe usarse para la prxima dosis. Si no ha usado la media tableta extra [employer] [address], debe desecharla. Su farmacutico le dar una Gu[place] del medicamento especial (MedGuide, nombre en ingls) con cada receta y en cada ocasin que la vuelva a surtir. Asegrese de leer esta informacin cada vez cuidadosamente. Hable con su pediatra para informarse acerca  del uso de este medicamento en nios. Puede requerir atencin especial. Sobredosis: Pngase en contacto inmediatamente con un centro toxicolgico o una sala de urgencia si usted cree que haya tomado demasiado medicamento. ATENCIN: [employer] es solo para usted. No comparta este medicamento con nadie. Qu sucede si me olvido de una dosis? Si olvida una dosis, tmela lo antes posible. Si es casi la hora de la prxima dosis, tome slo esa dosis. No tome dosis adicionales o dobles. Qu puede interactuar con este medicamento? No tome esta medicina con ninguno de los siguientes medicamentos: -otros productos de gabapentina Esta medicina tambin puede interactuar con los siguientes medicamentos: -alcohol -anticidos -antihistamnicos para Environmental consultant, tos y resfros -ciertos medicamentos para la ansiedad o para dormir -ciertos medicamentos para la depresin o trastornos psicticos -homatropina; hidrocodona -naproxeno -medicamentos narcticos(opiceos)para el dolor -fenotiazinas, tales como clorpromacina, mesoridazina, proclorperazina, tioridazina Puede ser que esta lista no menciona todas las posibles interacciones. Informe a su profesional de [employer] de [employer] productos a base de hierbas, medicamentos de [place] o suplementos nutritivos que est tomando. Si usted fuma, consume bebidas alcohlicas o si utiliza drogas ilegales, indqueselo tambin a su profesional de [employer]. Algunas sustancias pueden interactuar con su medicamento. A qu debo estar atento al usar [employer]? Visite a su mdico o a su profesional de la salud para chequear su evolucin peridicamente. Tal vez desee mantener un registro personal en su hogar de cmo siente que su enfermedad responde al tratamiento. Puede compartir esta informacin con su mdico o con su profesional de la salud durante cada [place]. Debe comunicarse con su mdico o con su profesional de la salud si sus  convulsiones empeoran o si  experimenta un nuevo tipo de convulsiones. No deje de tomar este medicamento ni ninguno de sus medicamentos para las convulsiones a menos que as lo indique su mdico o su profesional de Radiographer, therapeutic. Si suspende repentinamente su medicamento, el nmero de convulsiones o su gravedad puede aumentar. Use una pulsera o cadena de identificacin mdica si toma este medicamento para tratar sus convulsiones. Lleve consigo una tarjeta de identificacin que indique todos sus medicamentos. Puede experimentar somnolencia, mareos o visin borrosa. No conduzca ni utilice maquinaria, ni haga nada que Scientist, research (life sciences) en estado de alerta hasta que sepa cmo le afecta este medicamento. Para reducir los Kellogg, no se siente ni se ponga de pie con rapidez, especialmente si es un paciente de edad avanzada. El alcohol puede aumentar la somnolencia y [place]. Evite consumir bebidas alcohlicas. Se le podr secar la boca. Masticar chicle si azcar, chupar caramelos duros y beber agua en abundancia le ayudar a mantener la boca hmeda. El uso de este medicamento puede aumentar la posibilidad de Wilburt Finlay ideas o comportamiento suicida. Presta atencin a como usted responde al medicamento mientras est usndolo. Informe a su profesional de la salud inmediatamente de cualquier empeoramiento de humor o ideas de suicidio o de morir. Las mujeres que se encuentran Database administrator usan este medicamento pueden inscribirse en el registro del [employer] American Antiepileptic Drug Pregnancy Registry (Registro estadounidense de Psychiatrist de Medicamentos Antiepilpticos) llamando al telfono [phone]. Este registro recoge informacin acerca de la seguridad del uso de medicamentos antiepilpticos durante el Psychiatrist. Qu efectos secundarios puedo tener al [employer] este medicamento? Efectos secundarios que debe informar a su mdico o a Producer, television/film/video de la salud tan pronto como sea posible: -Therapist, art como erupcin  cutnea, picazn o urticarias, hinchazn de la cara, labios o lengua -empeoramiento de humor o ideas o actos de suicidio o de morir Efectos secundarios que, por lo general, no requieren atencin mdica (debe informarlos a su mdico o a su profesional de la salud si persisten o si son molestos): -estreimiento -dificultad para caminar o controlar los movimientos musculares -mareos -nuseas -habla arrastrando las palabras -cansancio -temblores -aumento de peso Puede ser que esta lista no menciona todos los posibles efectos secundarios. Comunquese a su mdico por asesoramiento mdico [employer]lett[employer]ard. Usted puede informar los efectos secundarios a la FDA por telfono al 1-800-FDA-1088. Dnde debo guardar mi medicina? Mantenga fuera del alcance de los nios. Este medicamento puede causar Neomia Dear sobredosis accidental y la muerte si lo toman otros adultos, nios o Neurosurgeon. Mezcle todo el medicamento sin usar con una sustancia como piedras sanitarias para gatos o posos (residuos) de caf. Luego deseche el [employer] International un recipiente cerrado, como una bolsa sellada o una lata de caf con tapa. No utilice este medicamento despus de la fecha de vencimiento. Guarde a [employer]I, entre 15 y 30 grados Celsius (59 y 24 grados Fahrenheit). ATENCIN: Este folleto es un resumen. Puede ser que no cubra toda la posible informacin. Si usted tiene preguntas acerca de esta medicina, consulte con su mdico, su farmacutico o su profesional de Radiographer, therapeutic.  2018 Elsevier/Gold Standard (2016-11-28 00:00:00)  Neuropathic Pain Neuropathic pain is pain caused by damage to the nerves that are responsible for certain sensations in your body (sensory nerves). The pain can be caused by damage to:  The sensory nerves that send signals to your spinal cord and brain (peripheral nervous system).  The sensory nerves in your brain or  spinal cord (central nervous system).  Neuropathic pain can make you  more sensitive to pain. What would be a minor sensation for most people may feel very painful if you have neuropathic pain. This is usually a long-term condition that can be difficult to treat. The type of pain can differ from person to person. It may start suddenly (acute), or it may develop slowly and last for a long time (chronic). Neuropathic pain may come and go as damaged nerves heal or may stay at the same level for years. It often causes emotional distress, loss of sleep, and a lower quality of life. What are the causes? The most common cause of damage to a sensory nerve is diabetes. Many other diseases and conditions can also cause neuropathic pain. Causes of neuropathic pain can be classified as:  Toxic. Many drugs and chemicals can cause toxic damage. The most common cause of toxic neuropathic pain is damage from drug treatment for cancer (chemotherapy).  Metabolic. This type of pain can happen when a disease causes imbalances that damage nerves. Diabetes is the most common of these diseases. Vitamin B deficiency caused by long-term alcohol abuse is another common cause.  Traumatic. Any injury that cuts, crushes, or stretches a nerve can cause damage and pain. A common example is feeling pain after losing an arm or leg (phantom limb pain).  Compression-related. If a sensory nerve gets trapped or compressed for a long period of time, the blood supply to the nerve can be cut off.  Vascular. Many blood vessel diseases can cause neuropathic pain by decreasing blood supply and oxygen to nerves.  Autoimmune. This type of pain results from diseases in which the body's defense system mistakenly attacks sensory nerves. Examples of autoimmune diseases that can cause neuropathic pain include lupus and multiple sclerosis.  Infectious. Many types of viral infections can damage sensory nerves and cause pain. Shingles infection is a common cause of this type of pain.  Inherited. Neuropathic pain can be  a symptom of many diseases that are passed down through families (genetic).  What are the signs or symptoms? The main symptom is pain. Neuropathic pain is often described as:  Burning.  Shock-like.  Stinging.  Hot or cold.  Itching.  How is this diagnosed? No single test can diagnose neuropathic pain. Your health care provider will do a physical exam and ask you about your pain. You may use a pain scale to describe how bad your pain is. You may also have tests to see if you have a high sensitivity to pain and to help find the cause and location of any sensory nerve damage. These tests may include:  Imaging studies, such as: ? X-rays. ? CT scan. ? MRI.  Nerve conduction studies to test how well nerve signals travel through your sensory nerves (electrodiagnostic testing).  Stimulating your sensory nerves through electrodes on your skin and measuring the response in your spinal cord and brain (somatosensory evoked potentials).  How is this treated? Treatment for neuropathic pain may change over time. You may need to try different treatment options or a combination of treatments. Some options include:  Over-the-counter pain relievers.  Prescription medicines. Some medicines used to treat other conditions may also help neuropathic pain. These include medicines to: ? Control seizures (anticonvulsants). ? Relieve depression (antidepressants).  Prescription-strength pain relievers (narcotics). These are usually used when other pain relievers do not help.  Transcutaneous nerve stimulation (TENS). This uses electrical currents to block painful nerve signals. The treatment  is painless.  Topical and local anesthetics. These are medicines that numb the nerves. They can be injected as a nerve block or applied to the skin.  Alternative treatments, such as: ? Acupuncture. ? Meditation. ? Massage. ? Physical therapy. ? Pain management programs. ? Counseling.  Follow these  instructions at home:  Learn as much as you can about your condition.  Take medicines only as directed by your health care provider.  Work closely with all your health care providers to find what works best for you.  Have a good support system at home.  Consider joining a chronic pain support group. Contact a health care provider if:  Your pain treatments are not helping.  You are having side effects from your medicines.  You are struggling with fatigue, mood changes, depression, or anxiety. This information is not intended to replace advice given to you by your health care provider. Make sure you discuss any questions you have with your health care provider. Document Released: 07/25/2004 Document Revised: 05/17/2016 Document Reviewed: 04/07/2014 Elsevier Interactive Patient Education  [employer].

## 2018-08-17 LAB — CBC WITH DIFFERENTIAL
Basophils Absolute: 0 10*3/uL (ref 0.0–0.2)
Basos: 0 %
EOS (ABSOLUTE): 0.1 10*3/uL (ref 0.0–0.4)
Eos: 1 %
Hematocrit: 44 % (ref 34.0–46.6)
Hemoglobin: 14.1 g/dL (ref 11.1–15.9)
Immature Grans (Abs): 0 10*3/uL (ref 0.0–0.1)
Immature Granulocytes: 0 %
Lymphocytes Absolute: 2.4 10*3/uL (ref 0.7–3.1)
Lymphs: 31 %
MCH: 27.6 pg (ref 26.6–33.0)
MCHC: 32 g/dL (ref 31.5–35.7)
MCV: 86 fL (ref 79–97)
Monocytes Absolute: 0.5 10*3/uL (ref 0.1–0.9)
Monocytes: 6 %
Neutrophils Absolute: 4.6 10*3/uL (ref 1.4–7.0)
Neutrophils: 62 %
RBC: 5.11 x10E6/uL (ref 3.77–5.28)
RDW: 12.5 % (ref 12.3–15.4)
WBC: 7.6 10*3/uL (ref 3.4–10.8)

## 2018-08-17 LAB — COMPREHENSIVE METABOLIC PANEL
ALT: 40 IU/L — ABNORMAL HIGH (ref 0–32)
AST: 31 IU/L (ref 0–40)
Albumin/Globulin Ratio: 1.8 (ref 1.2–2.2)
Albumin: 4.6 g/dL (ref 3.5–5.5)
Alkaline Phosphatase: 105 IU/L (ref 39–117)
BUN/Creatinine Ratio: 17 (ref 9–23)
BUN: 13 mg/dL (ref 6–20)
Bilirubin Total: 0.4 mg/dL (ref 0.0–1.2)
CO2: 23 mmol/L (ref 20–29)
Calcium: 9.5 mg/dL (ref 8.7–10.2)
Chloride: 102 mmol/L (ref 96–106)
Creatinine, Ser: 0.75 mg/dL (ref 0.57–1.00)
GFR calc Af Amer: 119 mL/min/{1.73_m2} (ref >59)
GFR calc non Af Amer: 103 mL/min/{1.73_m2} (ref >59)
Globulin, Total: 2.5 g/dL (ref 1.5–4.5)
Glucose: 88 mg/dL (ref 65–99)
Potassium: 4.4 mmol/L (ref 3.5–5.2)
Sodium: 139 mmol/L (ref 134–144)
Total Protein: 7.1 g/dL (ref 6.0–8.5)

## 2018-08-17 LAB — ANA,IFA RA DIAG PNL W/RFLX TIT/PATN
ANA Titer 1: NEGATIVE
Cyclic Citrullin Peptide Ab: 5 units (ref 0–19)
Rhuematoid fact SerPl-aCnc: 35.2 IU/mL — ABNORMAL HIGH (ref 0.0–13.9)

## 2018-08-17 LAB — THYROID PANEL WITH TSH
Free Thyroxine Index: 2.1 (ref 1.2–4.9)
T3 Uptake Ratio: 29 % (ref 24–39)
T4, Total: 7.3 ug/dL (ref 4.5–12.0)
TSH: 1.1 u[IU]/mL (ref 0.450–4.500)

## 2018-08-17 LAB — VITAMIN D 25 HYDROXY (VIT D DEFICIENCY, FRACTURES): Vit D, 25-Hydroxy: 29 ng/mL — ABNORMAL LOW (ref 30.0–100.0)

## 2018-08-17 LAB — FERRITIN: Ferritin: 69 ng/mL (ref 15–150)

## 2018-08-17 LAB — VITAMIN B12: Vitamin B-12: 819 pg/mL (ref 232–1245)

## 2018-08-25 ENCOUNTER — Ambulatory Visit: Payer: Self-pay | Attending: Family Medicine

## 2018-08-26 MED ORDER — VITAMIN D (ERGOCALCIFEROL) 1.25 MG (50000 UNIT) PO CAPS: 50000.0000 [IU] | ORAL_CAPSULE | ORAL | 0 refills | Status: AC

## 2018-08-26 NOTE — Addendum Note (Signed)
Addended by: Reatha Harps on: 08/26/2018 02:02 PM   Modules accepted: Orders

## 2018-10-23 ENCOUNTER — Ambulatory Visit: Payer: No Typology Code available for payment source | Admitting: Family Medicine

## 2019-05-16 ENCOUNTER — Other Ambulatory Visit: Payer: Self-pay

## 2019-05-16 ENCOUNTER — Encounter (HOSPITAL_COMMUNITY): Payer: Self-pay | Admitting: Emergency Medicine

## 2019-05-16 ENCOUNTER — Emergency Department (HOSPITAL_COMMUNITY)
Admission: EM | Admit: 2019-05-16 | Discharge: 2019-05-16 | Disposition: A | Discharge status: Home / Self Care | Payer: Self-pay | Attending: Emergency Medicine | Admitting: Emergency Medicine

## 2019-05-16 DIAGNOSIS — H60333 Swimmer's ear, bilateral: Secondary | ICD-10-CM | POA: Insufficient documentation

## 2019-05-16 DIAGNOSIS — Z7984 Long term (current) use of oral hypoglycemic drugs: Secondary | ICD-10-CM | POA: Insufficient documentation

## 2019-05-16 DIAGNOSIS — Z79899 Other long term (current) drug therapy: Secondary | ICD-10-CM | POA: Insufficient documentation

## 2019-05-16 MED ORDER — DEXAMETHASONE 0.1 % OP SUSP: 2.0000 [drp] | Freq: Two times a day (BID) | OPHTHALMIC | 0 refills | Status: AC

## 2019-05-16 MED ORDER — OFLOXACIN 0.3 % OT SOLN: 3.0000 [drp] | Freq: Two times a day (BID) | OTIC | 0 refills | Status: AC

## 2019-05-16 NOTE — ED Triage Notes (Signed)
Pt. Stated, ear pain started last wed. This weekend worse.

## 2019-05-16 NOTE — ED Provider Notes (Signed)
[facility] Provider Note   CSN: [id number] Arrival date & time: 05/16/19  [id number]    History   Chief Complaint Chief Complaint  Patient presents with  . Otalgia    HPI Ashley Grant is a 38 y.o. female.     38 year old female presents with complaint of bilateral ear pain for the past few days.  Patient has been swimming in the pool recently.  Denies drainage from her ears, states ears are tender to the touch.  No other complaints or concerns.     Past Medical History:  Diagnosis Date  . Obesity (BMI 30-39.9)     Patient Active Problem List   Diagnosis Date Noted  . PCOS (polycystic ovarian syndrome) 03/26/2018  . Hirsutism 03/26/2018  . Acute pain of left knee 05/19/2017  . Obesity 04/01/2016  . Environmental allergies 04/01/2016  . Ganglion cyst of wrist 04/01/2016  . Abnormal finding on urinalysis 04/01/2016  . S/P C-section 04/09/2014  . Gestational HTN 04/06/2014    Past Surgical History:  Procedure Laterality Date  . CESAREAN SECTION N/A 04/08/2014   Procedure: CESAREAN SECTION;  Surgeon: Adam PhenixJames G Arnold, MD;  Location: [facility];  Service: Obstetrics;  Laterality: N/A;     OB History    Gravida  1   Para  1   Term  1   Preterm      AB      Living  1     SAB      TAB      Ectopic      Multiple      Live Births  1            Home Medications    Prior to Admission medications   Medication Sig Start Date End Date Taking? Authorizing Provider  dexamethasone (DECADRON) 0.1 % ophthalmic suspension Place 2 drops into both ears 2 (two) times daily for 10 days. 05/16/19 05/26/19  Jeannie FendMurphy, Maniya Donovan A, PA-C  gabapentin (NEURONTIN) 100 MG capsule Take 1 capsule (100 mg total) by mouth 3 (three) times daily. 08/14/18   Mike Gipouglas, Andre, FNP  metFORMIN (GLUCOPHAGE) 500 MG tablet Take 1 tablet (500 mg total) by mouth daily with breakfast. 08/14/18   Mike Gipouglas, Andre, FNP  ofloxacin (FLOXIN) 0.3 % OTIC solution Place  3 drops into both ears 2 (two) times daily for 10 days. 05/16/19 05/26/19  Jeannie FendMurphy, Rahshawn Remo A, PA-C  triamcinolone ointment (KENALOG) 0.5 % Apply 1 application topically 2 (two) times daily. 03/20/18   Massie MaroonHollis, [name], FNP    Family History Family History  Problem Relation Age of Onset  . Diabetes Father   . Diabetes Maternal Grandfather   . Cancer Paternal Grandfather     Social History Social History   Tobacco Use  . Smoking status: Never Smoker  . Smokeless tobacco: Never Used  Substance Use Topics  . Alcohol use: No  . Drug use: No     Allergies   Patient has no known allergies.   Review of Systems Review of Systems  Constitutional: Negative for chills and fever.  HENT: Positive for ear pain. Negative for dental problem, ear discharge, facial swelling, hearing loss, trouble swallowing and voice change.   Gastrointestinal: Negative for vomiting.  Musculoskeletal: Negative for neck pain and neck stiffness.  Skin: Negative for rash and wound.  Allergic/Immunologic: Negative for immunocompromised state.  Neurological: Negative for headaches.  Hematological: Negative for adenopathy.  Psychiatric/Behavioral: Negative for confusion.  All other systems reviewed and are  negative.    Physical Exam Updated Vital Signs BP 117/87 (BP Location: Right Arm)   Pulse 93   Temp 98.1 F (36.7 C)   Resp 17   Ht 5\' 5"  (1.651 m)   Wt 113.4 kg   LMP 04/07/2019   SpO2 99%   BMI 41.60 kg/m   Physical Exam Vitals signs and nursing note reviewed.  Constitutional:      General: She is not in acute distress.    Appearance: She is well-developed. She is not diaphoretic.  HENT:     Head: Normocephalic and atraumatic.     Right Ear: Tympanic membrane normal. Swelling and tenderness present. No drainage. There is no impacted cerumen. Tympanic membrane is not perforated or erythematous.     Left Ear: Tympanic membrane normal. Swelling and tenderness present. No drainage. There is no  impacted cerumen. Tympanic membrane is not perforated or erythematous.  Neck:     Musculoskeletal: Neck supple. No muscular tenderness.  Pulmonary:     Effort: Pulmonary effort is normal.  Lymphadenopathy:     Cervical: No cervical adenopathy.  Neurological:     Mental Status: She is alert and oriented to person, place, and time.  Psychiatric:        Behavior: Behavior normal.      ED Treatments / Results  Labs (all labs ordered are listed, but only abnormal results are displayed) Labs Reviewed - No data to display  EKG None  Radiology No results found.  Procedures Procedures (including critical care time)  Medications Ordered in ED Medications - No data to display   Initial Impression / Assessment and Plan / ED Course  I have reviewed the triage vital signs and the nursing notes.  Pertinent labs & imaging results that were available during my care of the patient were reviewed by me and considered in my medical decision making (see chart for details).  Clinical Course as of May 15 849  Sun May 15, 5788  1278 38 year old female with complaint of bilateral ear pain x4 days.  Patient has been applying OTC drops without improvement.  On exam has bilateral otitis externa.  Patient given prescription for ciprofloxacin and dexamethasone drops.  Recommend recheck with PCP.  Also advised to keep her ears dry.   [LM]    Clinical Course User Index [LM] Tacy Learn, PA-C      Final Clinical Impressions(s) / ED Diagnoses   Final diagnoses:  Acute swimmer's ear of both sides    ED Discharge Orders         Ordered    dexamethasone (DECADRON) 0.1 % ophthalmic suspension  2 times daily     05/16/19 0824    ofloxacin (FLOXIN) 0.3 % OTIC solution  2 times daily     05/16/19 0824           Tacy Learn, PA-C 05/16/19 [place], [place], DO 05/16/19 [phone]

## 2019-07-21 ENCOUNTER — Encounter (HOSPITAL_COMMUNITY): Payer: Self-pay | Admitting: *Deleted

## 2019-07-21 ENCOUNTER — Encounter (HOSPITAL_COMMUNITY): Payer: Self-pay

## 2019-10-22 ENCOUNTER — Other Ambulatory Visit: Payer: Self-pay

## 2019-10-22 ENCOUNTER — Ambulatory Visit: Payer: Self-pay | Attending: Family Medicine | Admitting: Family Medicine

## 2019-10-22 VITALS — BP 129/93 | HR 104 | Temp 98.3°F | Ht 65.0 in | Wt 274.2 lb

## 2019-10-22 DIAGNOSIS — Z87898 Personal history of other specified conditions: Secondary | ICD-10-CM

## 2019-10-22 DIAGNOSIS — Z6841 Body Mass Index (BMI) 40.0 and over, adult: Secondary | ICD-10-CM

## 2019-10-22 DIAGNOSIS — R5383 Other fatigue: Secondary | ICD-10-CM

## 2019-10-22 DIAGNOSIS — E282 Polycystic ovarian syndrome: Secondary | ICD-10-CM

## 2019-10-22 DIAGNOSIS — N979 Female infertility, unspecified: Secondary | ICD-10-CM

## 2019-10-22 DIAGNOSIS — N912 Amenorrhea, unspecified: Secondary | ICD-10-CM

## 2019-10-22 MED ORDER — METFORMIN HCL 500 MG PO TABS: 500.0000 mg | ORAL_TABLET | Freq: Two times a day (BID) | ORAL | 3 refills | Status: DC

## 2019-10-22 MED FILL — metFORMIN HCL 500 MG TABS: 500 | 30 days supply | Qty: 60 | Fill #0

## 2019-10-22 NOTE — Patient Instructions (Signed)
Dieta para el sndrome del ovario poliqustico Diet for Polycystic Ovary Syndrome El sndrome del ovario poliqustico (SOP) es un trastorno de las sustancias qumicas (hormonas) que regulan el sistema reproductor de [place], incluidos los perodos mensuales (menstruacin). La afeccin causa el desequilibrio de hormonas importantes. El SOP puede causar lo siguiente:  Ausencia de la menstruacin o menstruaciones irregulares.  Desarrollo de [employer] ovarios.  Dificultad para quedar embarazada.  Inhibicin de la respuesta del organismo a los efectos de la insulina (resistencia a la insulina). La resistencia a la insulina puede derivar en obesidad y diabetes. Modificar la dieta puede ser de ayuda para mantener el SOP bajo control y a Scientist, clinical (histocompatibility and immunogenetics) su estado de [place]. Seguir una dieta equilibrada puede ser de ayuda para bajar de peso y Scientist, clinical (histocompatibility and immunogenetics) la forma en que el organismo hace uso de la insulina. Cules son algunos consejos para seguir este plan?  Siga una dieta equilibrada para comidas y colaciones. [place], almuerce y cene, y agregue una o dos colaciones diarias.  Incluya protenas en cada comida y colacin.  Elija los Counselling psychologist de los productos elaborados con [place].  Consuma diferentes alimentos.  Haga actividad fsica habitualmente como se lo haya indicado el mdico. Intente realizar 30 minutos de actividad fsica la mayor parte de los [address]  [address] de la [place].  Si tiene sobrepeso o es obesa: ? Preste atencin a la cantidad de Walt Disney ingiere. Reducir las caloras puede ayudarla a Publishing copy de [place]. ? Trabaje con su mdico o un especialista en alimentacin y nutricin (nutricionista) para determinar cuntas caloras necesita consumir por Futures trader. Qu alimentos puedo comer?  Frutas Incluya una variedad de colores y tipos. Todas las frutas son tiles para el SOP. Verduras Incluya una variedad de colores y tipos. Todas las verduras son tiles para el  SOP. Cereales Cereales integrales, como salvado. Panes, galletitas, cereales y pastas integrales. Avena sin endulzar, trigo de [place], [place], quinua y arroz integral. Tortillas hechas de maz o salvado. Carnes y otras protenas Protenas con bajo contenido de grasa ([place]), como pescado, pollo, frijoles, huevos y tofu. Lcteos Productos lcteos con bajo contenido de [place], como [place], palitos de queso y Dentist. Bebidas Bebidas con bajo contenido de [place] (the [place] of [place] deg [place]) o sin grasa, como [place], [place] de bajo [place], [place] sin azcar y pequeas cantidades de jugo 100% de frutas. Alios y condimentos [place]. Mostaza. Salsa barbacoa. Salsa de pepinillos. Mayonesa con bajo contenido de grasa o sin grasa. Grasas y aceites Aceite de oliva o de canola. Nueces y Stella. Es posible que los productos mencionados arriba no formen una lista completa de las bebidas y los alimentos recomendados. Comunquese con un nutricionista para conocer ms opciones. Qu alimentos no se recomiendan? Alimentos con alto contenido de caloras o Holiday representative. Comidas fritas. Dulces. Los productos elaborados con harina blanca refinada, entre ellos, pan blanco, pasteles, arroz blanco y pastas. Es posible que los productos que se enumeran ms arriba no sean una lista completa de los alimentos y las bebidas que se Theatre stage manager. Comunquese con un nutricionista para obtener ms informacin. Resumen  El SOP es un desequilibrio hormonal que afecta el sistema reproductor de la [place].  Puede ayudar a Retail banker bajo control al realizar actividad fsica de [place] regular y comer una dieta saludable y variada compuesta de verduras, frutas, cereales integrales, protenas de bajo contenido de grasa ([place]) y productos lcteos de bajo contenido de [place].  Cambiar lo que come Biomedical scientist en la que el  organismo [place] la [place], ayudar a las hormonas a Nature conservation officer normales y Public house manager a Curator. Esta  informacin no tiene Theme park manager el consejo del mdico. Asegrese de hacerle al mdico cualquier pregunta que tenga. Document Released: 02/19/2016 Document Revised: 02/06/2018 Document Reviewed: 10/14/2017 Elsevier Patient Education  2020 Elsevier Inc.  Dieta para el sndrome metablico Diet for Metabolic Syndrome El sndrome metablico es un trastorno que incluye tres o ms de las siguientes afecciones:  Obesidad abdominal, que se observa en una enorme circunferencia de la cintura.  Exceso de azcar (glucosa) en la sangre.  Presin arterial alta (hipertensin arterial).  Una cantidad ms elevada de lo normal de grasa (lpidos) en [employer].  Un nivel por debajo de lo normal de colesterol "bueno" (HDL). Llevar un estilo de Ander Purpura y seguir un plan de dieta saludable puede ayudarlo a manejar su afeccin. Estos cambios tambin pueden ayudar a IT sales professional de afecciones asociadas con el sndrome metablico, como diabetes, enfermedad cardaca y accidente cerebrovascular. Seguir una dieta cardiosaludable puede ayudarlo a disminuir el riesgo de enfermedad cardaca y mejorar el sndrome metablico. Neomia Dear dieta cardiosaludable incluye protenas magras, grasas saludables, frutos secos y muchas frutas y verduras. Junto con la actividad fsica regular, una dieta saludable:  Ayuda a mejorar la forma en que el organismo [place] la [place].  Ayuda a perder peso. Un objetivo comn de las personas con esta afeccin es bajar por lo menos entre el 7 y el 10% o ms del peso inicial. ? Por ejemplo, una persona que comienza con 300lb (136kg) y pierde 30lb (13.6kg) ha perdido el 10% de su peso inicial. Cules son algunos consejos para seguir este plan?  Use el ndice glucmico (IG) para planificar las comidas. El ndice le informa con qu rapidez un alimento elevar su nivel de azcar en la sangre. Elija alimentos con bajos valores de ndice glucmico. Esos alimentos elevan el azcar en la  sangre con ms lentitud.  Lleve un registro de la cantidad de caloras que ingiere. La ingesta de la cantidad correcta de caloras lo ayudar a Barista un peso saludable.  Considere la posibilidad de seguir Clinical cytogeneticist. Esta dieta incluye gran cantidad de verduras, carnes magras o pescado, cereales integrales, frutas, as como aceites y grasas saludables.  Use ms hierbas y especias para agregar sabor a sus comidas en lugar de usar manteca y sal (sodio).  Trate de comer pescado 1 o 2 veces por semana.  Elija una variedad de frutas, verduras y otros alimentos recomendados. Qu alimentos puedo comer?  Nils Pyle Bayas. Manzanas. Naranjas. Duraznos. Damascos. Ciruelas. Uvas. Mango. Papaya. Granada. Kiwi. Cerezas. Hoover Brunette Deatra James. Espinaca. Verduras de [employer]ott, que incluyen col rizada, [place], hojas de [place] y de [place]. Guisantes. Remolachas. Coliflor. Repollo. Brcoli. Zanahorias. Judas verdes. Tomates. Calabaza. Christella Noa. Pimientos. Cebollas. Pepinos. Coles de Bruselas. Batatas. ames. Frijoles. Lentejas. Granos Salvado molido. Pan integral de centeno. Pan, galletitas, tortillas, cereales y pastas integrales. Avena sin azcar. Trigo burgol. Cebada. Quinua. Arroz integral y arroz salvaje. Carnes y otras protenas Frutos de mar y Oceanographer. Carnes magras. Aves. Tofu. Frutos secos y semillas. Lcteos Productos lcteos sin grasa o con bajo contenido de [place], [place], yogur y Lorenzo. Grasas y Arts development officer. Aceite de canola o de oliva. Frutos secos y Civil engineer, contracting de frutos secos. Semillas. Tahini. Refrescos Taylor. Leche con bajo contenido de [place]. Productos alternativos de la [place], [address] de soja o de [place]. Jugo de frutas naturales. Alios y condimentos [place] con bajo contenido de  azcar o sin azcar, salsa barbacoa y [place]. Mostaza. Salsa de pepinillos. Hierbas. Es posible que los productos que se enumeran ms [place] no constituyan una lista completa de los  alimentos y las bebidas que puede tomar. Consulte a un nutricionista para obtener ms informacin. Qu alimentos no se recomiendan? Carnes y otras protenas Carne roja. Grasas y aceites Grasas saturadas, como [place], [place] y aceite de palma. Refrescos El alcohol. Bebidas azucaradas, como t helado y gaseosas. Otros alimentos Comidas fritas. Dulces. Alimentos salados. Es posible que los productos que se enumeran ms [place] no constituyan una lista completa de los alimentos y las bebidas que Nurse, adult. Consulte a un nutricionista para obtener ms informacin. Resumen  El sndrome metablico es una combinacin de afecciones como la obesidad abdominal, nivel alto de azcar en la sangre, presin arterial alta y niveles no saludables de colesterol. El sndrome metablico eleva el riesgo de desarrollar enfermedad cardaca, diabetes y accidente cerebrovascular.  Seguir una dieta cardiosaludable puede ayudarlo a disminuir el riesgo de enfermedad cardaca y mejorar el sndrome metablico. Esta dieta incluye protenas magras, grasas saludable, frutos secos y muchas frutas y verduras.  Junto con la actividad fsica regular, seguir una dieta saludable puede ayudar al organismo a usar mejor la insulina y [place] a usted a Administrator, Civil Service.  Un objetivo comn de las personas con sndrome metablico es perder por lo menos entre el 7 y el 10% del peso inicial. Esta informacin no tiene Marine scientist el consejo del mdico. Asegrese de hacerle al mdico cualquier pregunta que tenga. Document Released: 03/14/2015 Document Revised: 06/02/2018 Document Reviewed: 10/15/2017 Elsevier Patient Education  2020 [employer].

## 2019-10-22 NOTE — Progress Notes (Signed)
Subjective:  Patient ID: Ashley Grant, female    DOB: Feb 02, 1981  Age: 38 y.o. MRN: [id number]  CC: New Patient (Initial Visit)   HPI Ashley Grant, 38 year old female new to the practice, who presents secondary to the complaint of infertility/difficulty getting pregnant as well as a history of irregular menses with the past diagnosis of polycystic ovarian syndrome. She does not really understand the diagnosis. She was placed metformin to help with this. She is not on any type of birth control and has been trying for more than a year to get pregnant. She was on metformin but has stopped taking it after she ran out. She was not sure why the medication was prescribed. She would like to have a pap smear. She denies any history of abnormal pap smears and thinks that she had a pap smear last year. She denies any current abdominal or pelvic pain. No vaginal discharge. She does not think that she is currently pregnant. She denies any breast tenderness or nausea but has had issues with fatigue.  Past Medical History:  Diagnosis Date  . Obesity (BMI 30-39.9)     Past Surgical History:  Procedure Laterality Date  . CESAREAN SECTION N/A 04/08/2014   Procedure: CESAREAN SECTION;  Surgeon: Woodroe Mode, MD;  Location: [facility];  Service: Obstetrics;  Laterality: N/A;    Family History  Problem Relation Age of Onset  . Diabetes Father   . Diabetes Maternal Grandfather   . Cancer Paternal Grandfather     Social History   Tobacco Use  . Smoking status: Never Smoker  . Smokeless tobacco: Never Used  Substance Use Topics  . Alcohol use: No    ROS Review of Systems  Constitutional: Negative for chills and fever.  HENT: Negative for congestion, sore throat and trouble swallowing.   Eyes: Negative for photophobia and visual disturbance.  Respiratory: Negative for cough and shortness of breath.   Cardiovascular: Negative for chest pain and palpitations.  Gastrointestinal:  Negative for abdominal pain, blood in stool, constipation, diarrhea and nausea.  Endocrine: Negative for cold intolerance, heat intolerance, polydipsia, polyphagia and polyuria.  Genitourinary: Negative for dysuria and frequency.  Musculoskeletal: Negative for arthralgias and back pain.  Neurological: Negative for dizziness and headaches.  Hematological: Negative for adenopathy. Does not bruise/bleed easily.    Objective:   Today's Vitals: BP (!) 129/93   Pulse (!) 104   Temp 98.3 F (36.8 C) (Oral)   Ht 5\' 5"  (1.651 m)   Wt 274 lb 3.2 oz (124.4 kg)   SpO2 96%   BMI 45.63 kg/m   Physical Exam Vitals and nursing note reviewed.  Constitutional:      General: She is not in acute distress.    Appearance: She is obese.  Cardiovascular:     Rate and Rhythm: Normal rate and regular rhythm.  Pulmonary:     Effort: Pulmonary effort is normal.     Breath sounds: Normal breath sounds.  Abdominal:     Palpations: Abdomen is soft.     Tenderness: There is no abdominal tenderness. There is no right CVA tenderness, left CVA tenderness, guarding or rebound.     Comments: Truncal obesity  Musculoskeletal:        General: No tenderness or deformity.     Cervical back: Normal range of motion and neck supple. No tenderness.     Right lower leg: No edema.     Left lower leg: No edema.  Lymphadenopathy:  Cervical: No cervical adenopathy.  Skin:    General: Skin is warm and dry.  Neurological:     General: No focal deficit present.     Mental Status: She is alert and oriented to person, place, and time.  Psychiatric:        Mood and Affect: Mood normal.        Behavior: Behavior normal.     Assessment & Plan:  1. Fatigue, unspecified type Patient with complaint of fatigue as well as issues with infertility/absent menses.  She has also on review of chart had past history of prediabetes.  Vitamin D level in 2019 was slightly below normal at 29.0.  She will have basic metabolic panel,  thyroid panel with TSH, vitamin D level, CBC and hemoglobin A1c at today's visit in follow-up of her complaints of fatigue.  She will be notified of the results and if any further treatment is needed based on any lab abnormalities.  Healthy diet, regular exercise, weight loss and adequate sleep encouraged. - Basic Metabolic Panel - Thyroid Panel With TSH - Vitamin D, 25-hydroxy - CBC  2. 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  Patient will be referred to gynecology for further evaluation and treatment.  Will obtain urine pregnancy test to make sure that she is not currently pregnant as she has not had her menses in over 12 months. - Ambulatory referral to Gynecology - POCT urine pregnancy  3. History of prediabetes On review of chart, patient with prior hemoglobin A1c of 5.7 on 05/08/2018 consistent with prediabetes.  Patient will have repeat hemoglobin A1c at today's visit.  Also suggested patient restart use of metformin as this would help with insulin resistance and may improve PCOS and issues with infertility. - Hemoglobin A1C  4. Infertility, female; 5. PCOS (polycystic ovarian syndrome) Patient will be referred to gynecology in follow-up of her issues with infertility.  Patient likely has polycystic ovarian syndrome but will also check thyroid panel with TSH to look for possible thyroid disorder as a contributing factor.  We will also have patient restart the use of metformin which will help with PCOS and patient also with obesity and history of prediabetes. - Ambulatory referral to Gynecology - Thyroid Panel With TSH - metFORMIN (GLUCOPHAGE) 500 MG tablet; Take 1 tablet (500 mg total) by mouth 2 (two) times daily with a meal.  Dispense: 60 tablet; Refill: 3  6. Morbid obesity with BMI of 45.0-49.9, adult (HCC) Low-fat/low carbohydrate diet and low impact cardiovascular exercise recommended to help with weight loss which may help with issues  with infertility/PCOS and insulin resistance.  Outpatient Encounter Medications as of 10/22/2019  Medication Sig  . gabapentin (NEURONTIN) 100 MG capsule Take 1 capsule (100 mg total) by mouth 3 (three) times daily. (Patient not taking: Reported on 10/22/2019)  . metFORMIN (GLUCOPHAGE) 500 MG tablet Take 1 tablet (500 mg total) by mouth daily with breakfast. (Patient not taking: Reported on 10/22/2019)  . triamcinolone ointment (KENALOG) 0.5 % Apply 1 application topically 2 (two) times daily. (Patient not taking: Reported on 10/22/2019)   No facility-administered encounter medications on file as of 10/22/2019.   -patient was offered but declined influenza immunization   An After Visit Summary was printed and given to the patient.   Follow-up: Return in about 6 weeks (around 12/03/2019) for fatigue/absent menses.      Cain Saupe MD

## 2019-10-22 NOTE — Progress Notes (Signed)
Patient states that she has not had a period in a year.

## 2019-10-23 LAB — BASIC METABOLIC PANEL WITH GFR
BUN/Creatinine Ratio: 17 (ref 9–23)
BUN: 13 mg/dL (ref 6–20)
CO2: 25 mmol/L (ref 20–29)
Calcium: 9.2 mg/dL (ref 8.7–10.2)
Chloride: 103 mmol/L (ref 96–106)
Creatinine, Ser: 0.76 mg/dL (ref 0.57–1.00)
GFR calc Af Amer: 115 mL/min/1.73
GFR calc non Af Amer: 100 mL/min/1.73
Glucose: 83 mg/dL (ref 65–99)
Potassium: 3.9 mmol/L (ref 3.5–5.2)
Sodium: 142 mmol/L (ref 134–144)

## 2019-10-23 LAB — HEMOGLOBIN A1C
Est. average glucose Bld gHb Est-mCnc: 126 mg/dL
Hgb A1c MFr Bld: 6 % — ABNORMAL HIGH (ref 4.8–5.6)

## 2019-10-23 LAB — CBC
Hematocrit: 44.7 % (ref 34.0–46.6)
Hemoglobin: 14.9 g/dL (ref 11.1–15.9)
MCH: 28.8 pg (ref 26.6–33.0)
MCHC: 33.3 g/dL (ref 31.5–35.7)
MCV: 87 fL (ref 79–97)
Platelets: 332 x10E3/uL (ref 150–450)
RBC: 5.17 x10E6/uL (ref 3.77–5.28)
RDW: 12.6 % (ref 11.7–15.4)
WBC: 5.1 x10E3/uL (ref 3.4–10.8)

## 2019-10-23 LAB — THYROID PANEL WITH TSH
Free Thyroxine Index: 2.6 (ref 1.2–4.9)
T3 Uptake Ratio: 25 % (ref 24–39)
T4, Total: 10.4 ug/dL (ref 4.5–12.0)
TSH: 0.742 u[IU]/mL (ref 0.450–4.500)

## 2019-10-23 LAB — VITAMIN D 25 HYDROXY (VIT D DEFICIENCY, FRACTURES): Vit D, 25-Hydroxy: 26.8 ng/mL — ABNORMAL LOW (ref 30.0–100.0)

## 2019-10-25 ENCOUNTER — Encounter: Payer: Self-pay | Admitting: Family Medicine

## 2019-10-31 ENCOUNTER — Encounter: Payer: Self-pay | Admitting: Family Medicine

## 2019-11-01 ENCOUNTER — Telehealth: Payer: Self-pay | Admitting: Family Medicine

## 2019-11-01 ENCOUNTER — Other Ambulatory Visit: Payer: Self-pay | Admitting: Family Medicine

## 2019-11-01 ENCOUNTER — Telehealth: Payer: Self-pay

## 2019-11-01 DIAGNOSIS — E559 Vitamin D deficiency, unspecified: Secondary | ICD-10-CM

## 2019-11-01 MED ORDER — VITAMIN D (ERGOCALCIFEROL) 1.25 MG (50000 UNIT) PO CAPS: 50000.0000 [IU] | ORAL_CAPSULE | ORAL | 2 refills | Status: DC

## 2019-11-01 NOTE — Telephone Encounter (Signed)
Pt called to request her vitamin D be prescribed to [facility]. She is also confused about her last call with nurse, states she never received her pregnancy test which she wanted to have. Please follow up as soon as possible.

## 2019-11-01 NOTE — Telephone Encounter (Signed)
-----   Message from Trecia Rogers, [place] sent at 11/01/2019 12:10 PM EST ----- Regarding: FW: lab result Please document for PCP ----- Message ----- From: Antony Blackbird, MD Sent: 10/30/2019   9:26 PM EST To: Trecia Rogers, CMA, Debbra Riding, RMA Subject: lab result                                     Can you enter the results of the pregnancy test that patient had at her recent visit so that the patient can be notified.

## 2019-11-01 NOTE — Telephone Encounter (Signed)
Patient stated she is interpreted in getting the prescription for her vitamin D

## 2019-11-01 NOTE — Telephone Encounter (Signed)
There was not a urine pregnancy done that day for this patient.

## 2019-11-01 NOTE — Telephone Encounter (Signed)
Okay, you can have her come in to do one if she would like. I cancelled the order last night so that I could close her note

## 2019-11-01 NOTE — Telephone Encounter (Signed)
RX for Vitamin D will be sent to [facility] but please confirm with patient that she is not currently breast feeding as she should avoid the use of high dose Vitamin D while breastfeeding. Apparently there is no record of her pregnancy test being done at her last visit so if she wants to have a lab visit to do a urine pregnancy test then let me know so that I can enter an order

## 2019-11-01 NOTE — Progress Notes (Signed)
Patient ID: Ashley Grant, female   DOB: [birth year], 38 y.o.   MRN: [id number]   Patient with vitamin D level of 26.8 on recent blood work and patient was contacted by [facility] regarding results and she would like to be on prescription vitamin D replacement therapy.  CMA will notify patient that prescription will be sent to her pharmacy however if patient is currently breast-feeding, she should not take high-dose prescription vitamin D.

## 2019-11-02 ENCOUNTER — Ambulatory Visit: Payer: Self-pay | Admitting: Family Medicine

## 2019-11-02 MED FILL — VIT D2 1.25 MG (50,000 UNIT: 1.25 MG | 35 days supply | Qty: 5 | Fill #0

## 2019-11-02 NOTE — Progress Notes (Signed)
Vitamin D prescription has been sent to patient's pharmacy and it appears that the urine pregnancy test was not done at her visit. If she is interested in having the urine pregnancy test, please help her schedule a lab visit and let me know so that the order can be placed. Also, make patient aware that she should not take the Vitamin D prescription if she is currently breast feeding. If her GYN appointment is this week then she may want to get the pregnancy test at that appointment.

## 2019-11-10 ENCOUNTER — Ambulatory Visit: Payer: Self-pay | Attending: Family Medicine | Admitting: *Deleted

## 2019-11-10 ENCOUNTER — Other Ambulatory Visit: Payer: Self-pay

## 2019-11-10 ENCOUNTER — Other Ambulatory Visit: Payer: Self-pay | Admitting: Family Medicine

## 2019-11-10 DIAGNOSIS — E559 Vitamin D deficiency, unspecified: Secondary | ICD-10-CM

## 2019-11-10 DIAGNOSIS — N979 Female infertility, unspecified: Secondary | ICD-10-CM

## 2019-11-10 DIAGNOSIS — N912 Amenorrhea, unspecified: Secondary | ICD-10-CM

## 2019-11-10 LAB — POCT URINE PREGNANCY: Preg Test, Ur: NEGATIVE

## 2019-11-10 MED FILL — metFORMIN HCL 500 MG TABS: 500 | 30 days supply | Qty: 60 | Fill #0

## 2019-11-10 MED FILL — VIT D2 1.25 MG (50,000 UNIT: 1.25 MG | 35 days supply | Qty: 5 | Fill #0

## 2019-11-10 NOTE — Progress Notes (Signed)
Patient ID: Ashley Grant, female   DOB: 14-Apr-1981, 39 y.o.   MRN: [id number]   At her recent visit, patient was referred to OB/GYN due to issues with fertility however, patient will need referral to [facility] instead. Referral will be placed.

## 2019-11-10 NOTE — Progress Notes (Signed)
Patient was advised to come return to office per Dr. Chapman Fitch OV note 10/22/2019-   2. 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  Patient will be referred to gynecology for further evaluation and treatment.  Will obtain urine pregnancy test to make sure that she is not currently pregnant as she has not had her menses in over 12 months. - Ambulatory referral to Gynecology - POCT urine pregnancy  POCT urine pregnancy negative. Patient informed of results. Has question about not having menses in 6 months. Informed that she was referred to GYN to follow up for abnl menstrual cycle. The referral was denied. Stated that our office do not offer  Infertility services, patient will need to be referred to [facility] [phone] .    Will refer nurse visit to Dr. Chapman Fitch.

## 2019-11-17 ENCOUNTER — Telehealth: Payer: Self-pay | Admitting: Family Medicine

## 2019-11-17 NOTE — Telephone Encounter (Signed)
Patient called saying she was taking Metformin 500mg  and to take 1 tablet daily. Patient states that the new prescription states to take 1 tablet 2x daily. Patient wants to know if the 500mg  twice daily is a lot to be taking.  Please f/u

## 2019-11-17 NOTE — Telephone Encounter (Signed)
It is okay for patient to take the metformin twice per day or she can take 2 pills once per day after her evening meal and continue a low carbohydrate diet and exercise as tolerated to help with her blood sugars and schedule follow-up in 3-4 months but sooner if any problems or concerns or symptoms such as increased thirst, blurred vision or frequent urination which can indicate that her blood sugars may be elevated

## 2019-11-17 NOTE — Telephone Encounter (Signed)
Patient name and DOB verified. Interpreter assistance provided by [employer], [phone].  Informed of message per Dr. Jillyn Hidden. Patient verbalized understanding.

## 2019-11-17 NOTE — Telephone Encounter (Signed)
Medical Assistant used [employer] to contact patient.  Interpreter Name: Gentry Fitz Interpreter #: [id number] Patient started Vitamin D supplement on yesterday and DENIES breastfeeding at this time. Patient did complete a pregnancy test with RN and referral was placed to the correct gynecologist office. Patient was provided the location and phone number for the GYN office to follow up if no appointment is set within a week or two.

## 2019-11-17 NOTE — Telephone Encounter (Signed)
Assisted [place] with the patient request regarding Metformin.   Explained  that her A1C level increased to 6.0 from 5.7 and 5.5. Patient does not have the capability to monitor blood sugars. Explained to patient that the MD would provide more clarification.

## 2019-11-23 ENCOUNTER — Ambulatory Visit: Payer: Self-pay | Attending: Family Medicine

## 2019-11-23 ENCOUNTER — Other Ambulatory Visit: Payer: Self-pay

## 2019-12-03 ENCOUNTER — Encounter: Payer: Self-pay | Admitting: Family Medicine

## 2019-12-03 ENCOUNTER — Ambulatory Visit: Payer: Self-pay | Attending: Family Medicine | Admitting: Family Medicine

## 2019-12-03 ENCOUNTER — Other Ambulatory Visit: Payer: Self-pay

## 2019-12-03 VITALS — BP 119/83 | HR 88 | Ht 65.0 in | Wt 278.0 lb

## 2019-12-03 DIAGNOSIS — Z6841 Body Mass Index (BMI) 40.0 and over, adult: Secondary | ICD-10-CM

## 2019-12-03 DIAGNOSIS — N912 Amenorrhea, unspecified: Secondary | ICD-10-CM

## 2019-12-03 DIAGNOSIS — E559 Vitamin D deficiency, unspecified: Secondary | ICD-10-CM

## 2019-12-03 DIAGNOSIS — R5383 Other fatigue: Secondary | ICD-10-CM

## 2019-12-03 DIAGNOSIS — R7303 Prediabetes: Secondary | ICD-10-CM

## 2019-12-03 LAB — POCT GLYCOSYLATED HEMOGLOBIN (HGB A1C): HbA1c, POC (prediabetic range): 5.9 % (ref 5.7–6.4)

## 2019-12-03 NOTE — Patient Instructions (Addendum)
Informacin bsica sobre la diabetes Diabetes Basics  La diabetes (diabetes mellitus) es una enfermedad de larga duracin (crnica). Se produce cuando el cuerpo no utiliza Occupational hygienist (glucosa) que se libera de los alimentos despus de comer. La diabetes puede deberse a uno de [employer] o a ambos:  El pncreas no produce suficiente cantidad de una hormona llamada insulina.  El cuerpo no reacciona de forma normal a la insulina que produce. La insulina permite que ciertos azcares (glucosa) ingresen a las clulas del cuerpo. Esto le proporciona energa. Si tiene diabetes, los azcares no pueden ingresar a las clulas. Esto produce un aumento del nivel de Dispensing optician (hiperglucemia). Sigue estas instrucciones en tu casa: Cmo se trata la diabetes? Es posible que tenga que administrarse insulina u otros medicamentos para la diabetes todos los [place] para mantener el nivel de Location manager en la sangre equilibrado. Adminstrese los medicamentos para la diabetes todos los [place] se lo haya indicado el mdico. Haga una lista de los medicamentos para la diabetes aqu: Medicamentos para la diabetes  Nombre del medicamento: ______________________________ ? Cantidad (dosis): ________________ Mellody Drown (a.m./p.m.): _______________ Wilford Grist: ___________________________________  Micki Riley medicamento: ______________________________ ? Cantidad (dosis): ________________ Mellody Drown (a.m./p.m.): _______________ Wilford Grist: ___________________________________  Micki Riley medicamento: ______________________________ ? Cantidad (dosis): ________________ Mellody Drown (a.m./p.m.): _______________ Wilford Grist: ___________________________________ Si [place] insulina, aprender cmo aplicrsela con inyecciones. Es posible que deba ajustar la cantidad en funcin de los alimentos que coma. Haga una lista de los tipos de [place] aqu: Insulina  Tipo de insulina: ______________________________ ? Cantidad (dosis):  ________________ Mellody Drown (a.m./p.m.): _______________ Wilford Grist: ___________________________________  Suezanne Jacquet: ______________________________ ? Cantidad (dosis): ________________ Mellody Drown (a.m./p.m.): _______________ Wilford Grist: ___________________________________  Suezanne Jacquet: ______________________________ ? Cantidad (dosis): ________________ Mellody Drown (a.m./p.m.): _______________ Wilford Grist: ___________________________________  Suezanne Jacquet: ______________________________ ? Cantidad (dosis): ________________ Mellody Drown (a.m./p.m.): _______________ Wilford Grist: ___________________________________  Suezanne Jacquet: ______________________________ ? Cantidad (dosis): ________________ Mellody Drown (a.m./p.m.): _______________ Wilford Grist: ___________________________________ Cmo me controlo el nivel de azcar en la sangre?  Controle sus niveles de azcar en la sangre con un medidor de glucemia segn las indicaciones del mdico. El mdico fijar los objetivos del tratamiento para usted. Generalmente, los resultados de los niveles de azcar en la sangre deben ser los siguientes:  Antes de las comidas (preprandial): de 80 a 133m/dl (de 4,4 a 7,284ml/l).  Despus de las comidas (posprandial): por debajo de 18049ml (52m65ml).  Nivel de A1c: menos del 7%. Anote las veces que se controlar los niveles de azcar en la sangre: Controles de azcar en la sangre  Hora: _______________ NotaWilford Grist_________________________________  HoraMellody Drown_____________ Notas: ___________________________________  Hora: _______________ Notas: ___________________________________  Hora: _______________ Notas: ___________________________________  Hora: _______________ Notas: ___________________________________  Hora: _______________ Notas: ___________________________________  Qu dSander Nephewo saber acerca del nivel bajo de azcar en la sangre? Un nivel bajo de azcar en la sangre se denomina hipoglucemia. Este cuadro ocurre cuando el  nivel de azcar en la sangre es igual o menor que 70mg71m(3,9mmol51m. Entre los sntomas, se pueden incluir los siguientes:  Sentir: ? HambreMount Enterpriseeocupacin o nervios (ansiedad). ? Sudoracin y piel hIntel Corporationnfusin. ? Mareos. ? Somnolencia. ? Ganas de vomitar (nuseas).  Tener: ? Latidos cardacos acelerados. ? Dolor de cabezaNetherlandsmbios en la visin. ? Hormigueo y falta de sensibilidad (entumecimiento) alrededor de la boca, los labios o la lenguaHawkinsvillevimientos espasmdicos que no puede controlar (convulsiones).  Dificultades para hacer lo siguiente: ? Moverse (coordinacin). ? Dormir. ? Desmayos. ? Molestarse con facilidad (irritabilidad). Tratamiento del nivelCactus  bajo de azcar en la sangre Para tratar un nivel bajo de azcar en la sangre, ingiera un alimento o una bebida azucarada de inmediato. Si puede pensar con claridad y tragar de manera segura, siga la regla 15/15, que consiste en lo siguiente:  Consuma 15gramos de un hidrato de carbono de accin rpida (carbohidrato). Hable con su mdico acerca de cunto debera consumir.  Algunos hidratos de carbono de accin rpida son: ? Comprimidos de azcar (pastillas de glucosa). Consuma 3o 4pastillas de glucosa. ? De 6 a 8unidades de caramelos duros. ? De 4 a 6onzas (de 120 a 150ml) de jugo de frutas. ? De 4 a 6onzas (de 120 a 150ml) de refresco comn (no diettico). ? 1 cucharada (15ml) de miel o azcar.  Contrlese el nivel de azcar en la sangre 15minutos despus de ingerir el hidrato de carbono.  Si el nivel de azcar en la sangre todava es igual o menor que 70mg/dl (3,9mmol/l), ingiera nuevamente 15gramos de un hidrato de carbono.  Si el nivel de azcar en la sangre no supera los 70mg/dl (3,9mmol/l) despus de 3intentos, solicite ayuda de inmediato.  Ingiera una comida o una colacin en el transcurso de 1hora despus de que el nivel de azcar en la sangre se haya normalizado. Tratamiento del nivel  muy bajo de azcar en la sangre Si el nivel de azcar en la sangre es igual o menor que 54mg/dl (3mmol/l), significa que est muy bajo (hipoglucemia grave). Esto es una emergencia. No espere a ver si los sntomas desaparecen. Solicite atencin mdica de inmediato. Comunquese con el servicio de emergencias de su localidad (911 en los [place]). No conduzca por sus propios medios hasta el hospital. Preguntas para hacerle al mdico  Es necesario que me rena con un instructor en el cuidado de la diabetes?  Qu equipos necesitar para cuidarme en casa?  Qu medicamentos para la diabetes necesito? Cundo debo tomarlos?  Con qu frecuencia debo controlar mi nivel de azcar en la sangre?  A qu nmero puedo llamar si tengo preguntas?  Cundo es mi prxima cita con el mdico?  Dnde puedo encontrar un grupo de apoyo para las personas con diabetes? Dnde buscar ms informacin  American Diabetes Association (Asociacin Estadounidense de la Diabetes): www.diabetes.org  American Association of Diabetes Educators (Asociacin Estadounidense de Instructores para el Cuidado de la Diabetes): www.diabeteseducator.org/patient-resources Comunquese con un mdico si:  El nivel de azcar en la sangre es igual o mayor que 240mg/dl (13,3mmol/dl) durante 2das seguidos.  Ha estado enfermo o ha tenido fiebre durante 2das o ms y no mejora.  Tiene alguno de estos problemas durante ms de 6horas: ? No puede comer ni beber. ? Siente malestar estomacal (nuseas). ? Vomita. ? Presenta heces lquidas (diarrea). Solicite ayuda inmediatamente si:  El nivel de azcar en la sangre est por debajo de 54mg/dl (3mmol/l).  Se siente confundida.  Tiene dificultad para hacer lo siguiente: ? Pensar con claridad. ? La respiracin. Resumen  La diabetes (diabetes mellitus) es una enfermedad de larga duracin (crnica). Se produce cuando el cuerpo no utiliza correctamente el azcar (glucosa)  que se libera de los alimentos despus de la digestin.  Aplquese la insulina y tome los medicamentos para la diabetes como se lo hayan indicado.  Contrlese el nivel de azcar en la sangre todos los das, con la frecuencia que le hayan indicado.  Concurra a todas las visitas de seguimiento como se lo haya indicado el mdico. Esto es importante. Esta informacin no tiene como fin reemplazar   el consejo del mdico. Asegrese de hacerle al mdico cualquier pregunta que tenga. Document Revised: 12/23/2018 Document Reviewed: 03/06/2018 Elsevier Patient Education  [place].  Preventing Type 2 Diabetes Mellitus Type 2 diabetes (type 2 diabetes mellitus) is a long-term (chronic) disease that affects blood sugar (glucose) levels. Normally, a hormone called insulin allows glucose to enter cells in the body. The cells use glucose for energy. In type 2 diabetes, one or both of these problems may be present:  The body does not make enough insulin.  The body does not respond properly to insulin that it makes (insulin resistance). Insulin resistance or lack of insulin causes excess glucose to build up in the blood instead of going into cells. As a result, high blood glucose (hyperglycemia) develops, which can cause many complications. Being overweight or obese and having an inactive (sedentary) lifestyle can increase your risk for diabetes. Type 2 diabetes can be delayed or prevented by making certain nutrition and lifestyle changes. What nutrition changes can be made?   Eat healthy meals and snacks regularly. Keep a healthy snack with you for when you get hungry between meals, such as fruit or a handful of nuts.  Eat lean meats and proteins that are low in saturated fats, such as chicken, fish, egg whites, and beans. Avoid processed meats.  Eat plenty of fruits and vegetables and plenty of grains that have not been processed (whole grains). It is recommended that you eat: ? 1?2 cups of fruit  every day. ? 2?3 cups of vegetables every day. ? 6?8 oz of whole grains every day, such as oats, whole wheat, bulgur, brown rice, quinoa, and millet.  Eat low-fat dairy products, such as milk, yogurt, and cheese.  Eat foods that contain healthy fats, such as nuts, avocado, olive oil, and canola oil.  Drink water throughout the day. Avoid drinks that contain added sugar, such as soda or sweet tea.  Follow instructions from your health care provider about specific eating or drinking restrictions.  Control how much food you eat at a time (portion size). ? Check food labels to find out the serving sizes of foods. ? Use a kitchen scale to weigh amounts of foods.  Saute or steam food instead of frying it. Cook with water or broth instead of oils or butter.  Limit your intake of: ? Salt (sodium). Have no more than 1 tsp (2,400 mg) of sodium a day. If you have heart disease or high blood pressure, have less than ? tsp (1,500 mg) of sodium a day. ? Saturated fat. This is fat that is solid at room temperature, such as butter or fat on meat. What lifestyle changes can be made? Activity   Do moderate-intensity physical activity for at least 30 minutes on at least 5 days of the week, or as much as told by your health care provider.  Ask your health care provider what activities are safe for you. A mix of physical activities may be best, such as walking, swimming, cycling, and strength training.  Try to add physical activity into your day. For example: ? Park in spots that are farther away than usual, so that you walk more. For example, park in a far corner of the parking lot when you go to the office or the grocery store. ? Take a walk during your lunch break. ? Use stairs instead of elevators or escalators. Weight Loss  Lose weight as directed. Your health care provider can determine how much weight loss is  best for you and can help you lose weight safely.  If you are overweight or obese,  you may be instructed to lose at least 5?7 % of your body weight. Alcohol and Tobacco   Limit alcohol intake to no more than 1 drink a day for nonpregnant women and 2 drinks a day for men. One drink equals 12 oz of beer, 5 oz of wine, or 1 oz of hard liquor.  Do not use any tobacco products, such as cigarettes, chewing tobacco, and e-cigarettes. If you need help quitting, ask your health care provider. Work With Your Health Care Provider  Have your blood glucose tested regularly, as told by your health care provider.  Discuss your risk factors and how you can reduce your risk for diabetes.  Get screening tests as told by your health care provider. You may have screening tests regularly, especially if you have certain risk factors for type 2 diabetes.  Make an appointment with a diet and nutrition specialist (registered dietitian). A registered dietitian can help you make a healthy eating plan and can help you understand portion sizes and food labels. Why are these changes important?  It is possible to prevent or delay type 2 diabetes and related health problems by making lifestyle and nutrition changes.  It can be difficult to recognize signs of type 2 diabetes. The best way to avoid possible damage to your body is to take actions to prevent the disease before you develop symptoms. What can happen if changes are not made?  Your blood glucose levels may keep increasing. Having high blood glucose for a long time is dangerous. Too much glucose in your blood can damage your blood vessels, heart, kidneys, nerves, and eyes.  You may develop prediabetes or type 2 diabetes. Type 2 diabetes can lead to many chronic health problems and complications, such as: ? Heart disease. ? Stroke. ? Blindness. ? Kidney disease. ? Depression. ? Poor circulation in the feet and legs, which could lead to surgical removal (amputation) in severe cases. Where to find support  Ask your health care provider to  recommend a registered dietitian, diabetes educator, or weight loss program.  Look for local or online weight loss groups.  Join a gym, fitness club, or outdoor activity group, such as a walking club. Where to find more information To learn more about diabetes and diabetes prevention, visit:  American Diabetes Association (ADA): www.diabetes.[employer] of Diabetes and Digestive and Kidney Diseases: ToyArticles.ca To learn more about healthy eating, visit:  The U.S. Department of Agriculture Architect), Choose My Plate: http://yates.biz/  Office of Disease Prevention and Health Promotion (ODPHP), Dietary Guidelines: ListingMagazine.si Summary  You can reduce your risk for type 2 diabetes by increasing your physical activity, eating healthy foods, and losing weight as directed.  Talk with your health care provider about your risk for type 2 diabetes. Ask about any blood tests or screening tests that you need to have. This information is not intended to replace advice given to you by your health care provider. Make sure you discuss any questions you have with your health care provider. Document Revised: 02/19/2019 Document Reviewed: 12/19/2015 Elsevier Patient Education  2020 [employer].

## 2019-12-03 NOTE — Progress Notes (Signed)
Established Patient Office Visit  Subjective:  Patient ID: Ashley Grant, female    DOB: Apr 30, 1981  Age: 39 y.o. MRN: [id number]  CC: No chief complaint on file.  Due to language barrier, Stratus video interpretation system used at today's visit  HPI Ashley Grant, 39 year old Hispanic female, who is seen in follow-up of visit on 10/22/2019 due to complaint of fatigue.  She was found at that visit to have vitamin D deficiency as well as prediabetes with hemoglobin A1c of 6.0.  She also at that time had complaint of absent menses.        At today's visit, she reports that her fatigue is only minimally improved.  She has started the use of vitamin D but only for about 3-4 doses once per week.  She was not aware that she had additional refills on her medication.  She denies any increased thirst or urinary frequency related to her prediabetes.  She is taking Metformin and denies any stomach upset or nausea associated with use of this medication.  She would like to have a repeat of her hemoglobin A1c.         She has scheduled an appointment with an infertility specialist due to continued amenorrhea and she and her husband have attempted to get pregnant for more than a year without success.  She has had no use of contraceptives or contraceptive devices during that time.  She denies any current lower abdominal, pelvic or vaginal pain.  She still has not had onset of menses for over a year.  Past Medical History:  Diagnosis Date  . Obesity (BMI 30-39.9)     Past Surgical History:  Procedure Laterality Date  . CESAREAN SECTION N/A 04/08/2014   Procedure: CESAREAN SECTION;  Surgeon: Woodroe Mode, MD;  Location: [facility];  Service: Obstetrics;  Laterality: N/A;    Family History  Problem Relation Age of Onset  . Diabetes Father   . Diabetes Maternal Grandfather   . Cancer Paternal Grandfather     Social History   Socioeconomic History  . Marital status: Single    Spouse  name: Not on file  . Number of children: Not on file  . Years of education: Not on file  . Highest education level: Not on file  Occupational History  . Not on file  Tobacco Use  . Smoking status: Never Smoker  . Smokeless tobacco: Never Used  Substance and Sexual Activity  . Alcohol use: No  . Drug use: No  . Sexual activity: Yes  Other Topics Concern  . Not on file  Social History Narrative   ** Merged History Encounter **       Social Determinants of Health   Financial Resource Strain:   . Difficulty of Paying Living Expenses: Not on file  Food Insecurity:   . Worried About Charity fundraiser in the Last Year: Not on file  . Ran Out of Food in the Last Year: Not on file  Transportation Needs:   . Lack of Transportation (Medical): Not on file  . Lack of Transportation (Non-Medical): Not on file  Physical Activity:   . Days of Exercise per Week: Not on file  . Minutes of Exercise per Session: Not on file  Stress:   . Feeling of Stress : Not on file  Social Connections:   . Frequency of Communication with Friends and Family: Not on file  . Frequency of Social Gatherings with Friends and Family: Not on file  .  Attends Religious Services: Not on file  . Active Member of Clubs or Organizations: Not on file  . Attends Banker Meetings: Not on file  . Marital Status: Not on file  Intimate Partner Violence:   . Fear of Current or Ex-Partner: Not on file  . Emotionally Abused: Not on file  . Physically Abused: Not on file  . Sexually Abused: Not on file    Outpatient Medications Prior to Visit  Medication Sig Dispense Refill  . metFORMIN (GLUCOPHAGE) 500 MG tablet Take 1 tablet (500 mg total) by mouth 2 (two) times daily with a meal. 60 tablet 3  . Vitamin D, Ergocalciferol, (DRISDOL) 1.25 MG (50000 UT) CAPS capsule Take 1 capsule (50,000 Units total) by mouth every 7 (seven) days. 5 capsule 2  . gabapentin (NEURONTIN) 100 MG capsule Take 1 capsule (100 mg  total) by mouth 3 (three) times daily. (Patient not taking: Reported on 10/22/2019) 90 capsule 3  . triamcinolone ointment (KENALOG) 0.5 % Apply 1 application topically 2 (two) times daily. (Patient not taking: Reported on 10/22/2019) 30 g 1   No facility-administered medications prior to visit.    No Known Allergies  ROS Review of Systems  Constitutional: Positive for fatigue. Negative for chills and fever.  HENT: Negative for sore throat and trouble swallowing.   Eyes: Negative for photophobia and visual disturbance.  Respiratory: Negative for cough and shortness of breath.   Cardiovascular: Negative for chest pain, palpitations and leg swelling.  Gastrointestinal: Negative for abdominal pain, constipation, diarrhea and nausea.  Endocrine: Negative for cold intolerance, heat intolerance, polydipsia, polyphagia and polyuria.  Genitourinary: Positive for menstrual problem. Negative for dysuria and frequency.  Musculoskeletal: Negative for arthralgias and back pain.  Neurological: Negative for dizziness and headaches.  Hematological: Negative for adenopathy. Does not bruise/bleed easily.      Objective:    Physical Exam  Constitutional: She is oriented to person, place, and time. She appears well-developed and well-nourished.  Morbidly obese female in no acute distress  Neck: No JVD present. No thyromegaly present.  Cardiovascular: Normal rate and regular rhythm.  Pulmonary/Chest: Effort normal and breath sounds normal.  Abdominal: Soft. There is no abdominal tenderness. There is no rebound and no guarding.  Truncal obesity  Musculoskeletal:        General: No tenderness or edema.     Cervical back: Normal range of motion and neck supple.     Comments: No CVA tenderness  Lymphadenopathy:    She has no cervical adenopathy.  Neurological: She is alert and oriented to person, place, and time.  Psychiatric: She has a normal mood and affect. Her behavior is normal.  Nursing note  and vitals reviewed.   BP 119/83   Pulse 88   Ht 5\' 5"  (1.651 m)   Wt 278 lb (126.1 kg)   SpO2 96%   PF 119 L/min   BMI 46.26 kg/m  Wt Readings from Last 3 Encounters:  12/03/19 278 lb (126.1 kg)  10/22/19 274 lb 3.2 oz (124.4 kg)  05/16/19 250 lb (113.4 kg)     Health Maintenance Due  Topic Date Due  . PNEUMOCOCCAL POLYSACCHARIDE VACCINE AGE 62-64 HIGH RISK  10/01/1983  . URINE MICROALBUMIN  10/01/1991  . INFLUENZA VACCINE  06/12/2019     Lab Results  Component Value Date   TSH 0.742 10/22/2019   Lab Results  Component Value Date   WBC 5.1 10/22/2019   HGB 14.9 10/22/2019   HCT 44.7 10/22/2019  MCV 87 10/22/2019   PLT 332 10/22/2019   Lab Results  Component Value Date   NA 142 10/22/2019   K 3.9 10/22/2019   CO2 25 10/22/2019   GLUCOSE 83 10/22/2019   BUN 13 10/22/2019   CREATININE 0.76 10/22/2019   BILITOT 0.4 08/14/2018   ALKPHOS 105 08/14/2018   AST 31 08/14/2018   ALT 40 (H) 08/14/2018   PROT 7.1 08/14/2018   ALBUMIN 4.6 08/14/2018   CALCIUM 9.2 10/22/2019   No results found for: CHOL No results found for: HDL No results found for: LDLCALC No results found for: TRIG No results found for: CHOLHDL Lab Results  Component Value Date   HGBA1C 6.0 (H) 10/22/2019      Assessment & Plan:  1. Prediabetes Patient with hemoglobin A1c done in December which was elevated at 6.0.  Patient reports that she has been compliant with the use of Metformin since her last visit.  Patient had requested repeat hemoglobin A1c but discussed with the patient that this test averages her blood sugars over a 90-day.  And it would not yet be time to recheck this blood work however lab for hemoglobin A1c was not canceled and CMA inadvertently recheck patient's hemoglobin A1c which was slightly improved to 5.9.  She is encouraged to follow a low carbohydrate diet, remain compliant with Metformin and return in 3 to 4 months for repeat hemoglobin A1c. - HgB A1c  2. Vitamin D  deficiency She reports that she has started taking vitamin D once per week and has now taken the medication for about 4 weeks.  She has only had slight improvement in her level of fatigue.  3. Absent menses Patient has not had onset of menses in over a year.  She does have upcoming appointment with fertility specialist as she and her husband are trying to conceive.  She is encouraged to follow a low-fat diet, continue Metformin for possible PCOS and help with insulin resistance as she is also prediabetic.  She does have upcoming GYN appointment with fertility specialist.  Continue Metformin as well as efforts at weight loss through dietary changes and exercise.  4. Fatigue, unspecified type She complains of continued fatigue.  She was made aware that she does have additional refills on her vitamin D and she should take the medication for at least 12 weeks.  We will recheck vitamin D level in 4 to 6 months and she will be notified if additional vitamin D therapy is needed.  5. Morbid obesity with BMI of 45.0-49.9, adult (HCC) Low-carb/low-fat diet with regular exercise encouraged as patient has obesity is likely contributing to her prediabetes and issues with infertility/possible PCOS  6.  Language barrier Stratus video interpretation system used at today's visit to help with communication that might otherwise hinder effectiveness of healthcare and could potentially cause harm due to understanding of medical information.  An After Visit Summary was printed and given to the patient.  Follow-up: Return in about 3 months (around 03/02/2020) for prediabetes.    Cain Saupe, MD

## 2019-12-17 MED FILL — metFORMIN HCL 500 MG TABS: 500 | 30 days supply | Qty: 60 | Fill #1

## 2019-12-17 MED FILL — VIT D2 1.25 MG (50,000 UNIT: 1.25 MG | 35 days supply | Qty: 5 | Fill #1

## 2020-01-27 MED FILL — ?METFORMIN HCL 500MG TABLET: 500 | 30 days supply | Qty: 60 | Fill #2

## 2020-01-27 MED FILL — ?ERGOCALCIFEROL 50000 UNITC: 1.25 MG | 35 days supply | Qty: 5 | Fill #2

## 2020-02-21 MED FILL — metFORMIN HCL 1000 MG TABS: 1000 | 30 days supply | Qty: 60 | Fill #0

## 2020-02-21 MED FILL — MEDROXYPROGESTERONE 10 MG T: 10 | 10 days supply | Qty: 10 | Fill #0

## 2020-02-21 MED FILL — LETROZOLE 2.5 MG TABS: 2.5 | 5 days supply | Qty: 15 | Fill #0

## 2020-04-11 ENCOUNTER — Inpatient Hospital Stay (HOSPITAL_COMMUNITY)
Admission: AD | Admit: 2020-04-11 | Discharge: 2020-04-11 | Disposition: A | Discharge status: Home / Self Care | Payer: Self-pay | Attending: Family Medicine | Admitting: Family Medicine

## 2020-04-11 ENCOUNTER — Encounter (HOSPITAL_COMMUNITY): Payer: Self-pay | Admitting: Family Medicine

## 2020-04-11 ENCOUNTER — Ambulatory Visit: Payer: Self-pay | Attending: Family Medicine

## 2020-04-11 ENCOUNTER — Other Ambulatory Visit: Payer: Self-pay

## 2020-04-11 ENCOUNTER — Inpatient Hospital Stay (HOSPITAL_COMMUNITY): Payer: Self-pay

## 2020-04-11 ENCOUNTER — Telehealth (INDEPENDENT_AMBULATORY_CARE_PROVIDER_SITE_OTHER): Payer: Self-pay

## 2020-04-11 ENCOUNTER — Other Ambulatory Visit (INDEPENDENT_AMBULATORY_CARE_PROVIDER_SITE_OTHER): Payer: Self-pay | Admitting: Primary Care

## 2020-04-11 DIAGNOSIS — O26891 Other specified pregnancy related conditions, first trimester: Secondary | ICD-10-CM

## 2020-04-11 DIAGNOSIS — Z7984 Long term (current) use of oral hypoglycemic drugs: Secondary | ICD-10-CM | POA: Insufficient documentation

## 2020-04-11 DIAGNOSIS — O99891 Other specified diseases and conditions complicating pregnancy: Secondary | ICD-10-CM

## 2020-04-11 DIAGNOSIS — Z3A01 Less than 8 weeks gestation of pregnancy: Secondary | ICD-10-CM | POA: Insufficient documentation

## 2020-04-11 DIAGNOSIS — Z3200 Encounter for pregnancy test, result unknown: Secondary | ICD-10-CM

## 2020-04-11 DIAGNOSIS — R109 Unspecified abdominal pain: Secondary | ICD-10-CM

## 2020-04-11 DIAGNOSIS — E282 Polycystic ovarian syndrome: Secondary | ICD-10-CM | POA: Insufficient documentation

## 2020-04-11 DIAGNOSIS — O1201 Gestational edema, first trimester: Secondary | ICD-10-CM | POA: Insufficient documentation

## 2020-04-11 DIAGNOSIS — N898 Other specified noninflammatory disorders of vagina: Secondary | ICD-10-CM | POA: Insufficient documentation

## 2020-04-11 DIAGNOSIS — Z79899 Other long term (current) drug therapy: Secondary | ICD-10-CM | POA: Insufficient documentation

## 2020-04-11 DIAGNOSIS — E669 Obesity, unspecified: Secondary | ICD-10-CM | POA: Insufficient documentation

## 2020-04-11 DIAGNOSIS — O209 Hemorrhage in early pregnancy, unspecified: Secondary | ICD-10-CM | POA: Insufficient documentation

## 2020-04-11 DIAGNOSIS — O3680X0 Pregnancy with inconclusive fetal viability, not applicable or unspecified: Secondary | ICD-10-CM

## 2020-04-11 DIAGNOSIS — O4690 Antepartum hemorrhage, unspecified, unspecified trimester: Secondary | ICD-10-CM

## 2020-04-11 DIAGNOSIS — Z3201 Encounter for pregnancy test, result positive: Secondary | ICD-10-CM

## 2020-04-11 HISTORY — DX: Polycystic ovarian syndrome: E28.2

## 2020-04-11 LAB — URINALYSIS, ROUTINE W REFLEX MICROSCOPIC
Bilirubin Urine: NEGATIVE
Glucose, UA: NEGATIVE mg/dL
Ketones, ur: NEGATIVE mg/dL
Leukocytes,Ua: NEGATIVE
Nitrite: NEGATIVE
Protein, ur: NEGATIVE mg/dL
Specific Gravity, Urine: 1.018 (ref 1.005–1.030)
pH: 5 (ref 5.0–8.0)

## 2020-04-11 LAB — CBC
HCT: 40.2 % (ref 36.0–46.0)
Hemoglobin: 12.8 g/dL (ref 12.0–15.0)
MCH: 28.6 pg (ref 26.0–34.0)
MCHC: 31.8 g/dL (ref 30.0–36.0)
MCV: 89.7 fL (ref 80.0–100.0)
Platelets: 400 10*3/uL (ref 150–400)
RBC: 4.48 MIL/uL (ref 3.87–5.11)
RDW: 12.9 % (ref 11.5–15.5)
WBC: 9.7 10*3/uL (ref 4.0–10.5)
nRBC: 0 % (ref 0.0–0.2)

## 2020-04-11 LAB — WET PREP, GENITAL
Clue Cells Wet Prep HPF POC: NONE SEEN
Sperm: NONE SEEN
Trich, Wet Prep: NONE SEEN
Yeast Wet Prep HPF POC: NONE SEEN

## 2020-04-11 LAB — HCG, QUANTITATIVE, PREGNANCY: hCG, Beta Chain, Quant, S: 156 m[IU]/mL — ABNORMAL HIGH (ref <5)

## 2020-04-11 NOTE — Discharge Instructions (Signed)
Anlisis de gonadotropina corinica humana Human Chorionic Gonadotropin Test Por qu me debo realizar esta prueba? La prueba de gonadotropina corinica humana [facility]) se realiza para determinar si est embarazada. Tambin puede usarse para lo siguiente:  Counsellor.  Determinar si ha perdido Chartered loss adjuster (aborto espontneo) o est en riesgo de uno. Qu se analiza? Esta prueba verifica el nivel de la hormona gonadotropina corinica humana [facility]) en la sangre. Esta hormona es producida durante el embarazo por las clulas que forman la placenta. La placenta es el rgano que crece dentro del vientre (tero) para nutrir a un beb en desarrollo. Cuando est embarazada, la [facility] puede detectarse en la sangre o la orina 7 a 8 das antes de la falta del perodo menstrual y contina aumentando durante las primeras 8 a 10 semanas del Psychiatrist. La presencia de [facility] en la sangre puede medirse con varios tipos de [place] [employer]. Es posible que le realicen lo siguiente:  Anlisis de [place]. ? Debido a que esta hormona se elimina del cuerpo a travs de los riones, es posible que le hagan un anlisis de orina para saber si est embarazada. Una prueba de embarazo casera detecta la presencia de [facility] en la orina. ? Health Net solo [employer] la presencia de [facility] en la orina. No establece en qu cantidad.  Un anlisis cualitativo de [place]. ? Puede realizarse este tipo de [place] de sangre para saber si est embarazada. ? Este anlisis de [place] solo muestra la presencia de [facility] en la [place]. No establece en qu cantidad.  Un anlisis cuantitativo de [place]. ? Este tipo de [employer] de sangre mide la cantidad de [facility] en la sangre. ? Tambin pueden hacerle este estudio para:  Diagnosticar un Database administrator.  Verificar si ha sufrido un aborto espontneo.  Determinar si est en riesgo de un aborto espontneo. Qu tipo de [place] se toma?     Hay dos tipos de muestras que se pueden  obtener para Landscape architect hormona [facility]  Sangre. Por lo general, para extraerla, se introduce una aguja en un vaso sanguneo.  Orina. Generalmente se obtiene al [employer] un recipiente para muestras libre de grmenes (estril). Es mejor obtener la [employer] la primera vez que orine en la [place]. Cmo debo prepararme para esta prueba? No se requiere ninguna preparacin para este anlisis de [place].  Para el anlisis de orina:  Informe a su mdico acerca de lo siguiente: ? Todos los [employer] est tomando, incluidas vitaminas, hierbas, cremas y [address] de [address]. ? La presencia de [employer]. Esto puede interferir con el resultado.  No beba demasiado lquido. Beba como lo hara normalmente o como se lo haya indicado el mdico. Cmo se informan los [place]? Segn el tipo de prueba que se Freight forwarder, los resultados pueden informarse [place]. Su mdico comparar sus resultados con los rangos normales que se establecieron luego de Education officer, environmental la prueba a un grupo grande de personas (rangos de referencia). Los rangos de referencia pueden variar entre laboratorios y hospitales. Para esta prueba, los rangos de referencia comunes que muestran la ausencia de [place] son:  Barnett Abu cuantitativos de [facility] en la sangre: menos de 5 UI/l. Otros resultados se informarn como positivos o negativos. Para esta prueba, los [address] (es decir, la ausencia de [place]) son:  Ronney Asters para [facility] en el anlisis de [place].  Negativo para [facility] en el anlisis cualitativo de [place]. Qu significan los resultados? Anlisis cualitativo de [place] y de [place]  Un resultado negativo podra significar: ? Que no est embarazada. ? Que la prueba se realiz demasiado temprano en el embarazo para detectar la presencia de [facility] en la sangre o la orina. Si tiene otros signos de Psychiatrist, se repetir la prueba.  Un resultado positivo significa: ? Que es muy probable que est embarazada. Su mdico  puede confirmar su embarazo con un estudio por imgenes (ecografa) del tero, si es necesario. Un anlisis cuantitativo de [place]. Los resultados del anlisis cuantitativo de [facility] en la sangre se interpretan de la siguiente manera:  Menos de 5 UI/l: Es muy probable que no est embarazada.  Ms de 25UI/l: Es muy probable que est embarazada.  Niveles de [facility] ms altos de lo previsto: ? Est embarazada de mellizos. ? Tiene crecimientos anormales en el tero.  Niveles de [facility] que aumentan ms lentamente de lo previsto: ? Sports coach ectpico (tambin llamado embarazo tubario).  [facility] en descenso: ? Puede estar sufriendo un aborto espontneo. Hable con su mdico sobre lo que significan sus [place]. Preguntas para hacerle al mdico Consulte a su mdico o pregunte en el departamento donde se realiza la prueba acerca de lo siguiente:  Cundo estarn disponibles mis resultados?  Cmo obtendr mis resultados?  Cules son mis opciones de tratamiento?  Qu otras pruebas necesito?  Cules son los prximos pasos que debo seguir? Resumen  La prueba de gonadotropina corinica humana se realiza para determinar si est embarazada.  Cuando est embarazada, la [facility] puede detectarse en la sangre o la orina 7 a 8 das antes de la falta del perodo menstrual y contina aumentando durante las primeras 8 a 10 semanas del Psychiatrist.  [place] de [facility] puede medirse con varios tipos de [employer]. Puede realizarse un anlisis de [place], un anlisis cualitativo de [facility] o un anlisis cuantitativo de [place].  Hable con su mdico sobre lo que significan sus [place]. Esta informacin no tiene Theme park manager el consejo del mdico. Asegrese de hacerle al mdico cualquier pregunta que tenga. Document Revised: 02/07/2018 Document Reviewed: 11/21/2017 Elsevier Patient Education  2020 [employer].

## 2020-04-11 NOTE — MAU Provider Note (Addendum)
Chief Complaint: Abdominal Pain and Vaginal Bleeding   First Provider Initiated Contact with Patient 04/11/20 2032     *Spanish interpreter at bedside for this encounter*  SUBJECTIVE HPI: Ashley Grant is a 39 y.o. G2P1001 at [redacted]w[redacted]d who presents to Maternity Admissions reporting abdominal pain & vaginal bleeding. Reports she had a 4 days bleeding episode last week that she thought was a period. Had a positive pregnancy test earlier today. No longer bleeding, just sees some orange on toilet paper when she wipes. Has had intermittent lower abdominal pain. Denies any other symptoms.   Location: abdomen Quality: cramping Severity: 5/10 on pain scale Duration: 1 week Timing: intermittent Modifying factors: none Associated signs and symptoms: vaginal bleeding  Past Medical History:  Diagnosis Date  . Obesity (BMI 30-39.9)   . PCOS (polycystic ovarian syndrome)    OB History  Gravida Para Term Preterm AB Living  2 1 1     1   SAB TAB Ectopic Multiple Live Births          1    # Outcome Date GA Lbr Len/2nd Weight Sex Delivery Anes PTL Lv  2 Current           1 Term 04/08/14 [redacted]w[redacted]d  3459 g F CS-LTranv EPI  LIV     Complications: Elevated BP without diagnosis of hypertension   Past Surgical History:  Procedure Laterality Date  . CESAREAN SECTION N/A 04/08/2014   Procedure: CESAREAN SECTION;  Surgeon: 04/10/2014, MD;  Location: [facility];  Service: Obstetrics;  Laterality: N/A;   Social History   Socioeconomic History  . Marital status: Single    Spouse name: Not on file  . Number of children: Not on file  . Years of education: Not on file  . Highest education level: Not on file  Occupational History  . Not on file  Tobacco Use  . Smoking status: Never Smoker  . Smokeless tobacco: Never Used  Substance and Sexual Activity  . Alcohol use: No  . Drug use: No  . Sexual activity: Yes  Other Topics Concern  . Not on file  Social History Narrative   ** Merged History  Encounter **       Social Determinants of Health   Financial Resource Strain:   . Difficulty of Paying Living Expenses:   Food Insecurity:   . Worried About Adam Phenix in the Last Year:   . Programme researcher, broadcasting/film/video in the Last Year:   Transportation Needs:   . Barista (Medical):   Freight forwarder Lack of Transportation (Non-Medical):   Physical Activity:   . Days of Exercise per Week:   . Minutes of Exercise per Session:   Stress:   . Feeling of Stress :   Social Connections:   . Frequency of Communication with Friends and Family:   . Frequency of Social Gatherings with Friends and Family:   . Attends Religious Services:   . Active Member of Clubs or Organizations:   . Attends Marland Kitchen Meetings:   Banker Marital Status:   Intimate Partner Violence:   . Fear of Current or Ex-Partner:   . Emotionally Abused:   Marland Kitchen Physically Abused:   . Sexually Abused:    Family History  Problem Relation Age of Onset  . Diabetes Father   . Diabetes Maternal Grandfather   . Cancer Paternal Grandfather    No current facility-administered medications on file prior to encounter.   Current Outpatient Medications on  File Prior to Encounter  Medication Sig Dispense Refill  . gabapentin (NEURONTIN) 100 MG capsule Take 1 capsule (100 mg total) by mouth 3 (three) times daily. 90 capsule 3  . metFORMIN (GLUCOPHAGE) 500 MG tablet Take 1 tablet (500 mg total) by mouth 2 (two) times daily with a meal. 60 tablet 3  . Prenatal Vit-Fe Fumarate-FA (PREPLUS) 27-1 MG TABS Take 1 tablet by mouth daily.    Marland Kitchen triamcinolone ointment (KENALOG) 0.5 % Apply 1 application topically 2 (two) times daily. 30 g 1  . Vitamin D, Ergocalciferol, (DRISDOL) 1.25 MG (50000 UT) CAPS capsule Take 1 capsule (50,000 Units total) by mouth every 7 (seven) days. 5 capsule 2   No Known Allergies  I have reviewed patient's Past Medical Hx, Surgical Hx, Family Hx, Social Hx, medications and allergies.   Review of Systems   Constitutional: Negative.   Gastrointestinal: Positive for abdominal pain. Negative for constipation, diarrhea, nausea and vomiting.  Genitourinary: Positive for vaginal bleeding and vaginal discharge.    OBJECTIVE Patient Vitals for the past 24 hrs:  BP Temp Temp src Pulse Resp SpO2 Height Weight  04/11/20 1913 (!) 126/101 98.6 F (37 C) Oral 93 18 100 % 5\' 5"  (1.651 m) 128.9 kg   Constitutional: Well-developed, well-nourished female in no acute distress.  Cardiovascular: normal rate & rhythm, no murmur Respiratory: normal rate and effort. Lung sounds clear throughout GI: Abd soft, non-tender, Pos BS x 4. No guarding or rebound tenderness MS: Extremities nontender, no edema, normal ROM Neurologic: Alert and oriented x 4.     LAB RESULTS Results for orders placed or performed during the hospital encounter of 04/11/20 (from the past 24 hour(s))  Urinalysis, Routine w reflex microscopic     Status: Abnormal   Collection Time: 04/11/20  7:40 PM  Result Value Ref Range   Color, Urine YELLOW YELLOW   APPearance HAZY (A) CLEAR   Specific Gravity, Urine 1.018 1.005 - 1.030   pH 5.0 5.0 - 8.0   Glucose, UA NEGATIVE NEGATIVE mg/dL   Hgb urine dipstick MODERATE (A) NEGATIVE   Bilirubin Urine NEGATIVE NEGATIVE   Ketones, ur NEGATIVE NEGATIVE mg/dL   Protein, ur NEGATIVE NEGATIVE mg/dL   Nitrite NEGATIVE NEGATIVE   Leukocytes,Ua NEGATIVE NEGATIVE   RBC / HPF 0-5 0 - 5 RBC/hpf   WBC, UA 0-5 0 - 5 WBC/hpf   Bacteria, UA RARE (A) NONE SEEN   Squamous Epithelial / LPF 0-5 0 - 5   Mucus PRESENT   CBC     Status: None   Collection Time: 04/11/20  8:39 PM  Result Value Ref Range   WBC 9.7 4.0 - 10.5 K/uL   RBC 4.48 3.87 - 5.11 MIL/uL   Hemoglobin 12.8 12.0 - 15.0 g/dL   HCT 06/11/20 85.4 - 62.7 %   MCV 89.7 80.0 - 100.0 fL   MCH 28.6 26.0 - 34.0 pg   MCHC 31.8 30.0 - 36.0 g/dL   RDW 03.5 00.9 - 38.1 %   Platelets 400 150 - 400 K/uL   nRBC 0.0 0.0 - 0.2 %  hCG, quantitative, pregnancy      Status: Abnormal   Collection Time: 04/11/20  8:39 PM  Result Value Ref Range   hCG, Beta Chain, Quant, S 156 (H) <5 mIU/mL  Wet prep, genital     Status: Abnormal   Collection Time: 04/11/20  8:57 PM  Result Value Ref Range   Yeast Wet Prep HPF POC NONE SEEN NONE SEEN  Trich, Wet Prep NONE SEEN NONE SEEN   Clue Cells Wet Prep HPF POC NONE SEEN NONE SEEN   WBC, Wet Prep HPF POC MANY (A) NONE SEEN   Sperm NONE SEEN     IMAGING US OB LESS THAN 14 WEEKS WITH OB TRANSVAGINAL  Result Date: 04/11/2020 CLINICAL DATA:  Bleeding, abdominal pain EXAM: OBSTETRIC <14 WK [place] AND TRANSVAGINAL OB US TECHNIQUE: Both transabdominal and transvaginal ultrasound examinations were performed for complete evaluation of the gestation as well as the maternal uterus, adnexal regions, and pelvic cul-de-sac. Transvaginal technique was performed to assess early pregnancy. COMPARISON:  None. FINDINGS: Intrauterine gestational sac: None Yolk sac:  Not visualized Embryo:  Not visualized Cardiac Activity: Not visualized Heart Rate:   Bpm MSD:   mm    w     d CRL:    mm    w    d                  [place] EDC: Subchorionic hemorrhage:  None visualized. Maternal uterus/adnexae: No adnexal mass or free fluid. IMPRESSION: No intrauterine pregnancy visualized. Differential considerations would include early intrauterine pregnancy too early to visualize, spontaneous abortion, or occult ectopic pregnancy. Recommend close clinical followup and serial quantitative beta HCGs and ultrasounds. Electronically Signed   By: Rolm Baptise M.D.   On: 04/11/2020 21:29    MAU COURSE Orders Placed This Encounter  Procedures  . Urinalysis, Routine w reflex microscopic   No orders of the defined types were placed in this encounter.   MDM +UPT UA, wet prep, GC/chlamydia, CBC, ABO/Rh, quant hCG, and [place] today to rule out ectopic pregnancy which can be life threatening.   RH positive  Care turned over to Davison,  NP 04/11/2020  9:04 PM  Reassessment (10:13 PM) -Results return as above. -Provider to bedside with interpreter-Viria -Discussed results and need for follow up. -Patient questions what low quant means and informed that more information needed before diagnosis can be made. -Patient instructed to return to MAU on Thursday June 3rd at 8pm. -Patient reports pedal edema and questions how to resolve.  Encouraged increased hydration and elevation.  -Patient without further questions or concerns. -Encouraged to call or return to MAU if symptoms worsen or with the onset of new symptoms. -Discharged to home in stable condition.  Maryann Conners MSN, CNM Advanced Practice Provider, [facility]:   04/11/20 1913 04/11/20 2016 04/11/20 2057 04/11/20 2227  BP: (!) 126/101 (!) 136/92 115/77   Pulse: 93 93 96   Resp: 18   18  Temp: 98.6 F (37 C)   98.8 F (37.1 C)  TempSrc: Oral   Oral  SpO2: 100%     Weight: 128.9 kg     Height: 5\' 5"  (1.651 m)

## 2020-04-11 NOTE — MAU Note (Signed)
Pt reports she just found out she was pregnant and started having some vaginal bleeding last week. Is currently having some spotting only when she wipes. Pt reports a small amount sharp pain in lower abdomen that comes and goes. Has not taken anything for pain. LMP: 04/03/2020.

## 2020-04-11 NOTE — Telephone Encounter (Signed)
[facility]ologist, engineering called wanting to know if patient could be added to our schedule for a HCG/ OV. PCP was advice and stated she would be able to put in orders for lab work with out having to make an appointment. Patient schedule a lab appointment at [facility].  This is just an [place]

## 2020-04-12 LAB — POCT PREGNANCY, URINE: Preg Test, Ur: POSITIVE — AB

## 2020-04-12 LAB — GC/CHLAMYDIA PROBE AMP (~~LOC~~) NOT AT ARMC
Chlamydia: NEGATIVE
Comment: NEGATIVE
Comment: NORMAL
Neisseria Gonorrhea: NEGATIVE

## 2020-04-12 LAB — BETA HCG QUANT (REF LAB): hCG Quant: 116 m[IU]/mL

## 2020-04-13 ENCOUNTER — Inpatient Hospital Stay (HOSPITAL_COMMUNITY)
Admission: AD | Admit: 2020-04-13 | Discharge: 2020-04-13 | Disposition: A | Discharge status: Home / Self Care | Payer: Self-pay | Attending: Obstetrics & Gynecology | Admitting: Obstetrics & Gynecology

## 2020-04-13 ENCOUNTER — Other Ambulatory Visit: Payer: Self-pay

## 2020-04-13 DIAGNOSIS — Z3A00 Weeks of gestation of pregnancy not specified: Secondary | ICD-10-CM

## 2020-04-13 DIAGNOSIS — Z79899 Other long term (current) drug therapy: Secondary | ICD-10-CM | POA: Insufficient documentation

## 2020-04-13 DIAGNOSIS — E282 Polycystic ovarian syndrome: Secondary | ICD-10-CM | POA: Insufficient documentation

## 2020-04-13 DIAGNOSIS — R030 Elevated blood-pressure reading, without diagnosis of hypertension: Secondary | ICD-10-CM | POA: Insufficient documentation

## 2020-04-13 DIAGNOSIS — O3680X0 Pregnancy with inconclusive fetal viability, not applicable or unspecified: Secondary | ICD-10-CM

## 2020-04-13 DIAGNOSIS — Z331 Pregnant state, incidental: Secondary | ICD-10-CM | POA: Insufficient documentation

## 2020-04-13 DIAGNOSIS — Z7984 Long term (current) use of oral hypoglycemic drugs: Secondary | ICD-10-CM | POA: Insufficient documentation

## 2020-04-13 DIAGNOSIS — E669 Obesity, unspecified: Secondary | ICD-10-CM | POA: Insufficient documentation

## 2020-04-13 LAB — HCG, QUANTITATIVE, PREGNANCY: hCG, Beta Chain, Quant, S: 246 m[IU]/mL — ABNORMAL HIGH (ref <5)

## 2020-04-13 NOTE — MAU Note (Addendum)
Here for repeat BHCG. Denies any pain or bleeding. Explained to pt she would have blood work done and wait in the lobby until results back. May be hour and half or so. Then will return to Triage where provider will discuss lab results and plan of care. Pt voices understanding

## 2020-04-13 NOTE — MAU Provider Note (Signed)
History     [id number]  Arrival date and time: 04/13/20 2003    No chief complaint on file.    HPI Ashley Grant is a 39 y.o. at [redacted]w[redacted]d by who presents for quant HCG check.  Seen on 04/11/2020 for abdominal pain and vaginal bleeding, had +UPT at home and presented to MAU No longer bleeding at time of visit, intermittent abdominal pain Beta hcg 156 at that time US showed no IUP or other acute findings Instructed today to return for follow up HCG  Today reports no vaginal bleeding, just some cervical mucous discharge No abdominal pain      OB History    Gravida  2   Para  1   Term  1   Preterm      AB      Living  1     SAB      TAB      Ectopic      Multiple      Live Births  1           Past Medical History:  Diagnosis Date   Obesity (BMI 30-39.9)    PCOS (polycystic ovarian syndrome)     Past Surgical History:  Procedure Laterality Date   CESAREAN SECTION N/A 04/08/2014   Procedure: CESAREAN SECTION;  Surgeon: Adam Phenix, MD;  Location: [facility];  Service: Obstetrics;  Laterality: N/A;    Family History  Problem Relation Age of Onset   Diabetes Father    Diabetes Maternal Grandfather    Cancer Paternal Grandfather     Social History   Socioeconomic History   Marital status: Single    Spouse name: Not on file   Number of children: Not on file   Years of education: Not on file   Highest education level: Not on file  Occupational History   Not on file  Tobacco Use   Smoking status: Never Smoker   Smokeless tobacco: Never Used  Substance and Sexual Activity   Alcohol use: No   Drug use: No   Sexual activity: Yes  Other Topics Concern   Not on file  Social History Narrative   ** Merged History Encounter **       Social Determinants of Health   Financial Resource Strain:    Difficulty of Paying Living Expenses:   Food Insecurity:    Worried About Programme researcher, broadcasting/film/video in the Last Year:    Garment/textile technologist in the Last Year:   Transportation Needs:    Freight forwarder (Medical):    Lack of Transportation (Non-Medical):   Physical Activity:    Days of Exercise per Week:    Minutes of Exercise per Session:   Stress:    Feeling of Stress :   Social Connections:    Frequency of Communication with Friends and Family:    Frequency of Social Gatherings with Friends and Family:    Attends Religious Services:    Active Member of Clubs or Organizations:    Attends Engineer, structural:    Marital Status:   Intimate Partner Violence:    Fear of Current or Ex-Partner:    Emotionally Abused:    Physically Abused:    Sexually Abused:     No Known Allergies  No current facility-administered medications on file prior to encounter.   Current Outpatient Medications on File Prior to Encounter  Medication Sig Dispense Refill   gabapentin (NEURONTIN) 100 MG capsule Take  1 capsule (100 mg total) by mouth 3 (three) times daily. 90 capsule 3   metFORMIN (GLUCOPHAGE) 500 MG tablet Take 1 tablet (500 mg total) by mouth 2 (two) times daily with a meal. 60 tablet 3   Prenatal Vit-Fe Fumarate-FA (PREPLUS) 27-1 MG TABS Take 1 tablet by mouth daily.     triamcinolone ointment (KENALOG) 0.5 % Apply 1 application topically 2 (two) times daily. 30 g 1   Vitamin D, Ergocalciferol, (DRISDOL) 1.25 MG (50000 UT) CAPS capsule Take 1 capsule (50,000 Units total) by mouth every 7 (seven) days. 5 capsule 2     ROS Pertinent positives and negative per HPI, all others reviewed and negative  Physical Exam   LMP 04/03/2020 (Exact Date)   Physical Exam  Vitals reviewed. Constitutional: She appears well-developed and well-nourished. No distress.  Eyes: No scleral icterus.  Respiratory: Effort normal. No respiratory distress.  Neurological: She is alert. Coordination normal.  Skin: Skin is warm and dry. She is not diaphoretic.  Psychiatric: She has a normal mood and affect.     Cervical Exam  not done  Bedside Ultrasound Not done  FHT N/a  Labs No results found for this or any previous visit (from the past 24 hour(s)).  Imaging No results found.  MAU Course  Procedures  Lab Orders     hCG, quantitative, pregnancy No orders of the defined types were placed in this encounter.  Imaging Orders  No imaging studies ordered today    MDM mild  Assessment and Plan  #Pregnancy of unknown location Patient returns after normal [place] without IUP and beta HCG of 156 on 04/11/2020. Today hcg is 246, >50% rise. Discussed this is consistent with normal pregnancy but still need to have follow up hcg and [place] to confirm IUP in 2 weeks. Will send message to [facility] to schedule. Patient will be given name, location, and phone number of clinic. If she is unable to schedule appt for whatever reason knows she needs to return to MAU for this follow up care. Reviewed warning signs of heavy vaginal bleeding or abdominal pain as reasons to return to MAU.  Notably patient also w elevated BP in MAU, likely cHTN. Not yet sure where she will go for prenatal care, discussed importance of addressing this during pregnancy and beyond.   D/c to home in stable condition.   Clarnce Flock

## 2020-04-13 NOTE — MAU Note (Addendum)
Dr Crissie Reese in Triage to see pt and discuss lab results and d/c plan. He discussed d/c plans with pt for f/u. Written d/c instructions given and understanding voiced.

## 2020-04-18 ENCOUNTER — Inpatient Hospital Stay (HOSPITAL_COMMUNITY)
Admission: AD | Admit: 2020-04-18 | Discharge: 2020-04-18 | Disposition: A | Discharge status: Home / Self Care | Payer: Self-pay | Attending: Family Medicine | Admitting: Family Medicine

## 2020-04-18 ENCOUNTER — Ambulatory Visit (INDEPENDENT_AMBULATORY_CARE_PROVIDER_SITE_OTHER): Payer: Self-pay | Admitting: *Deleted

## 2020-04-18 ENCOUNTER — Other Ambulatory Visit: Payer: Self-pay

## 2020-04-18 VITALS — BP 128/88 | HR 94 | Temp 98.0°F

## 2020-04-18 DIAGNOSIS — E669 Obesity, unspecified: Secondary | ICD-10-CM | POA: Insufficient documentation

## 2020-04-18 DIAGNOSIS — Z7984 Long term (current) use of oral hypoglycemic drugs: Secondary | ICD-10-CM | POA: Insufficient documentation

## 2020-04-18 DIAGNOSIS — O209 Hemorrhage in early pregnancy, unspecified: Secondary | ICD-10-CM | POA: Insufficient documentation

## 2020-04-18 DIAGNOSIS — O3680X0 Pregnancy with inconclusive fetal viability, not applicable or unspecified: Secondary | ICD-10-CM

## 2020-04-18 DIAGNOSIS — E282 Polycystic ovarian syndrome: Secondary | ICD-10-CM | POA: Insufficient documentation

## 2020-04-18 DIAGNOSIS — O99211 Obesity complicating pregnancy, first trimester: Secondary | ICD-10-CM | POA: Insufficient documentation

## 2020-04-18 DIAGNOSIS — Z3A01 Less than 8 weeks gestation of pregnancy: Secondary | ICD-10-CM | POA: Insufficient documentation

## 2020-04-18 DIAGNOSIS — O99281 Endocrine, nutritional and metabolic diseases complicating pregnancy, first trimester: Secondary | ICD-10-CM | POA: Insufficient documentation

## 2020-04-18 DIAGNOSIS — O0281 Inappropriate change in quantitative human chorionic gonadotropin (hCG) in early pregnancy: Secondary | ICD-10-CM

## 2020-04-18 LAB — CBC
HCT: 41.2 % (ref 36.0–46.0)
Hemoglobin: 13.2 g/dL (ref 12.0–15.0)
MCH: 28.9 pg (ref 26.0–34.0)
MCHC: 32 g/dL (ref 30.0–36.0)
MCV: 90.2 fL (ref 80.0–100.0)
Platelets: 411 10*3/uL — ABNORMAL HIGH (ref 150–400)
RBC: 4.57 MIL/uL (ref 3.87–5.11)
RDW: 13.2 % (ref 11.5–15.5)
WBC: 9.6 10*3/uL (ref 4.0–10.5)
nRBC: 0 % (ref 0.0–0.2)

## 2020-04-18 LAB — COMPREHENSIVE METABOLIC PANEL
ALT: 43 U/L (ref 0–44)
AST: 33 U/L (ref 15–41)
Albumin: 3.6 g/dL (ref 3.5–5.0)
Alkaline Phosphatase: 96 U/L (ref 38–126)
Anion gap: 11 (ref 5–15)
BUN: 10 mg/dL (ref 6–20)
CO2: 26 mmol/L (ref 22–32)
Calcium: 9.2 mg/dL (ref 8.9–10.3)
Chloride: 103 mmol/L (ref 98–111)
Creatinine, Ser: 0.88 mg/dL (ref 0.44–1.00)
GFR calc Af Amer: >60 mL/min (ref >60)
GFR calc non Af Amer: >60 mL/min (ref >60)
Glucose, Bld: 121 mg/dL — ABNORMAL HIGH (ref 70–99)
Potassium: 4.1 mmol/L (ref 3.5–5.1)
Sodium: 140 mmol/L (ref 135–145)
Total Bilirubin: 0.5 mg/dL (ref 0.3–1.2)
Total Protein: 6.9 g/dL (ref 6.5–8.1)

## 2020-04-18 LAB — BETA HCG QUANT (REF LAB): hCG Quant: 421 m[IU]/mL

## 2020-04-18 NOTE — Progress Notes (Signed)
Interpreter present for encounter .  Pt presents for stat BHCG.  She denies having any pain or vaginal bleeding. Pt will be called with results later today. She stated that a detailed message in Spanish can be left on her voicemail if she does not answer.   1650  BHCG results reviewed by Dr. Crissie Reese.  1730  Called pt w/interpreter Luis # [phone] and informed her of BHCG results which shows an inappropriate rise. This is indicative of a failed pregnancy. The recommendation is for her to go to MAU for further treatment. She will likely be offered an injection which will help to resolve the pregnancy. Pt had difficulty understanding what this means and we discussed further that the pregnancy is not progressing normally. Pt was also reminded that we do not know if the pregnancy is in her uterus or her tube which can be very dangerous. I stated that her questions could be better answered once she is at the hospital. Pt asked if there was a chance the pregnancy would progress. I advised that the doctor has told me no however when she goes to the hospital she should ask her questions and be fully understanding of the situation. Pt voiced understanding and agreed to go to MAU as advised. Total time speaking with pt = 20 minutes.

## 2020-04-18 NOTE — MAU Provider Note (Signed)
History     CSN: [id number]  Arrival date and time: 04/18/20 4259   First Provider Initiated Contact with Patient 04/18/20 2029      Chief Complaint  Patient presents with   Possible Pregnancy   Tsering Leaman is a 39 y.o. G2P1001 at [redacted]w[redacted]d who presents today from the office for MTX treatment for abnormal rise in HCG with pregnancy of unknown location. Patient denies any pelvic pain or vaginal bleeding.   Vaginal Bleeding The patient's pertinent negatives include no pelvic pain, vaginal bleeding or vaginal discharge. This is a new problem. The current episode started in the past 7 days. The problem has been resolved. The patient is experiencing no pain. She is pregnant. Nothing aggravates the symptoms. She has tried nothing for the symptoms.    OB History    Gravida  2   Para  1   Term  1   Preterm      AB      Living  1     SAB      TAB      Ectopic      Multiple      Live Births  1           Past Medical History:  Diagnosis Date   Obesity (BMI 30-39.9)    PCOS (polycystic ovarian syndrome)     Past Surgical History:  Procedure Laterality Date   CESAREAN SECTION N/A 04/08/2014   Procedure: CESAREAN SECTION;  Surgeon: Woodroe Mode, MD;  Location: [place] [facility];  Service: Obstetrics;  Laterality: N/A;    Family History  Problem Relation Age of Onset   Diabetes Father    Diabetes Maternal Grandfather    Cancer Paternal Grandfather     Social History   Tobacco Use   Smoking status: Never Smoker   Smokeless tobacco: Never Used  Substance Use Topics   Alcohol use: No   Drug use: No    Allergies: No Known Allergies  Medications Prior to Admission  Medication Sig Dispense Refill Last Dose   metFORMIN (GLUCOPHAGE) 500 MG tablet Take 1 tablet (500 mg total) by mouth 2 (two) times daily with a meal. (Patient not taking: Reported on 04/18/2020) 60 tablet 3    Prenatal Vit-Fe Fumarate-FA (PREPLUS) 27-1 MG TABS Take 1 tablet by mouth  daily.      Vitamin D, Ergocalciferol, (DRISDOL) 1.25 MG (50000 UT) CAPS capsule Take 1 capsule (50,000 Units total) by mouth every 7 (seven) days. (Patient not taking: Reported on 04/18/2020) 5 capsule 2     Review of Systems  Genitourinary: Positive for vaginal bleeding. Negative for pelvic pain and vaginal discharge.  All other systems reviewed and are negative.  Physical Exam   Blood pressure 128/81, pulse (!) 101, temperature 98.8 F (37.1 C), temperature source Oral, resp. rate 20, height 5\' 5"  (1.651 m), weight 127.3 kg, last menstrual period 04/03/2020.  Physical Exam  Nursing note and vitals reviewed. Constitutional: She is oriented to person, place, and time. She appears well-developed and well-nourished.  HENT:  Head: Normocephalic.  Cardiovascular: Normal rate.  Respiratory: Effort normal.  Neurological: She is alert and oriented to person, place, and time.  Skin: Skin is warm and dry.  Psychiatric: She has a normal mood and affect.   Results for orders placed or performed during the hospital encounter of 04/18/20 (from the past 24 hour(s))  Comprehensive metabolic panel     Status: Abnormal   Collection Time: 04/18/20  6:59 PM  Result  Value Ref Range   Sodium 140 135 - 145 mmol/L   Potassium 4.1 3.5 - 5.1 mmol/L   Chloride 103 98 - 111 mmol/L   CO2 26 22 - 32 mmol/L   Glucose, Bld 121 (H) 70 - 99 mg/dL   BUN 10 6 - 20 mg/dL   Creatinine, Ser 5.28 0.44 - 1.00 mg/dL   Calcium 9.2 8.9 - 41.3 mg/dL   Total Protein 6.9 6.5 - 8.1 g/dL   Albumin 3.6 3.5 - 5.0 g/dL   AST 33 15 - 41 U/L   ALT 43 0 - 44 U/L   Alkaline Phosphatase 96 38 - 126 U/L   Total Bilirubin 0.5 0.3 - 1.2 mg/dL   GFR calc non Af Amer >60 >60 mL/min   GFR calc Af Amer >60 >60 mL/min   Anion gap 11 5 - 15  CBC     Status: Abnormal   Collection Time: 04/18/20  6:59 PM  Result Value Ref Range   WBC 9.6 4.0 - 10.5 K/uL   RBC 4.57 3.87 - 5.11 MIL/uL   Hemoglobin 13.2 12.0 - 15.0 g/dL   HCT 24.4  01.0 - 27.2 %   MCV 90.2 80.0 - 100.0 fL   MCH 28.9 26.0 - 34.0 pg   MCHC 32.0 30.0 - 36.0 g/dL   RDW 53.6 64.4 - 03.4 %   Platelets 411 (H) 150 - 400 K/uL   nRBC 0.0 0.0 - 0.2 %  . Results for SKYLEY, GRANDMAISON (MRN [id number]) as of 04/18/2020 20:50  Ref. Range 04/13/2020 20:22 04/18/2020 13:35  hCG Quant Latest Units: mIU/mL  421  HCG, Beta Chain, Quant, S Latest Ref Range: <5 mIU/mL 246 (H)    MAU Course  Procedures  MDM lengthy discussion with the patient with interpretor about the HCG levels and that the pregnancy could be in the tube or is not viable. Recommended MTX today. Patient is unsure about this. Questions answered. Patient would like to wait and recheck HCG in 48 hours. Discussed that if the pregnancy is in the tube it could rupture and cause bleeding and death.   Assessment and Plan   1. Pregnancy of unknown anatomic location   2. Inappropriate change in quantitative hCG in early pregnancy    DC home 1st Trimester precautions  Bleeding precautions Ectopic precautions RX: FU in 48 hours or sooner if any pain or bleeding develops   Follow-up Information    [facility] Follow up in 2 day(s).   Specialty: Obstetrics and Gynecology Why: Return thursday for repeat blood work  Contact information: [address] [id number] [place] [address] [phone]         Thressa Sheller DNP, CNM  04/18/20  8:54 PM

## 2020-04-18 NOTE — Discharge Instructions (Signed)
Embarazo ectópico  Ectopic Pregnancy    Un embarazo ectópico ocurre cuando un óvulo fecundado se desarrolla fuera del útero. El óvulo fecundado no puede sobrevivir fuera del útero. Este problema generalmente ocurre en las trompas de [place]. La causa suele ser un daño en la trompa de [place].  Si el problema se detecta a tiempo, puede tratarse con medicamentos que detienen el desarrollo del óvulo. Si la trompa se fisura o estalla (hay ruptura), tendrá una hemorragia interna. Con frecuencia, se siente un dolor muy intenso en la parte baja del vientre. Esto es una emergencia. Deberá someterse a una cirugía. Solicite ayuda de inmediato.  Siga estas indicaciones en su casa:  Después de recibir tratamiento con medicamentos o cirugía:  · Descanse y limite sus actividades durante el tiempo que le indique el médico.  · Siga estas indicaciones hasta que su médico le diga que es seguro dejar de hacerlo:  ? No levante ningún objeto que pese más de 10 libras (4,5 kg) o más del límite de peso que su médico le indique que puede levantar.  ? Evite hacer ejercicios y cualquier movimiento que requieran mucho esfuerzo.  · Para prevenir problemas al ir de cuerpo (estreñimiento):  ? Siga una dieta saludable. Esto incluye lo siguiente:  § Frutas.  § Vegetales.  § Cereales integrales.  ? Beba entre 6 y 8 vasos de agua por día.  Comuníquese con un médico si:  Solicite ayuda de inmediato si:  · Siente un dolor muy intenso y repentino en el vientre.  · Tiene un dolor muy intenso en los hombros o en el cuello.  · Siente que el dolor empeora y no se alivia con los medicamentos.  · Tiene los siguientes síntomas:  ? Fiebre o escalofríos.  ? Hemorragia vaginal.  ? Enrojecimiento o hinchazón en el lugar del corte quirúrgico (incisión).  · Tiene malestar estomacal (náuseas) o vomita.  · Se siente mareada o débil.  · Se siente mareada o se desvanece (se desmaya).  Resumen  · Un embarazo ectópico ocurre cuando un óvulo fecundado se desarrolla fuera  del útero.  · Si el problema se detecta a tiempo, puede tratarse con medicamentos que detienen el desarrollo del óvulo.  · Si la trompa se fisura o estalla (hay ruptura), necesitará cirugía. Esto es una emergencia. Solicite ayuda de inmediato.  Esta información no tiene como fin reemplazar el consejo del médico. Asegúrese de hacerle al médico cualquier pregunta que tenga.  Document Revised: 06/09/2017 Document Reviewed: 06/09/2017  Elsevier Patient Education © 2020 [employer]

## 2020-04-18 NOTE — Progress Notes (Signed)
Chart reviewed for nurse visit. Agree with plan of care, much appreciate RN reviewing and explaining situation in detail with patient.   Venora Maples, MD 04/18/2020 5:58 PM

## 2020-04-18 NOTE — MAU Note (Signed)
PT SAYS LAST VISIT- HAD LABS . WAS IN ER THIS AM. SO SHE CAME HERE FOR QUESTIONS.  NO PAIN  NO BLEEDING.

## 2020-04-20 ENCOUNTER — Inpatient Hospital Stay (HOSPITAL_COMMUNITY)
Admission: AD | Admit: 2020-04-20 | Discharge: 2020-04-20 | Disposition: A | Discharge status: Home / Self Care | Payer: Self-pay | Attending: Obstetrics and Gynecology | Admitting: Obstetrics and Gynecology

## 2020-04-20 ENCOUNTER — Other Ambulatory Visit: Payer: Self-pay

## 2020-04-20 DIAGNOSIS — Z3A00 Weeks of gestation of pregnancy not specified: Secondary | ICD-10-CM | POA: Insufficient documentation

## 2020-04-20 DIAGNOSIS — Z6790 Unspecified blood type, Rh positive: Secondary | ICD-10-CM

## 2020-04-20 DIAGNOSIS — O00202 Left ovarian pregnancy without intrauterine pregnancy: Secondary | ICD-10-CM

## 2020-04-20 DIAGNOSIS — O009 Unspecified ectopic pregnancy without intrauterine pregnancy: Secondary | ICD-10-CM | POA: Insufficient documentation

## 2020-04-20 LAB — COMPREHENSIVE METABOLIC PANEL
ALT: 43 U/L (ref 0–44)
AST: 34 U/L (ref 15–41)
Albumin: 3.8 g/dL (ref 3.5–5.0)
Alkaline Phosphatase: 83 U/L (ref 38–126)
Anion gap: 8 (ref 5–15)
BUN: 9 mg/dL (ref 6–20)
CO2: 26 mmol/L (ref 22–32)
Calcium: 9.4 mg/dL (ref 8.9–10.3)
Chloride: 104 mmol/L (ref 98–111)
Creatinine, Ser: 0.81 mg/dL (ref 0.44–1.00)
GFR calc Af Amer: >60 mL/min (ref >60)
GFR calc non Af Amer: >60 mL/min (ref >60)
Glucose, Bld: 102 mg/dL — ABNORMAL HIGH (ref 70–99)
Potassium: 3.7 mmol/L (ref 3.5–5.1)
Sodium: 138 mmol/L (ref 135–145)
Total Bilirubin: 0.5 mg/dL (ref 0.3–1.2)
Total Protein: 7.3 g/dL (ref 6.5–8.1)

## 2020-04-20 LAB — CBC
HCT: 42.6 % (ref 36.0–46.0)
Hemoglobin: 13.7 g/dL (ref 12.0–15.0)
MCH: 28.7 pg (ref 26.0–34.0)
MCHC: 32.2 g/dL (ref 30.0–36.0)
MCV: 89.3 fL (ref 80.0–100.0)
Platelets: 414 10*3/uL — ABNORMAL HIGH (ref 150–400)
RBC: 4.77 MIL/uL (ref 3.87–5.11)
RDW: 13.3 % (ref 11.5–15.5)
WBC: 10.8 10*3/uL — ABNORMAL HIGH (ref 4.0–10.5)
nRBC: 0 % (ref 0.0–0.2)

## 2020-04-20 LAB — HCG, QUANTITATIVE, PREGNANCY: hCG, Beta Chain, Quant, S: 610 m[IU]/mL — ABNORMAL HIGH (ref <5)

## 2020-04-20 MED ORDER — METHOTREXATE FOR ECTOPIC PREGNANCY
50.0000 mg/m2 | Freq: Once | INTRAMUSCULAR | Status: AC
  Administered 2020-04-20: 120 mg via INTRAMUSCULAR
  Filled 2020-04-20: qty 1

## 2020-04-20 NOTE — MAU Note (Signed)
Pt had [place] at infertility office today, probable ectopic adjacent to left ovary.  Pt verbalized understanding and need for Methotrexate.  Asking about follow up.  Pt is doing ok, denies any pain, no bleeding.explained to pt how medication works and what to expect.

## 2020-04-20 NOTE — MAU Provider Note (Signed)
First Provider Initiated Contact with Patient 04/20/20 1735      S Ms. Ashley Grant is a 39 y.o. [id number] pregnant female who presents to MAU today with complaint of left ectopic pregnancy. Patient was offered and declined MTX on 04/18/2020 in MAU. Patient then was seen at [facility] today for an ultrasound s/p fertility treatment and was found to have a left adnexal mass consistent with an ectopic pregnancy and was counseled on MTX and sent to MAU for injection. See [place] report scanned in to chart and at the end of this note. Patient denies any pain or bleeding at this time.  Spanish translator used for entire visit.   O BP (!) 151/97 (BP Location: Right Arm)   Pulse (!) 107   Temp 99.4 F (37.4 C) (Oral)   Resp 20   Ht 5\' 5"  (1.651 m)   Wt 127.3 kg   LMP 04/03/2020 (Exact Date)   SpO2 98%   BMI 46.71 kg/m    Patient Vitals for the past 24 hrs:  BP Temp Temp src Pulse Resp SpO2 Height Weight  04/20/20 1644 (!) 151/97 99.4 F (37.4 C) Oral (!) 107 20 98 % 5\' 5"  (1.651 m) 127.3 kg   Physical Exam  Constitutional:  Non-toxic appearance. She does not appear ill. No distress.  HENT:  Head: Normocephalic and atraumatic.  Respiratory: Effort normal.  GI: Normal appearance.  Neurological: She is alert.  Skin: She is not diaphoretic.  Psychiatric: Her behavior is normal. Mood, judgment and thought content normal.   A Pregnant female Medical screening exam complete  Left ectopic pregnancy Consulted with Dr. Kennon Rounds, [place] to proceed with MTX in light of abnormal hCG, [place] today and platelets 411. No need to repeat US today.  The risks of methotrexate were reviewed including failure requiring repeat dosing or eventual surgery. She understands that methotrexate involves frequent return visits to monitor lab values and that she remains at risk of ectopic rupture until her beta is less than assay. ?The patient opts to proceed with methotrexate.  She has no history of  hepatic or renal dysfunction, has normal BUN/Cr/LFT's/platelets.  She is felt to be reliable for follow-up. Side effects of photosensitivity & GI upset were discussed.  She knows to avoid direct sunlight and abstain from alcohol, NSAIDs and sexual intercourse for two weeks. She was counseled to discontinue any MVI with folic acid. ?She understands to follow up on D4 (Sunday 04/23/2020, MAU) and D7 (Wednesday 04/26/2020, MAU) for repeat BHCG and was given the instruction sheet. ?Strict ectopic precautions were reviewed, the patient knows to call with any abdominal pain, vomiting, fainting, or any concerns with her health.  Day 0/1 Day 4 Day 7  Sunday Wednesday Saturday  Monday Thursday Sunday  Tuesday Friday Monday  Wednesday Saturday Tuesday  Thursday Sunday Wednesday  Friday Monday Thursday  Saturday Tuesday Friday    Methotrexate Treatment Protocol for Ectopic Pregnancy  [x] Pretreatment testing and instructions  [x] hCG concentration (610)  [x] Transvaginal ultrasound - completed at [facility], brought by patient, scanned in to chart [x] Blood group and Rh(D) typing (O Positive) [x] Complete blood count - no abnormalities requiring treatment [x] Liver and renal function tests - WNL [x] Discontinue folic acid supplements  [x] Counsel patient to avoid NSAIDs, recommend acetaminophen if an analgesic is needed  [x] Advise patient to refrain from sexual intercourse and strenuous exercise  Treatment day  Single dose protocol   1  hCG.  Administer Methotrexate 50 mg/m2 body surface area IM  4  hCG  7  hCG  If <15 percent hCG decline from day 4 to 7, give additional dose of methotrexate 50 mg/m2 IM  If ?15 percent hCG decline from day 4 to 7, draw hCG weekly until undetectable  14  hCG  If <15 percent hCG decline from day 7 to 14, give additional dose of methotrexate 50 mg/m2 IM  If ?15 percent hCG decline from day 7 to 14, check hCG weekly until undetectable  21 and 28  If 3 doses have  been given and there is a <15 percent hCG decline from day 21 to 28, proceed with laparoscopic surgery  Laparoscopy  If severe abdominal pain or an acute abdomen suggestive of tubal rupture occurs If ultrasonography reveals greater than 300 mL pelvic or other intraperitoneal fluid  The hCG concentration usually declines to less than 15 mIU/mL by 35 days postinjection but may take as long as 109 days. If the hCG does not decline to zero, a new pregnancy should be excluded; if the hCG is rising, a transvaginal ultrasound should be performed. Alternatively, some patients have a slow clearance of serum hCG. If three weekly values are similar, consider an additional dose of MTX (50 mg/m2) not to exceed the recommended maximum of three total doses. This typically accelerates the decline of serum hCG. The risk of gestational trophoblastic disease is low. Folinic acid rescue is not required for women treated with the single-dose protocol, even if multiple doses are ultimately given.   Prepared with data from:   [facility]. Clinical practice. Ectopic pregnancy. Malva Limes Med 2009; 361:379  American College of Obstetricians and Gynecologists. ACOG Practice Bulletin No. 94: Medical management of ectopic pregnancy. Obstet Gynecol 2008; 295:1884.   P Discharge from MAU in stable condition Patient to return to MAU on Sunday and next Wednesday AM for repeat hCG Warning signs for worsening condition that would warrant emergency follow-up discussed Patient may return to MAU as needed for pregnancy related complaints  Krishang Reading, Odie Sera, NP 04/20/2020 6:16 PM    Media Information   Document Information  Photos  Ultrasound Report from [facility] on 04/20/20  04/20/2020 18:09  Attached To:  Hospital Encounter on 04/20/20  Source Information  Sharyon Cable, CNM  [facility]

## 2020-04-20 NOTE — Discharge Instructions (Signed)
Tratamiento con metotrexato para Firefighter ectpico Methotrexate Treatment for an Ectopic Pregnancy  El metotrexato es un medicamento para tratar Firefighter ectpico. Un embarazo ectpico es aquel en el que el feto se desarrolla fuera del tero. Este tipo de Psychiatrist puede ser peligroso. El metotrexato se [place] para interrumpir el crecimiento del vulo fecundado. Tambin ayuda a que el cuerpo absorba el tejido del vulo. Esto demora de 2a 6semanas. La mayora de los embarazos ectpicos pueden tratarse satisfactoriamente con metotrexato si se diagnostican en una etapa inicial. Informe al mdico acerca de lo siguiente:  Cualquier alergia que tenga.  Todos los [employer] est tomando, incluso vitaminas, hierbas, gotas oftlmicas, cremas y [address] de [address].  Cualquier enfermedad que tenga. Cules son los riesgos? En general, es un tratamiento seguro. Sin embargo, pueden presentarse problemas, por ejemplo:  Nuseas, vmitos o ambos.  Sangrado o hemorragia vaginal.  Diarrea.  Clicos abdominales.  Mareos o sensacin de desvanecimiento.  Llagas en la boca.  Hinchazn o irritacin del revestimiento pulmonar (neumonitis).  Dao heptico.  Cada del cabello. Existe el riesgo de que el tratamiento con metotrexato no sea Engineer, manufacturing y que el embarazo contine su curso. Tambin existe el riesgo de posible ruptura del embarazo ectpico mientras se toma este medicamento. Qu ocurre antes del procedimiento?  Le realizarn pruebas hepticas, renales y un anlisis de sangre completo.  Se harn anlisis de sangre para [employer] de hormonas del [place] y Chief Strategy Officer su tipo sanguneo.  Si su factor sanguneo es Rhnegativo y el del padre es Rhpositivo, o no sabe su Rh, usted recibir una inyeccin de inmunoglobulina Rho(D). Qu ocurre durante el procedimiento? El mdico puede recetarle el metotrexato inyectable o en pastillas. Segn su respuesta al tratamiento, el  metotrexato se puede administrar como una dosis nica o una serie de dosis.  El Counsellor las inyecciones de metotrexato. Esta es la forma ms comn de Secondary school teacher para tratar Chartered loss adjuster ectpico.  Si le recetan metotrexato por va oral, es muy importante que siga las indicaciones del mdico respecto a cmo Conservator, museum/gallery. Probablemente, necesite otros medicamentos para controlar un embarazo ectpico. Este procedimiento puede variar segn el mdico y el hospital. Ladell Heads sucede despus del procedimiento?  Puede tener clicos abdominales, sangrado vaginal y fatiga.  Le harn anlisis de [place] en intervalos programados durante varios das o semanas para controlar los niveles de hormonas del [place]. Los [employer] Corporation de sangre se Geneticist, molecular que ya no se detecte la hormona del Psychiatrist en la [place].  Si el tratamiento con metotrexato no es Research officer, political party, es posible que deban hacerle un procedimiento quirrgico para Hospital doctor.  Siga las indicaciones del mdico acerca de cmo y cundo informar sntomas que pudieran indicar la ruptura de un Psychiatrist ectpico. Resumen  El metotrexato es un medicamento para tratar Firefighter ectpico.  El metotrexato se puede administrar como una dosis nica o una serie de dosis durante un tiempo.  Le harn anlisis de [place] en intervalos programados durante varios das o semanas para controlar los niveles de hormonas del [place]. Se realizarn anlisis de sangre hasta que no se detecten ms hormonas del Arts development officer.  Existe el riesgo de que el tratamiento con metotrexato no sea Engineer, manufacturing y que el embarazo contine su curso. Tambin existe el riesgo de posible ruptura del embarazo ectpico mientras se toma este medicamento. Esta informacin no tiene Theme park manager el consejo del mdico. Asegrese de hacerle al mdico cualquier  pregunta que tenga. Document Revised: 07/24/2017 Document Reviewed:  07/24/2017 Elsevier Patient Education  2020 Elsevier Inc.         Vanetta MuldersEmbarazo ectpico (Ectopic Pregnancy) Un embarazo ectpico ocurre cuando el vulo fertilizado se fija (implanta) fuera del tero. La mayora de los embarazos ectpicos ocurren en uno de los tubos por donde viajan los vulos desde el ovario al tero (trompas de Falopio), pero la implantacin puede ocurrir en otros lugares. En casos poco frecuentes, ocurren en el ovario, el intestino, la pelvis, el abdomen o el cuello del tero. En un embarazo ectpico, el vulo fertilizado no tiene la capacidad de llegar a ser un beb normal y sano. Un embarazo ectpico con ruptura se produce cuando una trompa de Falopio se rompe o estalla y provoca una hemorragia interna. Suele haber dolor intenso en la parte inferior del abdomen, y, en ocasiones, hay hemorragia vaginal. Tener un embarazo ectpico puede ser Neomia Dearuna experiencia que pone en peligro la vida. Si esta peligrosa afeccin no se trata, puede producir prdida de sangre, shock o incluso la muerte. CAUSAS La causa ms frecuente de esta afeccin es un dao en una de las trompas de CovinaFalopio. Una trompa de Falopio puede estar obstruida o Dietitianestrecha, y esto impide que el vulo fertilizado llegue al tero. FACTORES DE RIESGO Es ms probable que esta afeccin se manifieste en mujeres que estn en edad frtil y tienen diferentes niveles de [place] Runriesgo. Los niveles de riesgo pueden dividirse en tres categoras. Alto riesgo  Usted ha realizado tratamientos para la infertilidad.  Ha tenido un embarazo ectpico antes.  Se someti a una Eastman Kodakciruga en las trompas de Falopio u otro procedimiento quirrgico, como un aborto.  Tuvo una ciruga para ligar las trompas de Falopio (ligadura de trompas).  Tiene algn problema o enfermedad en las trompas de Falopio.  Estuvo expuesta a dietiletilbestrol (DES). Este es un medicamento que se Carmel Sacramentoutiliz hasta 1971 y Cox Communicationstuvo efectos en los bebs cuyas madres lo tomaron.  Queda  embarazada mientras [place]a un DIU (dispositivo intrauterino) como mtodo anticonceptivo. Riesgo moderado  Tiene antecedentes de infertilidad.  Tuvo una ITS (infeccin de transmisin sexual).  Tiene antecedentes de enfermedad plvica inflamatoria (EPI).  Tiene cicatrices por endometriosis.  Tiene mltiples parejas sexuales.  Fuma. Bajo riesgo  Se someti a una ciruga plvica.  [place] duchas vaginales.  Comenz a ser Wm. Wrigley Jr. Companysexualmente activa antes de los 18 aos de [place]. SNTOMAS Los sntomas frecuentes de esta afeccin incluyen los sntomas normales del [place] Islandembarazo, como prdida del perodo menstrual, nuseas, cansancio, dolor abdominal, dolor en las mamas a la palpacin y Restaurant manager, fast foodhemorragia. No obstante, el embarazo ectpico tiene sntomas adicionales, como los siguientes:  Dolor durante las relaciones sexuales.  Hemorragia vaginal o manchado irregular.  Clicos o dolor en uno de los lados o en la zona inferior del abdomen.  Frecuencia cardaca rpida, baja presin arterial y sudoracin.  Desmayarse al defecar. Los sntomas de un embarazo ectpico con ruptura y hemorragias internas son:  Dolor intenso y sbito en el abdomen y la pelvis.  Mareos, debilidad, sensacin de desvanecimiento o desmayos.  Dolor en la zona del hombro o el cuello. DIAGNSTICO Esta afeccin se diagnostica mediante:  Examen plvico para localizar el dolor o un bulto en el abdomen.  Test de embarazo. Este anlisis de sangre se realiza para Landscape architectdetectar la presencia y Air cabin crewel nivel especfico de la hormona del Psychiatristembarazo en el torrente circulatorio.  Ecografa. Esto se realiza cuando una prueba de [place]embarazo es positiva. En esta prueba, se introduce una sonda  en la vagina. La sonda detecta el feto, posiblemente en un lugar que no es el tero.  Extraccin de [place] de tejido del tero (dilatacin y curetaje o D y C).  Ciruga para Sales promotion account executive visual del interior del abdomen usando un tubo delgado, que emite luz y tiene  una pequea cmara en el extremo (laparoscopio).  Culdocentesis. En este procedimiento, se inserta una aguja en la parte superior de la vagina, detrs del tero. Si hay sangre en esta zona, esto puede indicar que una trompa de Falopio est rota. TRATAMIENTO Esta afeccin se trata con medicamentos o con [place]. Medicamentos  Le pueden administrar una inyeccin de un medicamento (metotrexato) para hacer que el tejido del Media planner se [place]. Este medicamento puede salvar la trompa de [place]. Puede administrarse si: ? El diagnstico se realiza de forma temprana, sin signos de Teacher, adult education. ? La trompa de Falopio no se ha roto. ? Se la considera una buena candidata para recibir Dentist. Por lo general, despus del tratamiento con metotrexato se comprueban los niveles de hormonas del [place]. Esto se hace para asegurarse de que el medicamento es [place]. El embarazo se absorbe despus de 4 a 6semanas. La mayora de los embarazos se absorbe antes de las 3semanas. Ciruga  Se puede utilizar un laparoscopio para retirar el tejido del Media planner.  Si ocurre una hemorragia interna grave, se puede Chartered certified accountant corte (incisin) ms grande en la parte inferior del abdomen (laparotoma) para extraer el feto y la placenta. Esto se hace para detener la hemorragia.  Se puede extraer una parte de la trompa de Falopio o toda (salpingectoma) junto con el feto y la placenta. La trompa de Falopio tambin se puede reparar durante la [place].  En circunstancias muy poco frecuentes, puede ser necesario extraer el tero (histerectoma).  Luego de la [place], pueden analizar la hormona del embarazo para asegurarse de que no quedan tejidos. Ya sea que el tratamiento consista en medicamentos o en una [place], es posible que reciba una inyeccin de inmunoglobulina Rho(D) para prevenir problemas con un embarazo futuro. Esta inyeccin se puede administrar si:  Tiene sangre Rh negativa y el padre del beb  tiene sangre Rh positiva.  Tiene sangre Rh negativa y no sabe el tipo de Rh del padre del beb. INSTRUCCIONES PARA EL CUIDADO EN EL HOGAR  Haga reposo y limite su actividad despus del procedimiento durante el tiempo que le indique el mdico.  Hasta que el mdico lo autorice: ? No levante objetos que pesen ms de 10libras (4,5kg) o el lmite que le indique el mdico. ? Evite el ejercicio fsico y cualquier movimiento que requiera esfuerzo (que sea extenuante).  Para ayudar a evitar el estreimiento: ? Consuma una dieta saludable que incluya frutas, verduras y cereales integrales. ? Pollyann Samples 6 y 8vasos de agua por Training and development officer. SOLICITE ATENCIN MDICA DE INMEDIATO SI:  El dolor empeora y no se alivia con Dentist.  Tiene los siguientes sntomas: ? Cristy Hilts o escalofros. ? Hemorragia vaginal. ? Enrojecimiento e hinchazn en el lugar de la incisin. ? Nuseas y vmitos.  Siente que se desvanece o est dbil.  Se siente mareada o se desmaya. Esta informacin no tiene Marine scientist el consejo del mdico. Asegrese de hacerle al mdico cualquier pregunta que tenga. Document Revised: 09/17/2016 Document Reviewed: 05/29/2016 Elsevier Patient Education  [place] con metotrexato para el embarazo ectpico, cuidados posteriores Methotrexate Treatment for an Ectopic  Pregnancy, Care After En esta hoja se incluye informacin sobre cmo debe cuidarse despus del procedimiento. A su vez, el mdico podr darle indicaciones ms especficas. Comunquese con el mdico si tiene problemas o preguntas. Qu puedo esperar despus del procedimiento? Despus del procedimiento, es comn [employer] siguientes sntomas:  Clicos abdominales.  Hemorragia vaginal.  Fatiga.  Nuseas.  Vmitos.  Diarrea. Le harn anlisis de [place] en intervalos programados durante varios das o semanas para controlar los niveles de hormonas del [place]. Los [employer] Corporation de  sangre se Geneticist, molecular que ya no se detecte la hormona del Psychiatrist en la [place]. Siga estas indicaciones en su casa: Actividad  No tenga relaciones sexuales hasta que el mdico la autorice.  Limite las actividades que demandan mucho esfuerzo como se lo haya indicado el mdico. [employer] medicamentos de venta libre y los recetados solamente como se lo haya indicado el mdico.  No tome aspirina, ibuprofeno, naproxeno ni cualquier otro antiinflamatorio no esteroideo (AINE).  No tome cido flico, vitaminas prenatales ni otras vitaminas que contengan cido flico. Instrucciones generales   No beba alcohol.  Siga las indicaciones del mdico acerca de cmo y cundo informar sntomas que pudieran indicar la ruptura de un Psychiatrist ectpico.  Oceanographer a todas las visitas de seguimiento como se lo haya indicado el mdico. Esto es importante. Comunquese con un mdico si:  Tiene nuseas o vmitos persistentes.  Tiene diarrea persistente.  Tiene una reaccin adversa a los medicamentos, como por ejemplo: ? Cansancio. ? Erupcin cutnea. ? Cada del cabello. Solicite ayuda de inmediato si:  El dolor abdominal o plvico empeora.  Tiene ms sangrado vaginal.  Se siente mareada o se desmaya.  Le falta el aire.  Aumenta su frecuencia cardaca.  Tiene tos.  Tiene escalofros.  Tiene fiebre. Resumen  Despus del procedimiento, es comn tener clicos abdominales, sangrado vaginal y Management consultant. Tambin puede tener otros sntomas.  Le harn anlisis de [place] en intervalos programados durante varios das o semanas para controlar los niveles de hormonas del [place]. Los [employer] Corporation de sangre se Geneticist, molecular que ya no se detecte la hormona del Psychiatrist en la [place].  Limite la actividad extenuante segn las indicaciones del mdico.  Siga las indicaciones del mdico acerca de cmo y cundo informar sntomas que pudieran indicar la ruptura de un embarazo ectpico. Esta  informacin no tiene Theme park manager el consejo del mdico. Asegrese de hacerle al mdico cualquier pregunta que tenga. Document Revised: 07/24/2017 Document Reviewed: 07/24/2017 Elsevier Patient Education  2020 [employer].         Ruptura de un embarazo ectpico Ruptured Ectopic Pregnancy  Un embarazo ectpico ocurre cuando el vulo fertilizado se fija (implanta) fuera del tero, generalmente, en una trompa de Falopio. Una ruptura de un embarazo ectpico ocurre cuando la trompa de Falopio se desgarra o se rompe. Como [place], se produce una hemorragia interna, un intenso dolor abdominal y, en algunos casos, sangrado vaginal. La mayora de los embarazos ectpicos se producen en la trompa de [place]. En casos poco frecuentes, podra ocurrir en un ovario, el intestino, la pelvis, el abdomen o el cuello uterino. Un embarazo ectpico no tiene la capacidad de llegar a ser un beb normal y sano. La ruptura de un embarazo ectpico puede afectar su capacidad para tener hijos (fertilidad), segn el dao que cause en los rganos genitales. La ruptura de un embarazo ectpico es una emergencia mdica. Si no se trata de forma inmediata, puede provocar una prdida de [place],  choque o, incluso, la muerte. Cules son las causas? La mayora de los embarazos ectpicos son consecuencia de un dao en las trompas de [place]. El dao no permite que el vulo fertilizado se implante en el tero. En algunos casos, es posible que la causa no se conozca. Qu incrementa el riesgo? Tiene un mayor riesgo de presentar un Multimedia programmer en los siguientes casos:  Ha tenido un embarazo ectpico previo.  Se someti a una ciruga de trompas de Falopio anteriormente.  Tuvo una ciruga previa para ligar las trompas de Falopio (ligadura de trompas).  Se realiz tratamientos de fertilidad o tiene antecedentes de infertilidad.  Ha estado expuesta al dietilestilbestrol (DES). El DES es un medicamento que se  [place] [employer] 1971 y [employer] en los bebs cuyas madres lo tomaron.  [place] un dispositivo intrauterino (DIU) como mtodo anticonceptivo.  [place] anticonceptivos orales gestagnicos como mtodo anticonceptivo.  Tiene antecedentes de enfermedad plvica inflamatoria (EPI).  Tiene antecedentes de endometriosis.  Fuma.  Comenz a ser Wm[employer]ley Jr. Company de los 18 aos de [place].  Tiene mltiples parejas sexuales. Cules son los signos o los sntomas? Algunos de los sntomas de un embarazo ectpico con ruptura y hemorragias internas son los siguientes:  Dolor intenso y sbito en el abdomen y la pelvis.  Mareos o [employer].  Dolor en la zona del hombro.  Hemorragia vaginal. Cmo se diagnostica? Esta afeccin se diagnostica en funcin de sus antecedentes mdicos, los sntomas, un examen fsico y ciertos estudios, por ejemplo:  Una prueba de [place].  Una ecografa.  Medicin de los niveles de la hormona del Psychiatrist en el torrente sanguneo.  Extraccin de [place] de tejido del tero (dilatacin y curetaje, D y C).  Ciruga para Charity fundraiser visual del interior del abdomen mediante un tubo iluminado (laparoscopia). Cmo se trata? Esta afeccin se trata con la administracin de lquidos por va intravenosa y [place] para extraer el Psychiatrist ectpico y reparar la zona donde ocurri la ruptura. Si ha perdido [employer], Pension scheme manager una transfusin. Quizs le coloquen una vacuna de inmunoglobulina Rho(D) si usted es Rh negativo y el padre del beb es Rh positivo, o si no conoce el tipo de Rh del padre. Esto ayuda a prevenir problemas con el factor Rh en futuros embarazos. Se podran administrar otros medicamentos. Solicite ayuda de inmediato si:  Toma medicamentos para tratar Chartered loss adjuster ectpico y presenta sntomas de [place]. Estos incluyen los siguientes: ? Grant Ruts o escalofros. ? Dolor en el hombro. ? Hemorragia vaginal. ? Nuseas y  vmitos. ? Dolor abdominal o clicos intestinales intensos. ? Desmayos o sensacin de desvanecimiento. Resumen  Un embarazo ectpico ocurre cuando el vulo fertilizado se fija (implanta) fuera del tero, generalmente, en una trompa de Falopio. Una ruptura de un embarazo ectpico ocurre cuando la trompa de Falopio se desgarra o se rompe.  La ruptura de un embarazo ectpico es una emergencia mdica. Si no se trata de forma inmediata, puede provocar una prdida de [place], cho[place] o, incluso, la [place].  Esta afeccin se trata con la administracin de lquidos por va intravenosa y [place] para extraer el Psychiatrist ectpico y reparar la zona donde ocurri la ruptura. Si ha perdido [employer], Pension scheme manager una transfusin. Esta informacin no tiene Theme park manager el consejo del mdico. Asegrese de hacerle al mdico cualquier pregunta que tenga. Document Revised: 07/17/2017 Document Reviewed: 07/17/2017 Elsevier Patient Education  2020 [employer].

## 2020-04-23 ENCOUNTER — Inpatient Hospital Stay (HOSPITAL_COMMUNITY)
Admission: AD | Admit: 2020-04-23 | Discharge: 2020-04-23 | Disposition: A | Discharge status: Home / Self Care | Payer: Self-pay | Attending: Family Medicine | Admitting: Family Medicine

## 2020-04-23 ENCOUNTER — Other Ambulatory Visit: Payer: Self-pay

## 2020-04-23 DIAGNOSIS — O00102 Left tubal pregnancy without intrauterine pregnancy: Secondary | ICD-10-CM | POA: Insufficient documentation

## 2020-04-23 DIAGNOSIS — Z3A01 Less than 8 weeks gestation of pregnancy: Secondary | ICD-10-CM | POA: Insufficient documentation

## 2020-04-23 LAB — HCG, QUANTITATIVE, PREGNANCY: hCG, Beta Chain, Quant, S: 770 m[IU]/mL — ABNORMAL HIGH (ref <5)

## 2020-04-23 NOTE — MAU Provider Note (Signed)
*  Spanish interpreter used for this encounter*  Ms. Ashley Grant  is a 39 y.o. G2P1001  at [redacted]w[redacted]d who presents to MAU today for follow-up quant hCG. She is day 4 s/p methotrexate for a left ectopic pregnancy. The patient denies abdominal pain, vaginal bleeding, N/V or fever.    BP (!) 141/86 (BP Location: Right Arm)   Pulse (!) 102   Temp 98.7 F (37.1 C) (Oral)   Resp 16   LMP 04/03/2020 (Exact Date)   SpO2 97% Comment: room air  GENERAL: Well-developed, well-nourished female in no acute distress.  HEENT: Normocephalic, atraumatic.   LUNGS: Effort normal HEART: Regular rate  SKIN: Warm, dry and without erythema PSYCH: Normal mood and affect    A: 1. Left tubal pregnancy without intrauterine pregnancy   -slight increase in HCG. No pain or bleeding. Will bring to office on Wednesday for day 7 labs.  Component     Latest Ref Rng & Units 04/20/2020 04/23/2020  HCG, Beta Chain, Quant, S     <5 mIU/mL 610 (H) 770 (H)     P: Discharge home Strict return precautions for ectopic pregnancy Scheduled for stat HCG on Wednesday at [facility], Denny Peon, NP  04/23/2020 2:59 PM

## 2020-04-23 NOTE — Discharge Instructions (Signed)
Tratamiento con metotrexato para el embarazo ectópico, cuidados posteriores °Methotrexate Treatment for an Ectopic Pregnancy, Care After °En esta hoja se incluye información sobre cómo debe cuidarse después del procedimiento. A su vez, el médico podrá darle indicaciones más específicas. Comuníquese con el médico si tiene problemas o preguntas. °¿Qué puedo esperar después del procedimiento? °Después del procedimiento, es común tener los siguientes síntomas: °· Cólicos abdominales. °· Hemorragia vaginal. °· Fatiga. °· Náuseas. °· Vómitos. °· Diarrea. °Le harán análisis de sangre en intervalos programados durante varios días o semanas para controlar los niveles de hormonas del embarazo. Los análisis de sangre se realizarán hasta que ya no se detecte la hormona del embarazo en la sangre. °Siga estas indicaciones en su casa: °Actividad °· No tenga relaciones sexuales hasta que el médico la autorice. °· Limite las actividades que demandan mucho esfuerzo como se lo haya indicado el médico. °Medicamentos °· Tome los medicamentos de venta libre y los recetados solamente como se lo haya indicado el médico. °· No tome aspirina, ibuprofeno, naproxeno ni cualquier otro antiinflamatorio no esteroideo (AINE). °· No tome ácido fólico, vitaminas prenatales ni otras vitaminas que contengan ácido fólico. °Instrucciones generales ° °· No beba alcohol. °· Siga las indicaciones del médico acerca de cómo y cuándo informar síntomas que pudieran indicar la ruptura de un embarazo ectópico. °· Concurra a todas las visitas de seguimiento como se lo haya indicado el médico. Esto es importante. °Comuníquese con un médico si: °· Tiene náuseas o vómitos persistentes. °· Tiene diarrea persistente. °· Tiene una reacción adversa a los medicamentos, como por ejemplo: °? Cansancio. °? Erupción cutánea. °? Caída del cabello. °Solicite ayuda de inmediato si: °· El dolor abdominal o pélvico empeora. °· Tiene más sangrado vaginal. °· Se siente mareada o se  desmaya. °· Le falta el aire. °· Aumenta su frecuencia cardíaca. °· Tiene tos. °· Tiene escalofríos. °· Tiene fiebre. °Resumen °· Después del procedimiento, es común tener cólicos abdominales, sangrado vaginal y fatiga. También puede tener otros síntomas. °· Le harán análisis de sangre en intervalos programados durante varios días o semanas para controlar los niveles de hormonas del embarazo. Los análisis de sangre se realizarán hasta que ya no se detecte la hormona del embarazo en la sangre. °· Limite la actividad extenuante según las indicaciones del médico. °· Siga las indicaciones del médico acerca de cómo y cuándo informar síntomas que pudieran indicar la ruptura de un embarazo ectópico. °Esta información no tiene como fin reemplazar el consejo del médico. Asegúrese de hacerle al médico cualquier pregunta que tenga. °Document Revised: 07/24/2017 Document Reviewed: 07/24/2017 °Elsevier Patient Education © 2020 Elsevier Inc. ° °

## 2020-04-23 NOTE — MAU Note (Signed)
Ashley Grant is a 39 y.o. at [redacted]w[redacted]d here in MAU reporting: here for day 4 labs post MTX. Denies pain and bleeding, is concerned about this.   Pain score: 0/10  Vitals:   04/23/20 1329  BP: (!) 141/86  Pulse: (!) 102  Resp: 16  Temp: 98.7 F (37.1 C)  SpO2: 97%     Lab orders placed from triage: hcg

## 2020-04-26 ENCOUNTER — Ambulatory Visit (INDEPENDENT_AMBULATORY_CARE_PROVIDER_SITE_OTHER): Payer: Self-pay

## 2020-04-26 ENCOUNTER — Other Ambulatory Visit: Payer: Self-pay

## 2020-04-26 DIAGNOSIS — O00202 Left ovarian pregnancy without intrauterine pregnancy: Secondary | ICD-10-CM

## 2020-04-26 LAB — BETA HCG QUANT (REF LAB): hCG Quant: 825 m[IU]/mL

## 2020-04-26 NOTE — Progress Notes (Signed)
Pt here today for STAT beta lab s/p day 7 MTX tx.  Pt denies vaginal bleeding but is having some mild pain in her umbilicus.  I advised pt that she can go to Urgent Care to have it evaluated.  I informed pt that we will call her with results and f/u in approximately two hours with interpreter.  Pt verbalized understanding.   Addison Naegeli, RN   Received notification from [facility] that pt's beta results are 825.  Notified Dr. Earlene Plater the results and provider recommendation to have patient go to MAU for second dose of MTX due to inappropriate decrease in beta levels s/p MTX tx.    Called pt with Pacifiic Interpreter # [phone] and L/M that I am calling with results if she could please call the office for results and f/u.  MAU charge nurse notified.   Addison Naegeli, RN

## 2020-04-27 ENCOUNTER — Telehealth (INDEPENDENT_AMBULATORY_CARE_PROVIDER_SITE_OTHER): Payer: Self-pay | Admitting: Lactation Services

## 2020-04-27 ENCOUNTER — Inpatient Hospital Stay (HOSPITAL_COMMUNITY): Payer: Self-pay

## 2020-04-27 ENCOUNTER — Other Ambulatory Visit: Payer: Self-pay

## 2020-04-27 ENCOUNTER — Inpatient Hospital Stay (HOSPITAL_COMMUNITY)
Admission: AD | Admit: 2020-04-27 | Discharge: 2020-04-27 | Disposition: A | Discharge status: Home / Self Care | Payer: Self-pay | Attending: Obstetrics & Gynecology | Admitting: Obstetrics & Gynecology

## 2020-04-27 DIAGNOSIS — O3680X0 Pregnancy with inconclusive fetal viability, not applicable or unspecified: Secondary | ICD-10-CM

## 2020-04-27 DIAGNOSIS — O26891 Other specified pregnancy related conditions, first trimester: Secondary | ICD-10-CM | POA: Insufficient documentation

## 2020-04-27 DIAGNOSIS — O26899 Other specified pregnancy related conditions, unspecified trimester: Secondary | ICD-10-CM

## 2020-04-27 DIAGNOSIS — O0281 Inappropriate change in quantitative human chorionic gonadotropin (hCG) in early pregnancy: Secondary | ICD-10-CM

## 2020-04-27 DIAGNOSIS — R102 Pelvic and perineal pain: Secondary | ICD-10-CM | POA: Insufficient documentation

## 2020-04-27 DIAGNOSIS — O009 Unspecified ectopic pregnancy without intrauterine pregnancy: Secondary | ICD-10-CM | POA: Insufficient documentation

## 2020-04-27 DIAGNOSIS — Z3A01 Less than 8 weeks gestation of pregnancy: Secondary | ICD-10-CM | POA: Insufficient documentation

## 2020-04-27 LAB — COMPREHENSIVE METABOLIC PANEL
ALT: 38 U/L (ref 0–44)
AST: 27 U/L (ref 15–41)
Albumin: 3.5 g/dL (ref 3.5–5.0)
Alkaline Phosphatase: 94 U/L (ref 38–126)
Anion gap: 6 (ref 5–15)
BUN: 8 mg/dL (ref 6–20)
CO2: 27 mmol/L (ref 22–32)
Calcium: 9 mg/dL (ref 8.9–10.3)
Chloride: 105 mmol/L (ref 98–111)
Creatinine, Ser: 0.72 mg/dL (ref 0.44–1.00)
GFR calc Af Amer: >60 mL/min (ref >60)
GFR calc non Af Amer: >60 mL/min (ref >60)
Glucose, Bld: 127 mg/dL — ABNORMAL HIGH (ref 70–99)
Potassium: 3.6 mmol/L (ref 3.5–5.1)
Sodium: 138 mmol/L (ref 135–145)
Total Bilirubin: 0.3 mg/dL (ref 0.3–1.2)
Total Protein: 6.9 g/dL (ref 6.5–8.1)

## 2020-04-27 LAB — CBC
HCT: 40.2 % (ref 36.0–46.0)
Hemoglobin: 12.9 g/dL (ref 12.0–15.0)
MCH: 28.7 pg (ref 26.0–34.0)
MCHC: 32.1 g/dL (ref 30.0–36.0)
MCV: 89.5 fL (ref 80.0–100.0)
Platelets: 352 10*3/uL (ref 150–400)
RBC: 4.49 MIL/uL (ref 3.87–5.11)
RDW: 13.4 % (ref 11.5–15.5)
WBC: 8.2 10*3/uL (ref 4.0–10.5)
nRBC: 0 % (ref 0.0–0.2)

## 2020-04-27 MED ORDER — METHOTREXATE FOR ECTOPIC PREGNANCY
50.0000 mg/m2 | Freq: Once | INTRAMUSCULAR | Status: AC
  Administered 2020-04-27: 120 mg via INTRAMUSCULAR
  Filled 2020-04-27: qty 1

## 2020-04-27 NOTE — Discharge Instructions (Signed)
Embarazo ectópico  Ectopic Pregnancy    Un embarazo ectópico ocurre cuando un óvulo fecundado se desarrolla fuera del útero. El óvulo fecundado no puede sobrevivir fuera del útero. Este problema generalmente ocurre en las trompas de [place]. La causa suele ser un daño en la trompa de [place].  Si el problema se detecta a tiempo, puede tratarse con medicamentos que detienen el desarrollo del óvulo. Si la trompa se fisura o estalla (hay ruptura), tendrá una hemorragia interna. Con frecuencia, se siente un dolor muy intenso en la parte baja del vientre. Esto es una emergencia. Deberá someterse a una cirugía. Solicite ayuda de inmediato.  Siga estas indicaciones en su casa:  Después de recibir tratamiento con medicamentos o cirugía:  · Descanse y limite sus actividades durante el tiempo que le indique el médico.  · Siga estas indicaciones hasta que su médico le diga que es seguro dejar de hacerlo:  ? No levante ningún objeto que pese más de 10 libras (4,5 kg) o más del límite de peso que su médico le indique que puede levantar.  ? Evite hacer ejercicios y cualquier movimiento que requieran mucho esfuerzo.  · Para prevenir problemas al ir de cuerpo (estreñimiento):  ? Siga una dieta saludable. Esto incluye lo siguiente:  § Frutas.  § Vegetales.  § Cereales integrales.  ? Beba entre 6 y 8 vasos de agua por día.  Comuníquese con un médico si:  Solicite ayuda de inmediato si:  · Siente un dolor muy intenso y repentino en el vientre.  · Tiene un dolor muy intenso en los hombros o en el cuello.  · Siente que el dolor empeora y no se alivia con los medicamentos.  · Tiene los siguientes síntomas:  ? Fiebre o escalofríos.  ? Hemorragia vaginal.  ? Enrojecimiento o hinchazón en el lugar del corte quirúrgico (incisión).  · Tiene malestar estomacal (náuseas) o vomita.  · Se siente mareada o débil.  · Se siente mareada o se desvanece (se desmaya).  Resumen  · Un embarazo ectópico ocurre cuando un óvulo fecundado se desarrolla fuera  del útero.  · Si el problema se detecta a tiempo, puede tratarse con medicamentos que detienen el desarrollo del óvulo.  · Si la trompa se fisura o estalla (hay ruptura), necesitará cirugía. Esto es una emergencia. Solicite ayuda de inmediato.  Esta información no tiene como fin reemplazar el consejo del médico. Asegúrese de hacerle al médico cualquier pregunta que tenga.  Document Revised: 06/09/2017 Document Reviewed: 06/09/2017  Elsevier Patient Education © 2020 [employer]

## 2020-04-27 NOTE — MAU Provider Note (Signed)
Spanish interpreter used for this encounter.   Subjective:  Ashley Grant is a 39 y.o. G2P1001 at [redacted]w[redacted]d who presents to MAU for 2nd administration of MTX. She was called by the [facility] office today and notified that her levels are not declining; she was asked to come to MAU.    6/1: 116 6/1: 156 6/3: 246 6/8: 421 6/10: 610 - MTX dose 1 6/13: 770-  MTX Day 4 6/16: 825 6/17: MTX dose 2   She was last seen on 6/13 .She denies vaginal bleeding. She reports abdominal pain at her umbilicus.   Objective:  Physical Exam  Nursing note and vitals reviewed. Constitutional: She is oriented to person, place, and time. She appears well-developed and well-nourished. No distress.  HENT:  Head: Normocephalic.  Cardiovascular: Normal rate.  Respiratory: Effort normal.  GI: Soft. Tenderness at umbilicus with deep palpation likely 2/2 hernia. Easily reproducible.  Neurological: She is alert and oriented to person, place, and time. Skin: Skin is warm and dry.  Psychiatric: She has a normal mood and affect.   Results for orders placed or performed during the hospital encounter of 04/27/20 (from the past 24 hour(s))  Comprehensive metabolic panel     Status: Abnormal   Collection Time: 04/27/20  1:02 PM  Result Value Ref Range   Sodium 138 135 - 145 mmol/L   Potassium 3.6 3.5 - 5.1 mmol/L   Chloride 105 98 - 111 mmol/L   CO2 27 22 - 32 mmol/L   Glucose, Bld 127 (H) 70 - 99 mg/dL   BUN 8 6 - 20 mg/dL   Creatinine, Ser 0.93 0.44 - 1.00 mg/dL   Calcium 9.0 8.9 - 26.7 mg/dL   Total Protein 6.9 6.5 - 8.1 g/dL   Albumin 3.5 3.5 - 5.0 g/dL   AST 27 15 - 41 U/L   ALT 38 0 - 44 U/L   Alkaline Phosphatase 94 38 - 126 U/L   Total Bilirubin 0.3 0.3 - 1.2 mg/dL   GFR calc non Af Amer >60 >60 mL/min   GFR calc Af Amer >60 >60 mL/min   Anion gap 6 5 - 15  CBC     Status: None   Collection Time: 04/27/20  1:02 PM  Result Value Ref Range   WBC 8.2 4.0 - 10.5 K/uL   RBC 4.49 3.87 - 5.11 MIL/uL    Hemoglobin 12.9 12.0 - 15.0 g/dL   HCT 12.4 36 - 46 %   MCV 89.5 80.0 - 100.0 fL   MCH 28.7 26.0 - 34.0 pg   MCHC 32.1 30.0 - 36.0 g/dL   RDW 58.0 99.8 - 33.8 %   Platelets 352 150 - 400 K/uL   nRBC 0.0 0.0 - 0.2 %    Assessment/Plan: Pregnancy of unknown location MTX dose 2 given today. O positive blood type  FU Sunday in MAU for Day 4 labs. Info written on Hcg book in MAU Strict return precautions Will need to see PCP for hernia.     Duane Lope, NP  04/27/2020 5:48 PM

## 2020-04-27 NOTE — Telephone Encounter (Signed)
Called patient back after she called into the office. Called patient with assistance of [employer], [employer], trade [employer].   Patient was informed to go to MAU for 2nd dosage of Methotrexate today. Patient voiced understanding.

## 2020-04-27 NOTE — Telephone Encounter (Signed)
Called patient with [employer] for interpreter, no answer- left message to call the office back immediately. Patient has inappropriate rise in bhcg levels and needs to return to MAU for 2nd dose of MTX.

## 2020-04-27 NOTE — MAU Note (Signed)
Sent over for 2nd dose of MTX, hormone levels have not dropped at all.  Pt denies any cramping or bleeding. Reports pain at umbilicus, "popped out".  Noted since beginning of preg, thought it was related, but seems to be gettting worse.  Hard to sleep, can't walk right (when she walks "it feels like something is going to explode".Marland Kitchen

## 2020-04-30 ENCOUNTER — Inpatient Hospital Stay (HOSPITAL_COMMUNITY)
Admission: AD | Admit: 2020-04-30 | Discharge: 2020-04-30 | Disposition: A | Discharge status: Home / Self Care | Payer: Self-pay | Attending: Obstetrics and Gynecology | Admitting: Obstetrics and Gynecology

## 2020-04-30 ENCOUNTER — Other Ambulatory Visit: Payer: Self-pay

## 2020-04-30 DIAGNOSIS — O209 Hemorrhage in early pregnancy, unspecified: Secondary | ICD-10-CM | POA: Insufficient documentation

## 2020-04-30 DIAGNOSIS — Z3A01 Less than 8 weeks gestation of pregnancy: Secondary | ICD-10-CM | POA: Insufficient documentation

## 2020-04-30 DIAGNOSIS — O09521 Supervision of elderly multigravida, first trimester: Secondary | ICD-10-CM | POA: Insufficient documentation

## 2020-04-30 DIAGNOSIS — O3680X0 Pregnancy with inconclusive fetal viability, not applicable or unspecified: Secondary | ICD-10-CM | POA: Insufficient documentation

## 2020-04-30 DIAGNOSIS — O26891 Other specified pregnancy related conditions, first trimester: Secondary | ICD-10-CM | POA: Insufficient documentation

## 2020-04-30 LAB — HCG, QUANTITATIVE, PREGNANCY: hCG, Beta Chain, Quant, S: 806 m[IU]/mL — ABNORMAL HIGH (ref <5)

## 2020-04-30 NOTE — MAU Note (Signed)
Ashley Grant is a 39 y.o. at [redacted]w[redacted]d here in MAU reporting: here for day 4 labs post 2nd dose of MTX. No pain. Has been having some spotting.  Pain score: 0/10  Vitals:   04/30/20 1253  BP: 113/68  Pulse: 92  Resp: 16  Temp: 98.4 F (36.9 C)  SpO2: 98%     Lab orders placed from triage: hcg

## 2020-04-30 NOTE — MAU Provider Note (Signed)
History   Chief Complaint:  Follow-up   Ashley Grant is  39 y.o. G2P1001 Patient's last menstrual period was 04/03/2020 (exact date).. Patient is here for follow up of quantitative HCG and ongoing surveillance of pregnancy status.   She is [redacted]w[redacted]d weeks gestation  by LMP.   Interpreter present for the entire visit.  Since her last visit, the patient is without new complaint.   The patient reports bleeding as  none now.    General ROS:  Some periodic vaginal bleeding. No abdominal or back pain.  Symptoms are less than they were previously.  No nausea or vomiting.  Her previous Quantitative HCG values are: 6/1: 116 6/1: 156 6/3: 246 6/8: 421 6/10: 610 - MTX dose 1 6/13: 770-  MTX Day 4 6/16: 825 6/17: MTX dose 2   Physical Exam   Blood pressure 113/68, pulse 92, temperature 98.4 F (36.9 C), temperature source Oral, resp. rate 16, last menstrual period 04/03/2020, SpO2 98 %.   Labs: Results for orders placed or performed during the hospital encounter of 04/30/20 (from the past 24 hour(s))  hCG, quantitative, pregnancy   Collection Time: 04/30/20  1:09 PM  Result Value Ref Range   hCG, Beta Chain, Quant, S 806 (H) <5 mIU/mL    Ultrasound Studies:   US OB Transvaginal  Result Date: 04/27/2020 CLINICAL DATA:  Pelvic pain.  Reported methotrexate therapy EXAM: TRANSVAGINAL OB ULTRASOUND TECHNIQUE: Transvaginal ultrasound was performed for complete evaluation of the gestation as well as the maternal uterus, adnexal regions, and pelvic cul-de-sac. COMPARISON:  April 11, 2020 FINDINGS: Intrauterine gestational sac: Not visualized Yolk sac:  Not visualized Embryo:  Not visualized Cardiac Activity: Not visualized Subchorionic hemorrhage:  None visualized. Maternal uterus/adnexae: Cervical os closed. Endometrium measures 7 mm in thickness. Right ovary measures 3.8 x 2.7 x 3.2 cm. Left ovary measures 2.1 x 2.1 x 2.0 cm. No extrauterine pelvic or adnexal mass. No evident free fluid.  IMPRESSION: No intrauterine mass or gestation evident. If beta hCG remains positive, differential considerations would include intrauterine gestation too early to be seen by either transabdominal transvaginal technique; recent spontaneous abortion; ectopic gestation. Close clinical and laboratory assessment remain advised. Timing of repeat ultrasound will depend on beta HCG values going forward. Study otherwise unremarkable. No extrauterine pelvic mass or fluid in particular. Electronically Signed   By: Bretta Bang III M.D.   On: 04/27/2020 13:58   US OB LESS THAN 14 WEEKS WITH OB TRANSVAGINAL  Result Date: 04/11/2020 CLINICAL DATA:  Bleeding, abdominal pain EXAM: OBSTETRIC <14 WK [place] AND TRANSVAGINAL OB US TECHNIQUE: Both transabdominal and transvaginal ultrasound examinations were performed for complete evaluation of the gestation as well as the maternal uterus, adnexal regions, and pelvic cul-de-sac. Transvaginal technique was performed to assess early pregnancy. COMPARISON:  None. FINDINGS: Intrauterine gestational sac: None Yolk sac:  Not visualized Embryo:  Not visualized Cardiac Activity: Not visualized Heart Rate:   Bpm MSD:   mm    w     d CRL:    mm    w    d                  [place] EDC: Subchorionic hemorrhage:  None visualized. Maternal uterus/adnexae: No adnexal mass or free fluid. IMPRESSION: No intrauterine pregnancy visualized. Differential considerations would include early intrauterine pregnancy too early to visualize, spontaneous abortion, or occult ectopic pregnancy. Recommend close clinical followup and serial quantitative beta HCGs and ultrasounds. Electronically Signed   By: Caryn Bee  Dover M.D.   On: 04/11/2020 21:29    Assessment:  [redacted]w[redacted]d weeks gestation - Pregnancy of unknown anatomic location Follow up from second dose of methotrexate - today is Day 4 from methotrexate.   Plan: The patient is instructed to follow up in in the next few days.  Appointment made for repeat quant at  [facility] as the 7 day quant needed.   Today;s result is less than the last quant on 04/26/20.   Client did not stay to wait on results.  Client called and message left for her that today's results are fine and to follow up with the scheduled appointment on Wed 05-03-20 for repeat quant. Reviewed ectopic precautions. Advised she will also need weekly followup until quant level returns to zero.  Kaiser Belluomini L Jiyan Walkowski 04/30/2020, 1:08 PM

## 2020-05-02 MED FILL — metFORMIN HCL 1000 MG TABS: 1000 | 30 days supply | Qty: 60 | Fill #1

## 2020-05-03 ENCOUNTER — Other Ambulatory Visit: Payer: Self-pay

## 2020-05-03 ENCOUNTER — Ambulatory Visit (INDEPENDENT_AMBULATORY_CARE_PROVIDER_SITE_OTHER): Payer: Self-pay | Admitting: General Practice

## 2020-05-03 DIAGNOSIS — O3680X0 Pregnancy with inconclusive fetal viability, not applicable or unspecified: Secondary | ICD-10-CM

## 2020-05-03 DIAGNOSIS — Z3A01 Less than 8 weeks gestation of pregnancy: Secondary | ICD-10-CM

## 2020-05-03 LAB — BETA HCG QUANT (REF LAB): hCG Quant: 587 m[IU]/mL

## 2020-05-03 NOTE — Progress Notes (Signed)
Patient presents to office today for stat bhcg #7 labs post 2nd dose of MTX on 6/17. Patient reports some spotting and occasional mild cramps. Discussed with patient bhcg process & that I would be calling her in approximately 2 hours with results. Patient verbalized understanding & provided callback number [phone].  Reviewed results with Dr Earlene Plater who finds appropriate decrease in bhcg levels, patient should have follow up bhcg in 1 week.   Called patient & informed her of results. Discussed follow up bhcg in 1 week. Patient verbalized understanding & states she will come 6/30 @ 130. Patient had no questions.  Chase Caller RN BSN 05/03/20

## 2020-05-04 NOTE — Progress Notes (Signed)
I have reviewed this chart and agree with the RN/CMA assessment and management.    K. Meryl Jaydence Vanyo, M.D. Attending [facility] (Faculty Practice)   

## 2020-05-09 ENCOUNTER — Ambulatory Visit: Payer: Self-pay | Admitting: Nurse Practitioner

## 2020-05-10 ENCOUNTER — Other Ambulatory Visit: Payer: Self-pay

## 2020-05-10 DIAGNOSIS — O3680X0 Pregnancy with inconclusive fetal viability, not applicable or unspecified: Secondary | ICD-10-CM

## 2020-05-11 ENCOUNTER — Telehealth: Payer: Self-pay

## 2020-05-11 LAB — BETA HCG QUANT (REF LAB): hCG Quant: 99 m[IU]/mL

## 2020-05-11 NOTE — Telephone Encounter (Addendum)
-----   Message from Conan Bowens, MD sent at 05/11/2020  4:47 PM EDT ----- HCG decreasing after MTX, repeat 1 week non-tstat  Notified pt that her beta results are 99 and the provider would like for her to come in one week for a non-stat beta.  Pt states that she will be able to come in on 05/17/20 @ 1330.  Front office notified.   Addison Naegeli, RN  05/11/20

## 2020-05-17 ENCOUNTER — Other Ambulatory Visit: Payer: Self-pay

## 2020-05-17 DIAGNOSIS — O3680X0 Pregnancy with inconclusive fetal viability, not applicable or unspecified: Secondary | ICD-10-CM

## 2020-05-18 LAB — BETA HCG QUANT (REF LAB): hCG Quant: 12 m[IU]/mL

## 2020-05-19 ENCOUNTER — Telehealth (INDEPENDENT_AMBULATORY_CARE_PROVIDER_SITE_OTHER): Payer: Self-pay | Admitting: Lactation Services

## 2020-05-19 DIAGNOSIS — O039 Complete or unspecified spontaneous abortion without complication: Secondary | ICD-10-CM

## 2020-05-19 NOTE — Telephone Encounter (Signed)
-----   Message from Conan Bowens, MD sent at 05/19/2020  8:58 AM EDT ----- HCG decreasing s/p MTX, needs repeat 1 week non stat, likely will be negative by next week.

## 2020-05-19 NOTE — Telephone Encounter (Signed)
Called patient with assistance of [facility] # [phone].   Patient was informed that her Hcg level is 12 and needs repeat HCG in 1 week.   Patient would like to come in next Wednesday 7/14 at 1:30. Front office notified.

## 2020-05-24 ENCOUNTER — Encounter: Payer: Self-pay | Admitting: Physician Assistant

## 2020-05-24 ENCOUNTER — Other Ambulatory Visit: Payer: Self-pay

## 2020-05-24 ENCOUNTER — Ambulatory Visit: Payer: Self-pay | Attending: Physician Assistant | Admitting: Physician Assistant

## 2020-05-24 VITALS — BP 121/83 | HR 92 | Temp 98.8°F | Resp 16 | Wt 279.2 lb

## 2020-05-24 DIAGNOSIS — Z789 Other specified health status: Secondary | ICD-10-CM

## 2020-05-24 DIAGNOSIS — R739 Hyperglycemia, unspecified: Secondary | ICD-10-CM

## 2020-05-24 DIAGNOSIS — R109 Unspecified abdominal pain: Secondary | ICD-10-CM

## 2020-05-24 DIAGNOSIS — K429 Umbilical hernia without obstruction or gangrene: Secondary | ICD-10-CM

## 2020-05-24 DIAGNOSIS — M549 Dorsalgia, unspecified: Secondary | ICD-10-CM

## 2020-05-24 DIAGNOSIS — Z3490 Encounter for supervision of normal pregnancy, unspecified, unspecified trimester: Secondary | ICD-10-CM

## 2020-05-24 DIAGNOSIS — R1033 Periumbilical pain: Secondary | ICD-10-CM

## 2020-05-24 MED ORDER — NAPROXEN 500 MG PO TABS: 500.0000 mg | ORAL_TABLET | Freq: Two times a day (BID) | ORAL | 0 refills | Status: DC

## 2020-05-24 MED FILL — ?NAPROXEN 500 MG TABS: 500 | 30 days supply | Qty: 60 | Fill #0

## 2020-05-24 NOTE — Progress Notes (Signed)
Patient ID: Ashley Grant, female   DOB: February 09, 1981, 38 y.o.   MRN: [id number]   Ashley Grant, is a 39 y.o. female  [id number]  FGH:[id number]  DOB - 1981/11/07  Subjective:  Chief Complaint and HPI: Ashley Grant is a 39 y.o. female here today with 1 month h/o umbilical inflammation.  Recent ectopic pregnancy with MTX treatment and consistently declining bHCG levels.  Last level last week=12. No N/V/D or BM changes.  She has been having some lower back and pelvic cramping.  Appetite is good.     ROS:   Constitutional:  No f/c, No night sweats, No unexplained weight loss. EENT:  No vision changes, No blurry vision, No hearing changes. No mouth, throat, or ear problems.  Respiratory: No cough, No SOB Cardiac: No CP, no palpitations GI:  + abd pain, No N/V/D. GU: No Urinary s/sx Musculoskeletal: No joint pain Neuro: No headache, no dizziness, no motor weakness.  Skin: No rash Endocrine:  No polydipsia. No polyuria.  Psych: Denies SI/HI  No problems updated.  ALLERGIES: No Known Allergies  PAST MEDICAL HISTORY: Past Medical History:  Diagnosis Date   Obesity (BMI 30-39.9)    PCOS (polycystic ovarian syndrome)     MEDICATIONS AT HOME: Prior to Admission medications   Medication Sig Start Date End Date Taking? Authorizing Provider  naproxen (NAPROSYN) 500 MG tablet Take 1 tablet (500 mg total) by mouth 2 (two) times daily with a meal. Prn pain 05/24/20   Anders Simmonds, PA-C     Objective:  EXAM:   Vitals:   05/24/20 1347  BP: 121/83  Pulse: 92  Resp: 16  Temp: 98.8 F (37.1 C)  SpO2: 97%  Weight: 279 lb 3.2 oz (126.6 kg)    General appearance : A&OX3. NAD. Non-toxic-appearing HEENT: Atraumatic and Normocephalic.  PERRLA. EOM intact.   Chest/Lungs:  Breathing-non-labored, Good air entry bilaterally, breath sounds normal without rales, rhonchi, or wheezing  CVS: S1 S2 regular, no murmurs, gallops, rubs  Abdomen: Bowel sounds  present, Non tender and not distended with no gaurding, rigidity or rebound.  Mild umbilical hernia.  No skin changes Extremities: Bilateral Lower Ext shows no edema, both legs are warm to touch with = pulse throughout Neurology:  CN II-XII grossly intact, Non focal.   Psych:  TP linear. J/I WNL. Normal speech. Appropriate eye contact and affect.  Skin:  No Rash  Data Review Lab Results  Component Value Date   HGBA1C 5.9 12/03/2019   HGBA1C 6.0 (H) 10/22/2019   HGBA1C 5.6 08/14/2018     Assessment & Plan   1. Umbilical pain - Ambulatory referral to General Surgery  2. Pregnancy, unspecified gestational age-patietn with recent ectopic preg and was supposed to get repeat hcg done today at gyn office.  No abnormal bleeding.  - Beta hCG quant (ref lab)  3. Umbilical hernia without obstruction and without gangrene - CBC with Differential/Platelet - Ambulatory referral to General Surgery  4. Abdominal pain, unspecified abdominal location-likely due to hernia.  Non-acute abdomen - POCT URINALYSIS DIP (CLINITEK) - H. pylori breath test CMP essentially unremarkable 1 month ago  5. Acute midline back pain, unspecified back location - POCT URINALYSIS DIP (CLINITEK) - naproxen (NAPROSYN) 500 MG tablet; Take 1 tablet (500 mg total) by mouth 2 (two) times daily with a meal. Prn pain  Dispense: 60 tablet; Refill: 0  6. Hyperglycemia - Hemoglobin A1c I have had a lengthy discussion and provided education about insulin resistance and the intake of too  much sugar/refined carbohydrates.  I have advised the patient to work at a goal of eliminating sugary drinks, candy, desserts, sweets, refined sugars, processed foods, and white carbohydrates.  The patient expresses understanding.   7.  Language barrier-stratus interpreters used and additional time performing visit was required.  "Toniann Fail"   Patient have been counseled extensively about nutrition and exercise  Return in about 3 months (around  08/24/2020) for PCP.  The patient was given clear instructions to go to ER or return to medical center if symptoms don't improve, worsen or new problems develop. The patient verbalized understanding. The patient was told to call to get lab results if they haven't heard anything in the next week.     Georgian Co, PA-C [facility] [place], [place] [phone]   05/24/2020, 2:21 PM

## 2020-05-25 ENCOUNTER — Ambulatory Visit: Payer: Self-pay | Attending: Family Medicine

## 2020-05-25 LAB — POCT URINALYSIS DIP (CLINITEK)
Bilirubin, UA: NEGATIVE
Glucose, UA: NEGATIVE mg/dL
Ketones, POC UA: NEGATIVE mg/dL
Leukocytes, UA: NEGATIVE
Nitrite, UA: NEGATIVE
POC PROTEIN,UA: NEGATIVE
Spec Grav, UA: 1.025 (ref 1.010–1.025)
Urobilinogen, UA: 0.2 E.U./dL
pH, UA: 6 (ref 5.0–8.0)

## 2020-05-26 LAB — CBC WITH DIFFERENTIAL/PLATELET
Basophils Absolute: 0.1 10*3/uL (ref 0.0–0.2)
Basos: 1 %
EOS (ABSOLUTE): 0.1 10*3/uL (ref 0.0–0.4)
Eos: 1 %
Hematocrit: 41.4 % (ref 34.0–46.6)
Hemoglobin: 13.6 g/dL (ref 11.1–15.9)
Immature Grans (Abs): 0 10*3/uL (ref 0.0–0.1)
Immature Granulocytes: 0 %
Lymphocytes Absolute: 3 10*3/uL (ref 0.7–3.1)
Lymphs: 35 %
MCH: 28.9 pg (ref 26.6–33.0)
MCHC: 32.9 g/dL (ref 31.5–35.7)
MCV: 88 fL (ref 79–97)
Monocytes Absolute: 0.6 10*3/uL (ref 0.1–0.9)
Monocytes: 7 %
Neutrophils Absolute: 4.8 10*3/uL (ref 1.4–7.0)
Neutrophils: 56 %
Platelets: 371 10*3/uL (ref 150–450)
RBC: 4.7 x10E6/uL (ref 3.77–5.28)
RDW: 13.3 % (ref 11.7–15.4)
WBC: 8.5 10*3/uL (ref 3.4–10.8)

## 2020-05-26 LAB — HEMOGLOBIN A1C
Est. average glucose Bld gHb Est-mCnc: 117 mg/dL
Hgb A1c MFr Bld: 5.7 % — ABNORMAL HIGH (ref 4.8–5.6)

## 2020-05-26 LAB — BETA HCG QUANT (REF LAB): hCG Quant: 2 m[IU]/mL

## 2020-05-27 LAB — H. PYLORI BREATH TEST: H pylori Breath Test: NEGATIVE

## 2020-05-31 ENCOUNTER — Other Ambulatory Visit: Payer: Self-pay

## 2020-05-31 ENCOUNTER — Ambulatory Visit: Payer: Self-pay | Attending: Family Medicine

## 2020-06-13 ENCOUNTER — Telehealth: Payer: Self-pay | Admitting: Family Medicine

## 2020-06-13 NOTE — Telephone Encounter (Signed)
Pt was sent a letter from financial dept. Inform them, that the application they submitted was incomplete, since they were missing some documentation at the time of the appointment, Pt need to reschedule and resubmit all new papers and application for CAFA and [place], P.S. old documents has been sent back by mail to the Pt and Pt. need to make a new appt. 

## 2020-08-10 ENCOUNTER — Telehealth: Payer: Self-pay | Admitting: Family Medicine

## 2020-08-10 MED FILL — metFORMIN HCL 1000 MG TABS: 1000 | 30 days supply | Qty: 60 | Fill #2

## 2020-08-10 NOTE — Telephone Encounter (Signed)
Patient came in saying that she thought her PCP had advised her to take Vitamin D medication. Patient states she went to the pharmacy and was informed that there is no Rx for her. Patient would like to know if she still needs to take Vitamin D.

## 2020-08-10 NOTE — Telephone Encounter (Signed)
Called pt via [employer] [place], [phone]) and informed pt that per chart review vitamin D script was d/c at hosp d/c, pt stated that she has been taking OTC and will just continue that.

## 2020-08-11 ENCOUNTER — Other Ambulatory Visit: Payer: Self-pay | Admitting: Family Medicine

## 2020-08-11 DIAGNOSIS — E559 Vitamin D deficiency, unspecified: Secondary | ICD-10-CM

## 2020-08-11 NOTE — Telephone Encounter (Signed)
Requested medication (s) are due for refill today: yes  Requested medication (s) are on the active medication list: no  Last refill:  01/27/20  Future visit scheduled: No  Notes to clinic:  Not delegated    Requested Prescriptions  Pending Prescriptions Disp Refills   ergocalciferol (VITAMIN D2) 1.25 MG (50000 UT) capsule [Pharmacy Med Name: ERGOCALCIFEROL 50000 UNITC 1.25 MG Capsule] 5 capsule 2    Sig: TAKE 1 CAPSULE (50,000 UNITS TOTAL) BY MOUTH EVERY 7 (SEVEN) DAYS.      Endocrinology:  Vitamins - Vitamin D Supplementation Failed - 08/11/2020  9:18 AM      Failed - 50,000 IU strengths are not delegated      Failed - Phosphate in normal range and within 360 days    No results found for: PHOS        Failed - Vitamin D in normal range and within 360 days    Vit D, 25-Hydroxy  Date Value Ref Range Status  10/22/2019 26.8 (L) 30.0 - 100.0 ng/mL Final    Comment:    Vitamin D deficiency has been defined by the Institute of Medicine and an Endocrine Society practice guideline as a level of serum 25-OH vitamin D less than 20 ng/mL (1,2). The Endocrine Society went on to further define vitamin D insufficiency as a level between 21 and 29 ng/mL (2). 1. IOM (Institute of Medicine). 2010. Dietary reference    intakes for calcium and D. [place]: The    [employer]. 2. Holick MF, Binkley [place], Bischoff-Ferrari HA, et al.    Evaluation, treatment, and prevention of vitamin D    deficiency: an Endocrine Society clinical practice    guideline. JCEM. 2011 Jul; 96(7):1911-30.           Passed - Ca in normal range and within 360 days    Calcium  Date Value Ref Range Status  04/27/2020 9.0 8.9 - 10.3 mg/dL Final          Passed - Valid encounter within last 12 months    Recent Outpatient Visits           2 months ago Umbilical pain   [facility] [place], [place], [place]   8 months ago Prediabetes   [facility] [place], [employer], [place]   9 months ago Fatigue, unspecified type   [facility] [place], [place], [place]

## 2020-09-25 ENCOUNTER — Ambulatory Visit: Payer: Self-pay | Attending: Family Medicine

## 2020-09-25 ENCOUNTER — Other Ambulatory Visit: Payer: Self-pay

## 2020-10-02 ENCOUNTER — Other Ambulatory Visit: Payer: Self-pay

## 2020-10-02 ENCOUNTER — Encounter: Payer: Self-pay | Admitting: Plastic Surgery

## 2020-10-02 ENCOUNTER — Ambulatory Visit (INDEPENDENT_AMBULATORY_CARE_PROVIDER_SITE_OTHER): Payer: Self-pay | Admitting: Plastic Surgery

## 2020-10-02 DIAGNOSIS — Z719 Counseling, unspecified: Secondary | ICD-10-CM | POA: Insufficient documentation

## 2020-10-02 NOTE — Progress Notes (Signed)
Patient ID: Ashley Grant, female    DOB: Apr 02, 1981, 39 y.o.   MRN: [id number]   Chief Complaint  Patient presents with  . Consult    Mammary Hyperplasia: The patient is a 39 y.o. female with a history of mammary hyperplasia for several years.  She has extremely large breasts causing symptoms that include the following: Back pain in the upper and lower back, including neck pain. She pulls or pins her bra straps to provide better lift and relief of the pressure and pain. She notices relief by holding her breast up manually.  Her shoulder straps cause grooves and pain and pressure that requires padding for relief. Pain medication is sometimes required with motrin and tylenol.  Activities that are hindered by enlarged breasts include: exercise and running.  She has tried supportive clothing as well as fitted bras without improvement.  Her breasts are extremely large and fairly symmetric.  She has hyperpigmentation of the inframammary area on both sides.  The sternal to nipple distance on the right is 41 cm and the left is 42 cm.  The IMF distance is 24 cm.  She is 5 feet 5 inches tall and weighs 289 pounds.  Preoperative bra size = 46 DDD cup.  She would like to be a C/D cup.  The estimated excess breast tissue to be removed at the time of surgery = 650 grams on the left and 650 grams on the right.  Mammogram history: None.  Family history of breast cancer: Paternal grandmother.  Tobacco use: None.  No history of diabetes.  She does complain of neck and back pain.    Review of Systems  Constitutional: Negative for activity change and appetite change.  HENT: Negative.   Eyes: Negative.   Respiratory: Negative.  Negative for chest tightness and shortness of breath.   Cardiovascular: Negative for leg swelling.  Gastrointestinal: Negative for abdominal distention.  Endocrine: Negative.   Genitourinary: Negative.   Musculoskeletal: Positive for back pain.  Neurological:  Negative.   Hematological: Negative.     Past Medical History:  Diagnosis Date  . Obesity (BMI 30-39.9)   . PCOS (polycystic ovarian syndrome)     Past Surgical History:  Procedure Laterality Date  . CESAREAN SECTION N/A 04/08/2014   Procedure: CESAREAN SECTION;  Surgeon: Adam Phenix, MD;  Location: [facility];  Service: Obstetrics;  Laterality: N/A;      Current Outpatient Medications:  .  naproxen (NAPROSYN) 500 MG tablet, Take 1 tablet (500 mg total) by mouth 2 (two) times daily with a meal. Prn pain, Disp: 60 tablet, Rfl: 0   Objective:   Vitals:   10/02/20 0946  BP: 130/90  Pulse: 99  Temp: 98.6 F (37 C)  SpO2: 97%    Physical Exam Vitals and nursing note reviewed.  Constitutional:      Appearance: Normal appearance.  HENT:     Head: Normocephalic and atraumatic.  Cardiovascular:     Rate and Rhythm: Normal rate.     Pulses: Normal pulses.  Pulmonary:     Effort: Pulmonary effort is normal.  Abdominal:     General: Abdomen is flat. There is no distension.     Tenderness: There is no abdominal tenderness.  Neurological:     General: No focal deficit present.     Mental Status: She is alert and oriented to person, place, and time.  Psychiatric:        Mood and Affect: Mood normal.  Behavior: Behavior normal.        Thought Content: Thought content normal.     Assessment & Plan:  Encounter for counseling  The patient is a good candidate for bilateral breast reduction with liposuction.  She is also interested in [place].  We talked about the risks and complications of the breast reduction.  Loss of nipple areola sensation and a possible inability to breast-feed in the future we also discussed risk of nipple areola loss although low to still a risk.  The patient would like a quote for bilateral breast reduction with liposuction and Kybella. Pictures were obtained of the patient and placed in the chart with the patient's or guardian's  permission.   Alena Bills Briella Hobday, DO

## 2020-10-03 ENCOUNTER — Telehealth: Payer: Self-pay | Admitting: Plastic Surgery

## 2020-10-03 NOTE — Telephone Encounter (Signed)
Patient called to advise that she was expecting a call for her surgery. I provided the quote, which was put in her MyChart, and advised that we are waiting on additional surgery time to be provided for the new year. The patient said she would think about the quote and call us back.

## 2020-10-18 ENCOUNTER — Encounter: Payer: Self-pay | Admitting: Family Medicine

## 2020-11-08 ENCOUNTER — Telehealth: Payer: Self-pay | Admitting: Plastic Surgery

## 2020-11-08 NOTE — Telephone Encounter (Signed)
Called patient to follow up on breast reduction surgery. Patient indicated that she cannot see the quote in her MyChart, so I verbally gave it to her again. She wants to proceed with the Kybella injections first and then "talk about the breast reduction next year when she can save up some more money."

## 2020-11-14 ENCOUNTER — Other Ambulatory Visit: Payer: Self-pay

## 2020-11-14 ENCOUNTER — Ambulatory Visit: Payer: Self-pay | Attending: Family Medicine | Admitting: Family Medicine

## 2020-11-14 ENCOUNTER — Encounter: Payer: Self-pay | Admitting: Family Medicine

## 2020-11-14 VITALS — BP 132/86 | HR 88 | Ht 65.0 in | Wt 294.6 lb

## 2020-11-14 DIAGNOSIS — Z13228 Encounter for screening for other metabolic disorders: Secondary | ICD-10-CM

## 2020-11-14 DIAGNOSIS — Z1159 Encounter for screening for other viral diseases: Secondary | ICD-10-CM

## 2020-11-14 DIAGNOSIS — Z124 Encounter for screening for malignant neoplasm of cervix: Secondary | ICD-10-CM

## 2020-11-14 DIAGNOSIS — Z0000 Encounter for general adult medical examination without abnormal findings: Secondary | ICD-10-CM

## 2020-11-14 NOTE — Patient Instructions (Signed)
Health Maintenance, Female Adopting a healthy lifestyle and getting preventive care are important in promoting health and wellness. Ask your health care provider about:  The right schedule for you to have regular tests and exams.  Things you can do on your own to prevent diseases and keep yourself healthy. What should I know about diet, weight, and exercise? Eat a healthy diet   Eat a diet that includes plenty of vegetables, fruits, low-fat dairy products, and lean protein.  Do not eat a lot of foods that are high in solid fats, added sugars, or sodium. Maintain a healthy weight Body mass index (BMI) is used to identify weight problems. It estimates body fat based on height and weight. Your health care provider can help determine your BMI and help you achieve or maintain a healthy weight. Get regular exercise Get regular exercise. This is one of the most important things you can do for your health. Most adults should:  Exercise for at least 150 minutes each week. The exercise should increase your heart rate and make you sweat (moderate-intensity exercise).  Do strengthening exercises at least twice a week. This is in addition to the moderate-intensity exercise.  Spend less time sitting. Even light physical activity can be beneficial. Watch cholesterol and blood lipids Have your blood tested for lipids and cholesterol at 40 years of age, then have this test every 5 years. Have your cholesterol levels checked more often if:  Your lipid or cholesterol levels are high.  You are older than 40 years of age.  You are at high risk for heart disease. What should I know about cancer screening? Depending on your health history and family history, you may need to have cancer screening at various ages. This may include screening for:  Breast cancer.  Cervical cancer.  Colorectal cancer.  Skin cancer.  Lung cancer. What should I know about heart disease, diabetes, and high blood  pressure? Blood pressure and heart disease  High blood pressure causes heart disease and increases the risk of stroke. This is more likely to develop in people who have high blood pressure readings, are of African descent, or are overweight.  Have your blood pressure checked: ? Every 3-5 years if you are 18-39 years of age. ? Every year if you are 40 years old or older. Diabetes Have regular diabetes screenings. This checks your fasting blood sugar level. Have the screening done:  Once every three years after age 40 if you are at a normal weight and have a low risk for diabetes.  More often and at a younger age if you are overweight or have a high risk for diabetes. What should I know about preventing infection? Hepatitis B If you have a higher risk for hepatitis B, you should be screened for this virus. Talk with your health care provider to find out if you are at risk for hepatitis B infection. Hepatitis C Testing is recommended for:  Everyone born from 1945 through 1965.  Anyone with known risk factors for hepatitis C. Sexually transmitted infections (STIs)  Get screened for STIs, including gonorrhea and chlamydia, if: ? You are sexually active and are younger than 40 years of age. ? You are older than 40 years of age and your health care provider tells you that you are at risk for this type of infection. ? Your sexual activity has changed since you were last screened, and you are at increased risk for chlamydia or gonorrhea. Ask your health care provider if   you are at risk.  Ask your health care provider about whether you are at high risk for HIV. Your health care provider may recommend a prescription medicine to help prevent HIV infection. If you choose to take medicine to prevent HIV, you should first get tested for HIV. You should then be tested every 3 months for as long as you are taking the medicine. Pregnancy  If you are about to stop having your period (premenopausal) and  you may become pregnant, seek counseling before you get pregnant.  Take 400 to 800 micrograms (mcg) of folic acid every day if you become pregnant.  Ask for birth control (contraception) if you want to prevent pregnancy. Osteoporosis and menopause Osteoporosis is a disease in which the bones lose minerals and strength with aging. This can result in bone fractures. If you are 65 years old or older, or if you are at risk for osteoporosis and fractures, ask your health care provider if you should:  Be screened for bone loss.  Take a calcium or vitamin D supplement to lower your risk of fractures.  Be given hormone replacement therapy (HRT) to treat symptoms of menopause. Follow these instructions at home: Lifestyle  Do not use any products that contain nicotine or tobacco, such as cigarettes, e-cigarettes, and chewing tobacco. If you need help quitting, ask your health care provider.  Do not use street drugs.  Do not share needles.  Ask your health care provider for help if you need support or information about quitting drugs. Alcohol use  Do not drink alcohol if: ? Your health care provider tells you not to drink. ? You are pregnant, may be pregnant, or are planning to become pregnant.  If you drink alcohol: ? Limit how much you use to 0-1 drink a day. ? Limit intake if you are breastfeeding.  Be aware of how much alcohol is in your drink. In the [place] one drink equals one 12 oz bottle of beer (355 mL), one 5 oz glass of wine (148 mL), or one 1 oz glass of hard liquor (44 mL). General instructions  Schedule regular health, dental, and eye exams.  Stay current with your vaccines.  Tell your health care provider if: ? You often feel depressed. ? You have ever been abused or do not feel safe at home. Summary  Adopting a healthy lifestyle and getting preventive care are important in promoting health and wellness.  Follow your health care provider's instructions about healthy  diet, exercising, and getting tested or screened for diseases.  Follow your health care provider's instructions on monitoring your cholesterol and blood pressure. This information is not intended to replace advice given to you by your health care provider. Make sure you discuss any questions you have with your health care provider. Document Revised: 10/21/2018 Document Reviewed: 10/21/2018 Elsevier Patient Education  2020 Elsevier Inc.  

## 2020-11-14 NOTE — Progress Notes (Signed)
States she has a hernia.

## 2020-11-14 NOTE — Progress Notes (Signed)
Subjective:  Patient ID: Ashley Grant, female    DOB: [birth year]  Age: 40 y.o. MRN: [id number]  CC: Annual Exam and Gynecologic Exam   HPI Ashley Grant presents for a complete physical exam. She is due for Pap smear but not due for mammogram.  Has a family history of breast cancer in her paternal grandmother who was diagnosed at the age of 27. Has an umbilical hernia which is sometimes painful. She is unsure if it is preventing her from getting pregnant. Seen recently by plastic surgeon Dr. Elisabeth Cara for evaluation for breast reduction however due to cost patient has decided to put this on hold. Past Medical History:  Diagnosis Date  . Obesity (BMI 30-39.9)   . PCOS (polycystic ovarian syndrome)     Past Surgical History:  Procedure Laterality Date  . CESAREAN SECTION N/A 04/08/2014   Procedure: CESAREAN SECTION;  Surgeon: Woodroe Mode, MD;  Location: [facility];  Service: Obstetrics;  Laterality: N/A;    Family History  Problem Relation Age of Onset  . Diabetes Father   . Diabetes Maternal Grandfather   . Cancer Paternal Grandfather     No Known Allergies  Outpatient Medications Prior to Visit  Medication Sig Dispense Refill  . naproxen (NAPROSYN) 500 MG tablet Take 1 tablet (500 mg total) by mouth 2 (two) times daily with a meal. Prn pain (Patient not taking: Reported on 11/14/2020) 60 tablet 0   No facility-administered medications prior to visit.     ROS Review of Systems  Constitutional: Negative for activity change, appetite change and fatigue.  HENT: Negative for congestion, sinus pressure and sore throat.   Eyes: Negative for visual disturbance.  Respiratory: Negative for cough, chest tightness, shortness of breath and wheezing.   Cardiovascular: Negative for chest pain and palpitations.  Gastrointestinal: Negative for abdominal distention, abdominal pain and constipation.  Endocrine: Negative for polydipsia.  Genitourinary:  Negative for dysuria and frequency.  Musculoskeletal: Negative for arthralgias and back pain.  Skin: Negative for rash.  Neurological: Negative for tremors, light-headedness and numbness.  Hematological: Does not bruise/bleed easily.  Psychiatric/Behavioral: Negative for agitation and behavioral problems.    Objective:  BP 132/86   Pulse 88   Ht 5' 5"  (1.651 m)   Wt 294 lb 9.6 oz (133.6 kg)   LMP 04/03/2020 (Exact Date)   SpO2 98%   BMI 49.02 kg/m   BP/Weight 11/14/2020 10/02/2020 2/35/3614  Systolic BP 431 540 086  Diastolic BP 86 90 83  Wt. (Lbs) 294.6 289.2 279.2  BMI 49.02 48.13 46.46      Physical Exam Constitutional:      General: She is not in acute distress.    Appearance: She is well-developed and well-nourished. She is not diaphoretic.  HENT:     Head: Normocephalic.     Right Ear: External ear normal.     Left Ear: External ear normal.     Nose: Nose normal.     Mouth/Throat:     Mouth: Oropharynx is clear and moist.  Eyes:     Extraocular Movements: EOM normal.     Conjunctiva/sclera: Conjunctivae normal.     Pupils: Pupils are equal, round, and reactive to light.  Neck:     Vascular: No JVD.  Cardiovascular:     Rate and Rhythm: Normal rate and regular rhythm.     Pulses: Intact distal pulses.     Heart sounds: Normal heart sounds. No murmur heard. No gallop.  Pulmonary:     Effort: Pulmonary effort is normal. No respiratory distress.     Breath sounds: Normal breath sounds. No wheezing or rales.  Chest:     Chest wall: No tenderness.  Breasts:     Right: No mass or tenderness.     Left: No mass or tenderness.    Abdominal:     General: Bowel sounds are normal. There is no distension.     Palpations: Abdomen is soft. There is no mass.     Tenderness: There is no abdominal tenderness.  Genitourinary:    Comments: External genitalia, vagina, cervix, adnexa-normal Musculoskeletal:        General: No tenderness or edema. Normal range of  motion.     Cervical back: Normal range of motion.  Skin:    General: Skin is warm and dry.  Neurological:     Mental Status: She is alert and oriented to person, place, and time.     Deep Tendon Reflexes: Reflexes are normal and symmetric.  Psychiatric:        Mood and Affect: Mood and affect normal.     CMP Latest Ref Rng & Units 04/27/2020 04/20/2020 04/18/2020  Glucose 70 - 99 mg/dL 127(H) 102(H) 121(H)  BUN 6 - 20 mg/dL 8 9 10   Creatinine 0.44 - 1.00 mg/dL 0.72 0.81 0.88  Sodium 135 - 145 mmol/L 138 138 140  Potassium 3.5 - 5.1 mmol/L 3.6 3.7 4.1  Chloride 98 - 111 mmol/L 105 104 103  CO2 22 - 32 mmol/L 27 26 26   Calcium 8.9 - 10.3 mg/dL 9.0 9.4 9.2  Total Protein 6.5 - 8.1 g/dL 6.9 7.3 6.9  Total Bilirubin 0.3 - 1.2 mg/dL 0.3 0.5 0.5  Alkaline Phos 38 - 126 U/L 94 83 96  AST 15 - 41 U/L 27 34 33  ALT 0 - 44 U/L 38 43 43    Lipid Panel  No results found for: CHOL, TRIG, HDL, CHOLHDL, VLDL, LDLCALC, LDLDIRECT  CBC    Component Value Date/Time   WBC 8.5 05/25/2020 0935   WBC 8.2 04/27/2020 1302   RBC 4.70 05/25/2020 0935   RBC 4.49 04/27/2020 1302   HGB 13.6 05/25/2020 0935   HCT 41.4 05/25/2020 0935   PLT 371 05/25/2020 0935   MCV 88 05/25/2020 0935   MCH 28.9 05/25/2020 0935   MCH 28.7 04/27/2020 1302   MCHC 32.9 05/25/2020 0935   MCHC 32.1 04/27/2020 1302   RDW 13.3 05/25/2020 0935   LYMPHSABS 3.0 05/25/2020 0935   MONOABS 350 04/01/2016 1505   EOSABS 0.1 05/25/2020 0935   BASOSABS 0.1 05/25/2020 0935    Lab Results  Component Value Date   HGBA1C 5.7 (H) 05/25/2020    Assessment & Plan:  1. Annual physical exam Counseled on 150 minutes of exercise per week, healthy eating (including decreased daily intake of saturated fats, cholesterol, added sugars, sodium),routine healthcare maintenance.   2. Screening for cervical cancer - Cytology - PAP([place]) - Cervicovaginal ancillary only  3. Screening for metabolic disorder - TSH - T4, free - CBC  with Differential/Platelet - VITAMIN D 25 Hydroxy (Vit-D Deficiency, Fractures) - CMP14+EGFR - Lipid panel  4. Morbid obesity ([place]) Work on reducing portion sizes, avoiding late meals, increasing physical activity  5. Screening for viral disease - HCV RNA quant rflx ultra or genotyp([facility])    No orders of the defined types were placed in this encounter.   Follow-up: Return in about 1 year (around 11/14/2021) for  complete physical exam.       Charlott Rakes, MD, FAAFP. [facility], [place]   11/14/2020, 11:26 AM

## 2020-11-15 LAB — CMP14+EGFR
ALT: 38 IU/L — ABNORMAL HIGH (ref 0–32)
AST: 31 IU/L (ref 0–40)
Albumin/Globulin Ratio: 1.4 (ref 1.2–2.2)
Albumin: 4.2 g/dL (ref 3.8–4.8)
Alkaline Phosphatase: 129 IU/L — ABNORMAL HIGH (ref 44–121)
BUN/Creatinine Ratio: 14 (ref 9–23)
BUN: 10 mg/dL (ref 6–20)
Bilirubin Total: 0.2 mg/dL (ref 0.0–1.2)
CO2: 22 mmol/L (ref 20–29)
Calcium: 9.3 mg/dL (ref 8.7–10.2)
Chloride: 100 mmol/L (ref 96–106)
Creatinine, Ser: 0.69 mg/dL (ref 0.57–1.00)
GFR calc Af Amer: 127 mL/min/{1.73_m2} (ref >59)
GFR calc non Af Amer: 110 mL/min/{1.73_m2} (ref >59)
Globulin, Total: 3 g/dL (ref 1.5–4.5)
Glucose: 80 mg/dL (ref 65–99)
Potassium: 4.3 mmol/L (ref 3.5–5.2)
Sodium: 138 mmol/L (ref 134–144)
Total Protein: 7.2 g/dL (ref 6.0–8.5)

## 2020-11-15 LAB — CERVICOVAGINAL ANCILLARY ONLY
Bacterial Vaginitis (gardnerella): NEGATIVE
Candida Glabrata: NEGATIVE
Candida Vaginitis: NEGATIVE
Chlamydia: NEGATIVE
Comment: NEGATIVE
Comment: NEGATIVE
Comment: NEGATIVE
Comment: NEGATIVE
Comment: NEGATIVE
Comment: NORMAL
Neisseria Gonorrhea: NEGATIVE
Trichomonas: NEGATIVE

## 2020-11-15 LAB — CBC WITH DIFFERENTIAL/PLATELET
Basophils Absolute: 0 10*3/uL (ref 0.0–0.2)
Basos: 1 %
EOS (ABSOLUTE): 0.1 10*3/uL (ref 0.0–0.4)
Eos: 2 %
Hematocrit: 42.3 % (ref 34.0–46.6)
Hemoglobin: 14.3 g/dL (ref 11.1–15.9)
Immature Grans (Abs): 0 10*3/uL (ref 0.0–0.1)
Immature Granulocytes: 0 %
Lymphocytes Absolute: 2.4 10*3/uL (ref 0.7–3.1)
Lymphs: 38 %
MCH: 29 pg (ref 26.6–33.0)
MCHC: 33.8 g/dL (ref 31.5–35.7)
MCV: 86 fL (ref 79–97)
Monocytes Absolute: 0.4 10*3/uL (ref 0.1–0.9)
Monocytes: 7 %
Neutrophils Absolute: 3.2 10*3/uL (ref 1.4–7.0)
Neutrophils: 52 %
Platelets: 370 10*3/uL (ref 150–450)
RBC: 4.93 x10E6/uL (ref 3.77–5.28)
RDW: 12.4 % (ref 11.7–15.4)
WBC: 6.2 10*3/uL (ref 3.4–10.8)

## 2020-11-15 LAB — HCV RNA QUANT RFLX ULTRA OR GENOTYP: HCV Quant Baseline: NOT DETECTED IU/mL

## 2020-11-15 LAB — T4, FREE: Free T4: 1.19 ng/dL (ref 0.82–1.77)

## 2020-11-15 LAB — TSH: TSH: 1.82 u[IU]/mL (ref 0.450–4.500)

## 2020-11-15 LAB — LIPID PANEL
Chol/HDL Ratio: 6.9 ratio — ABNORMAL HIGH (ref 0.0–4.4)
Cholesterol, Total: 207 mg/dL — ABNORMAL HIGH (ref 100–199)
HDL: 30 mg/dL — ABNORMAL LOW (ref >39)
LDL Chol Calc (NIH): 147 mg/dL — ABNORMAL HIGH (ref 0–99)
Triglycerides: 163 mg/dL — ABNORMAL HIGH (ref 0–149)
VLDL Cholesterol Cal: 30 mg/dL (ref 5–40)

## 2020-11-15 LAB — VITAMIN D 25 HYDROXY (VIT D DEFICIENCY, FRACTURES): Vit D, 25-Hydroxy: 29.4 ng/mL — ABNORMAL LOW (ref 30.0–100.0)

## 2020-11-17 ENCOUNTER — Telehealth: Payer: Self-pay

## 2020-11-17 NOTE — Telephone Encounter (Signed)
Patient name and DOB has been verified Patient was informed of lab results. Patient had no questions.  

## 2020-11-17 NOTE — Telephone Encounter (Signed)
-----   Message from Hoy Register, MD sent at 11/16/2020  2:37 PM EST ----- Thyroid, blood count, kidney and liver functions are stable. Vitamin D is slightly low and she can use OTC vit D 1000 units daily for replacement

## 2020-11-20 ENCOUNTER — Telehealth: Payer: Self-pay | Admitting: Family Medicine

## 2020-11-20 LAB — CYTOLOGY - PAP
Comment: NEGATIVE
Diagnosis: NEGATIVE
High risk HPV: NEGATIVE

## 2020-11-20 NOTE — Telephone Encounter (Signed)
Patient is calling back for lab results. Please advise CB- [phone]

## 2020-11-21 ENCOUNTER — Telehealth: Payer: Self-pay

## 2020-11-21 NOTE — Telephone Encounter (Signed)
Patient name and DOB has been verified Patient was informed of lab results. Patient had no questions.  

## 2020-11-21 NOTE — Telephone Encounter (Signed)
Called pt made aware of MD note results and instructions. Verbalized understanding

## 2020-11-21 NOTE — Telephone Encounter (Signed)
Called pt made aware °

## 2020-11-21 NOTE — Telephone Encounter (Signed)
-----   Message from Hoy Register, MD sent at 11/20/2020  5:50 PM EST ----- PAP smear is negative for malignancy

## 2021-01-02 ENCOUNTER — Ambulatory Visit (INDEPENDENT_AMBULATORY_CARE_PROVIDER_SITE_OTHER): Payer: Self-pay | Admitting: Plastic Surgery

## 2021-01-02 ENCOUNTER — Encounter: Payer: Self-pay | Admitting: Plastic Surgery

## 2021-01-02 ENCOUNTER — Other Ambulatory Visit: Payer: Self-pay

## 2021-01-02 VITALS — BP 147/99 | HR 109

## 2021-01-02 DIAGNOSIS — Z719 Counseling, unspecified: Secondary | ICD-10-CM

## 2021-01-02 NOTE — Progress Notes (Signed)
Kybella Procedure Note  Procedure: Cosmetic deoxycholic acid injection (2 vials were used)  Pre-operative Diagnosis: Submental fullness  Post-operative Diagnosis: Same  Complications:  None  Brief history: The patient desires improvement in the appearance of moderate to severe convexity or fullness associated with submental fat. I discussed with the patient this proposed procedure of Kybella, which is customized depending on the particular needs of the patient. It is performed on the submental area for reduction in the fat.  The alternatives were discussed with the patient. The risks were addressed including bleeding, scarring, infection, damage to deeper structures, asymmetry, numbness, formation of areas of hardness, swelling, nodules, skin ulceration, headache, alopecia, difficulty swallowing, and muscle weakness. Additionally, marginal mandibular nerve injury could occur and is manifested as an asymmetric smile or facial muscle weakness.  The individual's choice to undergo a surgical procedure is based on the comparison of risks to potential benefits. Injections do not arrest the aging process or produce permanent tightening of the skin.  Operative intervention maybe necessary to maintain the results. The patient understands and wishes to proceed. An informed consent was signed and informational brochures given to her prior to the procedure.  Procedure: The area was prepped with alcohol and dried with a clean gauze. Using a clean technique, the submental area was palpated and pinched.  The skin at the area was pulled and there was appropriate elasticity noted without excessive skin laxity. The patient was asked to tense the platysma to define the subcutaneous fat between the dermis and platysma.  A skin marker was used to mark the anterior, posterior and lateral borders of the submental fat compartment. The "no treatment Zone" was marked. The treatment zone was injected in the subcutaneous layer with  lidocaine 1% with epinepherine. The grid was placed on the skin with the water over it for activation of the ink.  The clear protective sheet was removed leaving the markings.  The dots outside the previously marked treatment zone were removed with an alcohol wipe.  The pre-platysmal fat was pinched with 2 fingers.  Each dot was injected perpendicular to the skin with .2 cc of Kybella using a 1 ml syring and a 30 gauge needle. The ink was wiped off the skin with an alcohol wipe and a cold pack was applied to the treatment area. No complications were noted. Light pressure with an ice pack was held for 5 minutes. She was instructed explicitly in post-operative care including no massage, heavy activity, work out or facial for 24 hours.  Deoxycholic Acid LOT:  [id number] 

## 2021-03-13 ENCOUNTER — Ambulatory Visit (INDEPENDENT_AMBULATORY_CARE_PROVIDER_SITE_OTHER): Payer: Self-pay | Admitting: Plastic Surgery

## 2021-03-13 ENCOUNTER — Other Ambulatory Visit: Payer: Self-pay

## 2021-03-13 ENCOUNTER — Encounter: Payer: Self-pay | Admitting: Plastic Surgery

## 2021-03-13 DIAGNOSIS — Z719 Counseling, unspecified: Secondary | ICD-10-CM

## 2021-03-13 NOTE — Progress Notes (Signed)
Kybella Procedure Note  Procedure: Cosmetic deoxycholic acid injection (2 vials were used)  Pre-operative Diagnosis: Submental fullness  Post-operative Diagnosis: Same  Complications:  None  Brief history: The patient desires improvement in the appearance of moderate to severe convexity or fullness associated with submental fat. I discussed with the patient this proposed procedure of Kybella, which is customized depending on the particular needs of the patient. It is performed on the submental area for reduction in the fat.  The alternatives were discussed with the patient. The risks were addressed including bleeding, scarring, infection, damage to deeper structures, asymmetry, numbness, formation of areas of hardness, swelling, nodules, skin ulceration, headache, alopecia, difficulty swallowing, and muscle weakness. Additionally, marginal mandibular nerve injury could occur and is manifested as an asymmetric smile or facial muscle weakness.  The individual's choice to undergo a surgical procedure is based on the comparison of risks to potential benefits. Injections do not arrest the aging process or produce permanent tightening of the skin.  Operative intervention maybe necessary to maintain the results. The patient understands and wishes to proceed. An informed consent was signed and informational brochures given to her prior to the procedure.  Procedure: The area was prepped with alcohol and dried with a clean gauze. Using a clean technique, the submental area was palpated and pinched.  The skin at the area was pulled and there was appropriate elasticity noted without excessive skin laxity. The patient was asked to tense the platysma to define the subcutaneous fat between the dermis and platysma.  A skin marker was used to mark the anterior, posterior and lateral borders of the submental fat compartment. The "no treatment Zone" was marked. The treatment zone was injected in the subcutaneous layer with  lidocaine 1% with epinepherine. The grid was placed on the skin with the water over it for activation of the ink.  The clear protective sheet was removed leaving the markings.  The dots outside the previously marked treatment zone were removed with an alcohol wipe.  The pre-platysmal fat was pinched with 2 fingers.  Each dot was injected perpendicular to the skin with .2 cc of Kybella using a 1 ml syring and a 30 gauge needle. The ink was wiped off the skin with an alcohol wipe and a cold pack was applied to the treatment area. No complications were noted. Light pressure with an ice pack was held for 5 minutes. She was instructed explicitly in post-operative care including no massage, heavy activity, work out or facial for 24 hours.  Deoxycholic Acid LOT:  [id number] 

## 2021-06-02 IMAGING — US US OB < 14 WEEKS - US OB TV
1 series · 15 of 28 positions shown · non-contrast
Comparison: None.

CLINICAL DATA: Bleeding, abdominal pain

EXAM:
OBSTETRIC <14 WK US AND TRANSVAGINAL OB US
TECHNIQUE: Both transabdominal and transvaginal ultrasound examinations were
performed for complete evaluation of the gestation as well as the
maternal uterus, adnexal regions, and pelvic cul-de-sac.
Transvaginal technique was performed to assess early pregnancy.

[Series 1: us ob < 14 weeks - us ob tv · 15 of 48 slices shown]
[im 1/48]
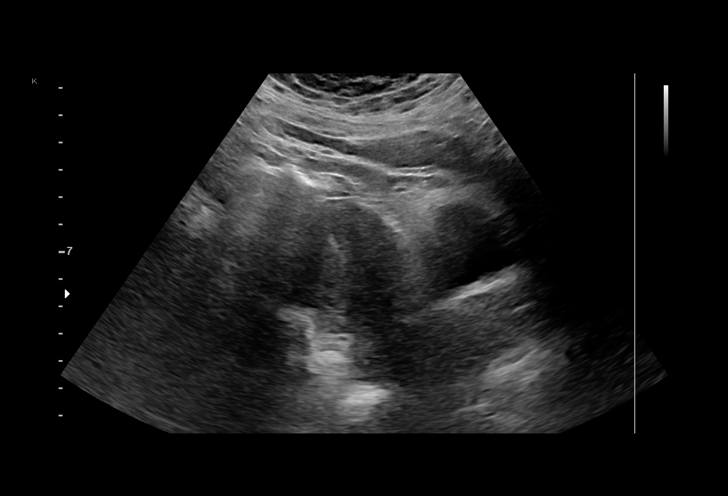
[im 4/48]
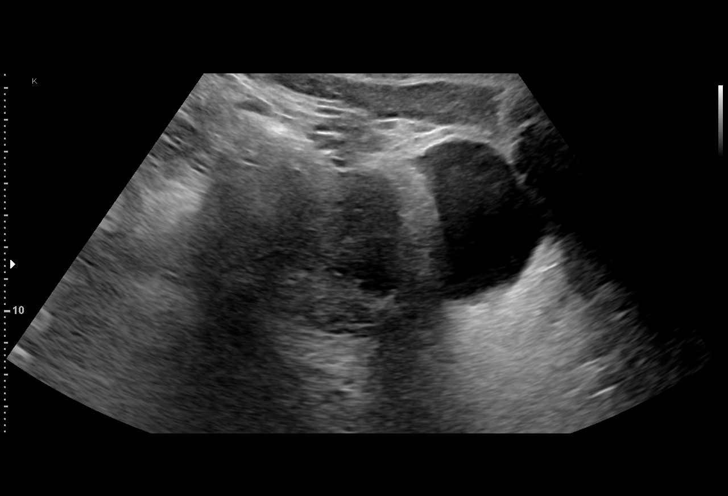
[im 7/48]
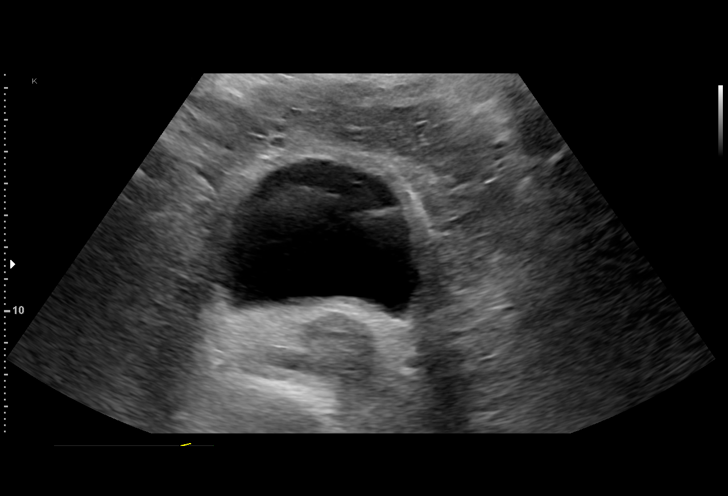
[im 11/48]
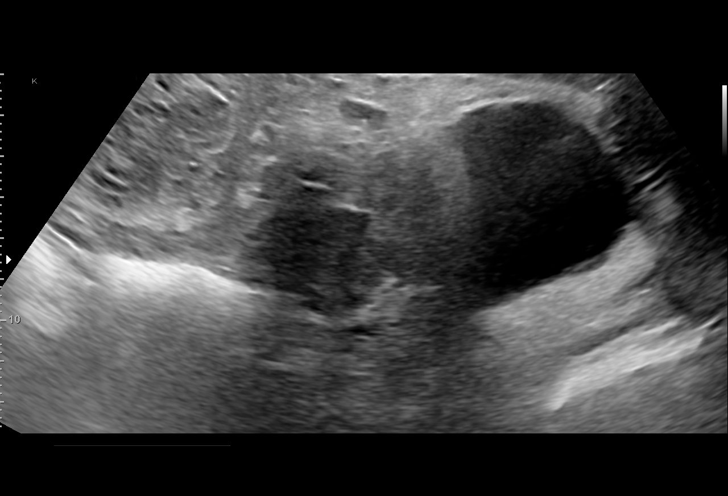
[im 14/48]
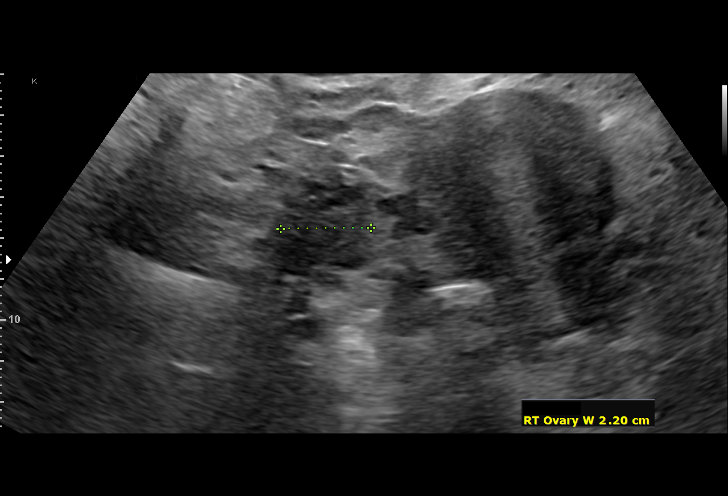
[im 18/48]
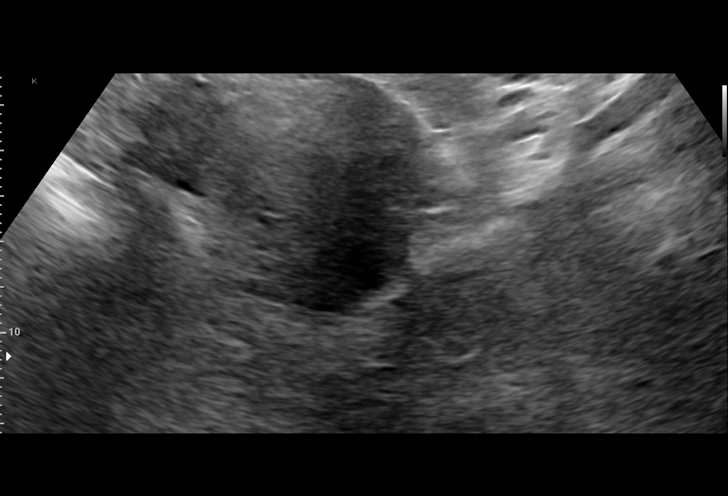
[im 21/48]
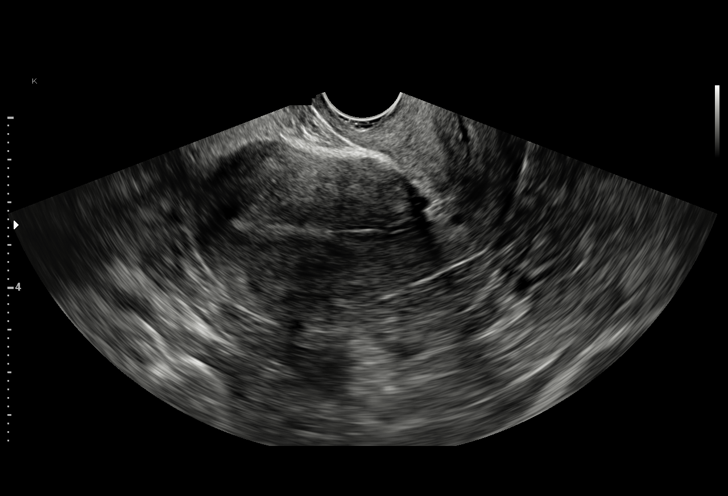
[im 25/48]
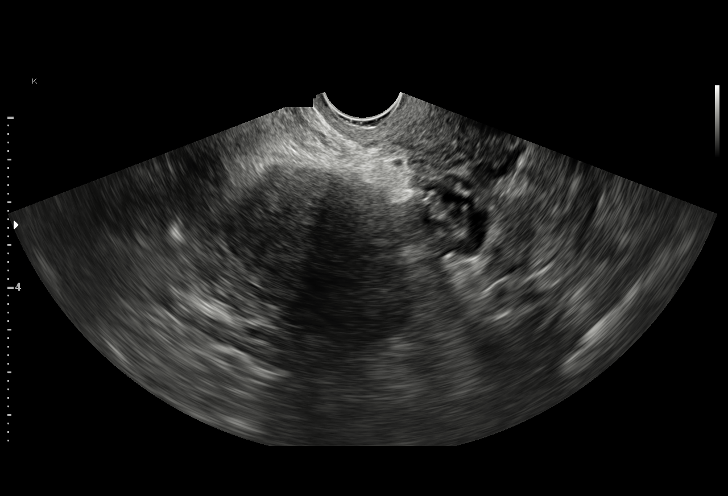
[im 27/48]
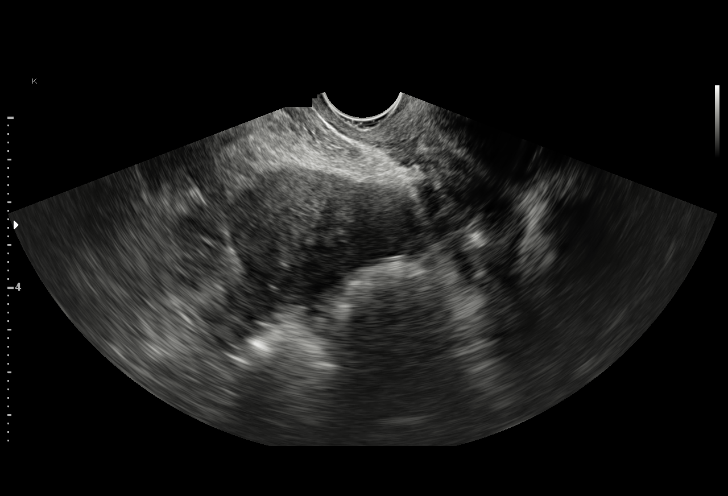
[im 30/48]
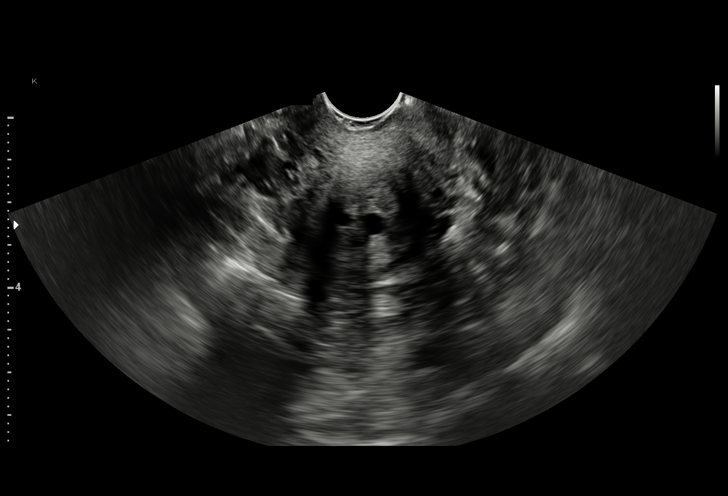
[im 34/48]
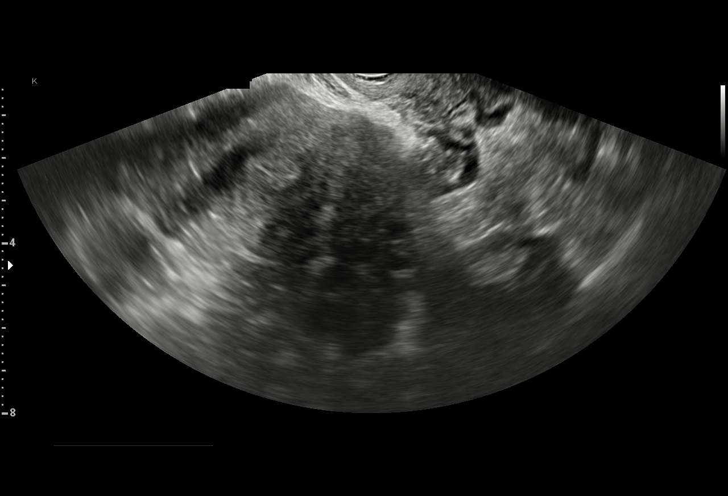
[im 37/48]
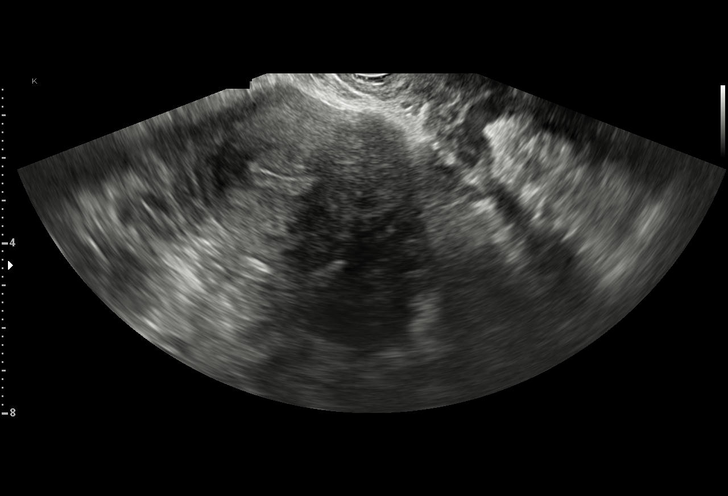
[im 41/48]
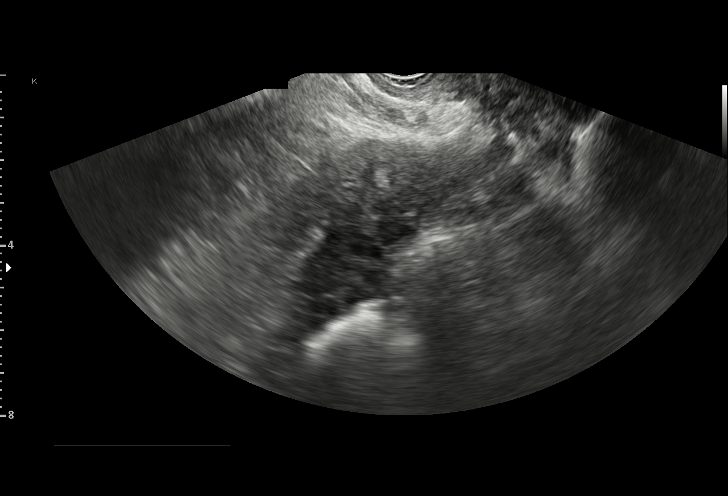
[im 44/48]
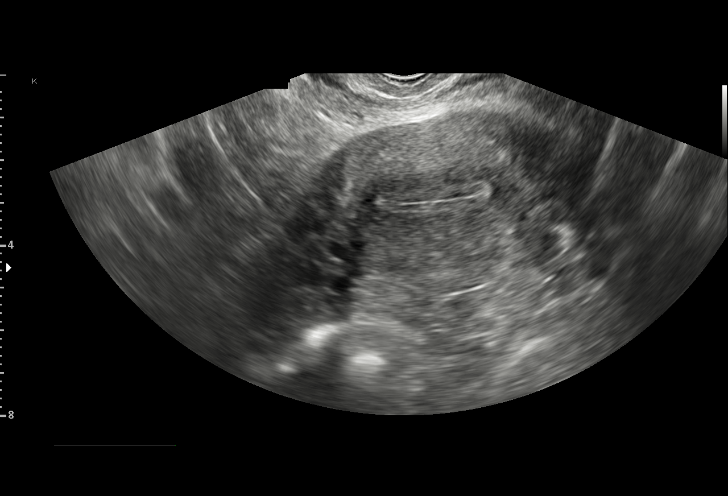
[im 48/48]
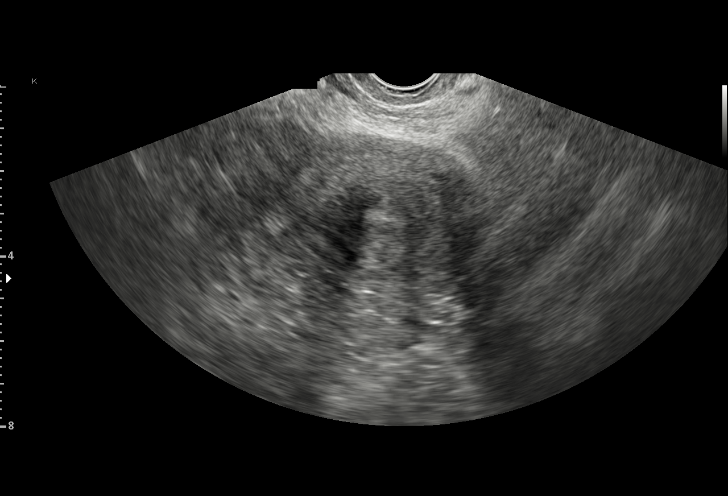

[15 of 28 positions shown; findings below may reference images not displayed]

FINDINGS: Intrauterine gestational sac: None

Yolk sac:  Not visualized

Embryo:  Not visualized

Cardiac Activity: Not visualized

Heart Rate:   Bpm

MSD:   mm    w     d

CRL:    mm    w    d                  US EDC:

Subchorionic hemorrhage:  None visualized.

Maternal uterus/adnexae: No adnexal mass or free fluid.
IMPRESSION: No intrauterine pregnancy visualized. Differential considerations
would include early intrauterine pregnancy too early to visualize,
spontaneous abortion, or occult ectopic pregnancy. Recommend close
clinical followup and serial quantitative beta HCGs and ultrasounds.

## 2021-06-15 ENCOUNTER — Ambulatory Visit: Payer: Self-pay | Admitting: Plastic Surgery

## 2021-07-11 ENCOUNTER — Other Ambulatory Visit: Payer: Self-pay

## 2021-07-11 ENCOUNTER — Ambulatory Visit: Payer: Self-pay | Attending: Physician Assistant | Admitting: Physician Assistant

## 2021-07-11 ENCOUNTER — Encounter: Payer: Self-pay | Admitting: Physician Assistant

## 2021-07-11 VITALS — BP 118/87 | HR 98 | Ht 65.0 in | Wt 298.4 lb

## 2021-07-11 DIAGNOSIS — Z789 Other specified health status: Secondary | ICD-10-CM

## 2021-07-11 DIAGNOSIS — R221 Localized swelling, mass and lump, neck: Secondary | ICD-10-CM

## 2021-07-11 DIAGNOSIS — R7303 Prediabetes: Secondary | ICD-10-CM

## 2021-07-11 MED ORDER — METHOCARBAMOL 500 MG PO TABS
500.0000 mg | ORAL_TABLET | Freq: Three times a day (TID) | ORAL | 0 refills | Status: DC | PRN
  Filled 2021-07-11: qty 90, 30d supply, fill #0

## 2021-07-11 NOTE — Progress Notes (Signed)
Ashley Grant [phone] Bump on back on neck if not pressed on pain is a 5 but if she presses on the bump pain is a 8. Bump will disappear but comes back bigger.

## 2021-07-11 NOTE — Progress Notes (Signed)
Ashley Grant, is a 40 y.o. female  KZS:[id number]  [id number]  DOB - 06/29/1981  Chief Complaint  Patient presents with   Mass    On back on Neck       Subjective:   Ashley Grant is a 40 y.o. female here today for for a lump on the back L of her neck for about 5 months.  It waxes and wanes in size.  Worse with stress.  No fevers.  No constitutional s/sx.    No problems updated.  ALLERGIES: No Known Allergies  PAST MEDICAL HISTORY: Past Medical History:  Diagnosis Date   Obesity (BMI 30-39.9)    PCOS (polycystic ovarian syndrome)     MEDICATIONS AT HOME: Prior to Admission medications   Medication Sig Start Date End Date Taking? Authorizing Provider  methocarbamol (ROBAXIN) 500 MG tablet Take 1 tablet (500 mg total) by mouth every 8 (eight) hours as needed for muscle spasms. 07/11/21  Yes Markeshia Giebel M, PA-C  naproxen (NAPROSYN) 500 MG tablet Take 1 tablet (500 mg total) by mouth 2 (two) times daily with a meal. Prn pain Patient not taking: No sig reported 05/24/20   Anders Simmonds, PA-C    ROS: Neg HEENT Neg resp Neg cardiac Neg GI Neg GU Neg MS Neg psych Neg neuro  Objective:   Vitals:   07/11/21 0959  BP: 118/87  Pulse: 98  SpO2: 97%  Weight: 298 lb 6 oz (135.3 kg)  Height: 5\' 5"  (1.651 m)   Exam General appearance : Awake, alert, not in any distress. Speech Clear. Not toxic looking HEENT: Atraumatic and Normocephalic Neck: Supple, no JVD. No cervical lymphadenopathy. Posterior neck at base there is 1cm mobile and tender area that is well circumscribed. No other LN or swelling noted Chest: Good air entry bilaterally, CTAB.  No rales/rhonchi/wheezing CVS: S1 S2 regular, no murmurs.  Extremities: B/L Lower Ext shows no edema, both legs are warm to touch Neurology: Awake alert, and oriented X 3, CN II-XII intact, Non focal Skin: No Rash  Data Review Lab Results  Component Value Date   HGBA1C 5.7 (H) 05/25/2020    HGBA1C 5.9 12/03/2019   HGBA1C 6.0 (H) 10/22/2019    Assessment & Plan   1. Localized swelling, mass and lump, neck Lymph node vs lipoma vs other - CBC with Differential - methocarbamol (ROBAXIN) 500 MG tablet; Take 1 tablet (500 mg total) by mouth every 8 (eight) hours as needed for muscle spasms.  Dispense: 90 tablet; Refill: 0 - 14/09/2019 Soft Tissue Head/Neck (NON-THYROID); Future  scheduled 9/7  2. Language barrier AMN interpreters used and additional time performing visit was required.   3. Prediabetes I have had a lengthy discussion and provided education about insulin resistance and the intake of too much sugar/refined carbohydrates.  I have advised the patient to work at a goal of eliminating sugary drinks, candy, desserts, sweets, refined sugars, processed foods, and white carbohydrates.  The patient expresses understanding.  - Hemoglobin A1c - Basic metabolic panel    Patient have been counseled extensively about nutrition and exercise. Other issues discussed during this visit include: low cholesterol diet, weight control and daily exercise, foot care, annual eye examinations at Ophthalmology, importance of adherence with medications and regular follow-up. We also discussed long term complications of uncontrolled diabetes and hypertension.   Return if symptoms worsen or fail to improve.  The patient was given clear instructions to go to ER or return to medical center if symptoms  don't improve, worsen or new problems develop. The patient verbalized understanding. The patient was told to call to get lab results if they haven't heard anything in the next week.      Georgian Co, PA-C [facility] and [facility] [place], [place] [phone]   07/11/2021, 10:27 AM

## 2021-07-12 ENCOUNTER — Other Ambulatory Visit: Payer: Self-pay | Admitting: Physician Assistant

## 2021-07-12 LAB — BASIC METABOLIC PANEL
BUN/Creatinine Ratio: 11 (ref 9–23)
BUN: 9 mg/dL (ref 6–20)
CO2: 24 mmol/L (ref 20–29)
Calcium: 9.2 mg/dL (ref 8.7–10.2)
Chloride: 102 mmol/L (ref 96–106)
Creatinine, Ser: 0.83 mg/dL (ref 0.57–1.00)
Glucose: 90 mg/dL (ref 65–99)
Potassium: 4.3 mmol/L (ref 3.5–5.2)
Sodium: 139 mmol/L (ref 134–144)
eGFR: 92 mL/min/{1.73_m2} (ref >59)

## 2021-07-12 LAB — CBC WITH DIFFERENTIAL/PLATELET
Basophils Absolute: 0.1 10*3/uL (ref 0.0–0.2)
Basos: 1 %
EOS (ABSOLUTE): 0.1 10*3/uL (ref 0.0–0.4)
Eos: 1 %
Hematocrit: 42.7 % (ref 34.0–46.6)
Hemoglobin: 14.4 g/dL (ref 11.1–15.9)
Immature Grans (Abs): 0 10*3/uL (ref 0.0–0.1)
Immature Granulocytes: 0 %
Lymphocytes Absolute: 3 10*3/uL (ref 0.7–3.1)
Lymphs: 33 %
MCH: 28.9 pg (ref 26.6–33.0)
MCHC: 33.7 g/dL (ref 31.5–35.7)
MCV: 86 fL (ref 79–97)
Monocytes Absolute: 0.7 10*3/uL (ref 0.1–0.9)
Monocytes: 8 %
Neutrophils Absolute: 5.2 10*3/uL (ref 1.4–7.0)
Neutrophils: 57 %
Platelets: 355 10*3/uL (ref 150–450)
RBC: 4.98 x10E6/uL (ref 3.77–5.28)
RDW: 12.4 % (ref 11.7–15.4)
WBC: 9.1 10*3/uL (ref 3.4–10.8)

## 2021-07-12 LAB — HEMOGLOBIN A1C
Est. average glucose Bld gHb Est-mCnc: 131 mg/dL
Hgb A1c MFr Bld: 6.2 % — ABNORMAL HIGH (ref 4.8–5.6)

## 2021-07-12 MED ORDER — METFORMIN HCL 500 MG PO TABS: 500.0000 mg | ORAL_TABLET | Freq: Two times a day (BID) | ORAL | 3 refills | Status: DC

## 2021-07-18 ENCOUNTER — Ambulatory Visit (HOSPITAL_COMMUNITY)
Admission: RE | Admit: 2021-07-18 | Discharge: 2021-07-18 | Disposition: A | Discharge status: Home / Self Care | Payer: Self-pay | Source: Ambulatory Visit | Attending: Physician Assistant | Admitting: Physician Assistant

## 2021-07-18 ENCOUNTER — Other Ambulatory Visit: Payer: Self-pay

## 2021-07-18 DIAGNOSIS — R221 Localized swelling, mass and lump, neck: Secondary | ICD-10-CM | POA: Insufficient documentation

## 2021-07-19 ENCOUNTER — Telehealth: Payer: Self-pay | Admitting: Family Medicine

## 2021-07-19 NOTE — Telephone Encounter (Signed)
Patient calling for lab results, no current results seen in the chart, only result is Ultrasound and patient received those results on 07/19/21.

## 2021-07-19 NOTE — Telephone Encounter (Signed)
Called pt message clarified

## 2021-07-19 NOTE — Telephone Encounter (Signed)
Copied from CRM [id number]. Topic: Quick Communication - Lab Results (Clinic Use ONLY) >> Jul 19, 2021  1:15 PM Royce Macadamia, RN wrote: Called patient to inform them of  lab results. When patient returns call, triage nurse may disclose results.     Pt calling to receive results. Attempted to transfer pt to NT with no response. Pt does need a spanish interpreter. Please advise.

## 2021-09-06 ENCOUNTER — Telehealth: Payer: Self-pay | Admitting: Family Medicine

## 2021-09-06 ENCOUNTER — Other Ambulatory Visit (HOSPITAL_COMMUNITY): Payer: Self-pay

## 2021-09-06 NOTE — Telephone Encounter (Signed)
Medication: metFORMIN (GLUCOPHAGE) 500 MG tablet [[id number]] -   Pt is requesting if the script could be rewritten for 1000mg  to be taken one time a day  Has the patient contacted their pharmacy? Yes - Pharmacy advised that patient called (Agent: If no, request that the patient contact the pharmacy for the refill. If patient does not wish to contact the pharmacy document the reason why and proceed with request.) (Agent: If yes, when and what did the pharmacy advise?)  Preferred Pharmacy (with phone number or street name): [facility] [address] [place], [place]. [place] [phone] Has the patient been seen for an appointment in the last year OR does the patient have an upcoming appointment? YES 07/11/21  Agent: Please be advised that RX refills may take up to 3 business days. We ask that you follow-up with your pharmacy.

## 2021-09-06 NOTE — Telephone Encounter (Signed)
Patient would like Metformin Rx changed 1000 mg daily, so she could just take once a day instead of twice a day as ordered.

## 2021-09-07 NOTE — Telephone Encounter (Signed)
Okay to rewrite

## 2021-09-10 ENCOUNTER — Other Ambulatory Visit: Payer: Self-pay

## 2021-09-10 MED ORDER — METFORMIN HCL 1000 MG PO TABS
1000.0000 mg | ORAL_TABLET | Freq: Two times a day (BID) | ORAL | 2 refills | Status: DC
  Filled 2021-09-10: qty 60, 30d supply, fill #0

## 2021-09-10 NOTE — Telephone Encounter (Signed)
Rx sent 

## 2021-09-10 NOTE — Telephone Encounter (Signed)
Called pt made aware of RX sent to pharmacy 

## 2021-09-13 ENCOUNTER — Other Ambulatory Visit: Payer: Self-pay

## 2022-04-22 ENCOUNTER — Ambulatory Visit: Payer: Self-pay | Attending: Internal Medicine | Admitting: Internal Medicine

## 2022-04-22 VITALS — BP 126/89 | HR 88 | Temp 98.1°F | Resp 16 | Wt 241.8 lb

## 2022-04-22 DIAGNOSIS — R030 Elevated blood-pressure reading, without diagnosis of hypertension: Secondary | ICD-10-CM

## 2022-04-22 DIAGNOSIS — N911 Secondary amenorrhea: Secondary | ICD-10-CM

## 2022-04-22 LAB — POCT URINE PREGNANCY: Preg Test, Ur: NEGATIVE

## 2022-04-22 MED ORDER — MEDROXYPROGESTERONE ACETATE 10 MG PO TABS
10.0000 mg | ORAL_TABLET | Freq: Every day | ORAL | 0 refills | Status: DC
  Filled 2022-04-22: qty 10, 10d supply, fill #0

## 2022-04-22 NOTE — Progress Notes (Signed)
Patient said she has not had a cycle in over 1 year.Patient said that she had the same issues in 2014 but loss weight. She is now having the same issues again.

## 2022-04-22 NOTE — Patient Instructions (Signed)
Take Metformin twice a day as prescribed.  Start Provera 10 mg and take one tablet daily for 7 days.  Your menses should come about 1 week after you complete the prescription.   Return to the laboratory tomorrow to have your blood test done.

## 2022-04-22 NOTE — Progress Notes (Signed)
Patient ID: Ashley MediciSusana Maria Siliesar Grant, female    DOB: 1981-06-30  MRN: [id number]  CC: Amenorrhea   Subjective: Ashley Grant is a 41 y.o. female who presents for UC visit Her concerns today include:  PreDM, obesity  Pt c/o no menses x 1 yr Not on any method of BC including shots, IUD or implant.  Pt reports menses irregular for a while Then after having daughter 8 yrs ago, menses became more regular for 1 yr.  After that menses became irregular again where she would go months without menses.  This has been the longest that she has not had a cycle.   She is not sexually active.  Last sexually after about 3 mths ago.  Did not use condoms.   On Metformin 1000 mg once a day.  Suppose to be taking BID Endorses mild hirsutism, shaves her chin BP noted to be elevated today.  Reports hx of HTN during last pregnancy but then normalized. Patient Active Problem List   Diagnosis Date Noted   Encounter for counseling 10/02/2020   PCOS (polycystic ovarian syndrome) 03/26/2018   Hirsutism 03/26/2018   Acute pain of left knee 05/19/2017   Obesity 04/01/2016   Environmental allergies 04/01/2016   Ganglion cyst of wrist 04/01/2016   Abnormal finding on urinalysis 04/01/2016   S/P C-section 04/09/2014   Gestational HTN 04/06/2014     Current Outpatient Medications on File Prior to Visit  Medication Sig Dispense Refill   metFORMIN (GLUCOPHAGE) 1000 MG tablet Take 1 tablet (1,000 mg total) by mouth 2 (two) times daily with a meal. 60 tablet 2   methocarbamol (ROBAXIN) 500 MG tablet Take 1 tablet (500 mg total) by mouth every 8 (eight) hours as needed for muscle spasms. 90 tablet 0   naproxen (NAPROSYN) 500 MG tablet Take 1 tablet (500 mg total) by mouth 2 (two) times daily with a meal. Prn pain (Patient not taking: Reported on 11/14/2020) 60 tablet 0   No current facility-administered medications on file prior to visit.    No Known Allergies  Social History   Socioeconomic  History   Marital status: Single    Spouse name: Not on file   Number of children: Not on file   Years of education: Not on file   Highest education level: Not on file  Occupational History   Not on file  Tobacco Use   Smoking status: Never   Smokeless tobacco: Never  Vaping Use   Vaping Use: Never used  Substance and Sexual Activity   Alcohol use: No   Drug use: No   Sexual activity: Yes  Other Topics Concern   Not on file  Social History Narrative   ** Merged History Encounter **       Social Determinants of Health   Financial Resource Strain: Not on file  Food Insecurity: Not on file  Transportation Needs: Not on file  Physical Activity: Not on file  Stress: Not on file  Social Connections: Not on file  Intimate Partner Violence: Not on file    Family History  Problem Relation Age of Onset   Diabetes Father    Diabetes Maternal Grandfather    Cancer Paternal Grandfather     Past Surgical History:  Procedure Laterality Date   CESAREAN SECTION N/A 04/08/2014   Procedure: CESAREAN SECTION;  Surgeon: Adam PhenixJames G Arnold, MD;  Location: [facility];  Service: Obstetrics;  Laterality: N/A;    ROS: Review of Systems Negative except as stated  above  PHYSICAL EXAM: BP 126/89   Pulse 88   Temp 98.1 F (36.7 C) (Oral)   Resp 16   Wt 241 lb 12.8 oz (109.7 kg)   SpO2 96%   BMI 40.24 kg/m   Physical Exam BP 141/94 General appearance - alert, well appearing, and in no distress Mental status - normal mood, behavior, speech, dress, motor activity, and thought processes Chest - clear to auscultation, no wheezes, rales or rhonchi, symmetric air entry Heart - normal rate, regular rhythm, normal S1, S2, no murmurs, rubs, clicks or gallops Skin -patient has troubles of hair on chin      Latest Ref Rng & Units 07/11/2021   10:41 AM 11/14/2020   11:09 AM 04/27/2020    1:02 PM  CMP  Glucose 65 - 99 mg/dL 90  80  726   BUN 6 - 20 mg/dL 9  10  8    Creatinine 0.57 - 1.00 mg/dL   2.03  5.59   Sodium 134 - 144 mmol/L 139  138  138   Potassium 3.5 - 5.2 mmol/L 4.3  4.3  3.6   Chloride 96 - 106 mmol/L 102  100  105   CO2 20 - 29 mmol/L 24  22  27    Calcium 8.7 - 10.2 mg/dL 9.2  9.3  9.0   Total Protein 6.0 - 8.5 g/dL  7.2  6.9   Total Bilirubin 0.0 - 1.2 mg/dL  0.2  0.3   Alkaline Phos 44 - 121 IU/L  129  94   AST 0 - 40 IU/L  31  27   ALT 0 - 32 IU/L  38  38    Lipid Panel     Component Value Date/Time   CHOL 207 (H) 11/14/2020 1109   TRIG 163 (H) 11/14/2020 1109   HDL 30 (L) 11/14/2020 1109   CHOLHDL 6.9 (H) 11/14/2020 1109   LDLCALC 147 (H) 11/14/2020 1109    CBC    Component Value Date/Time   WBC 9.1 07/11/2021 1041   WBC 8.2 04/27/2020 1302   RBC 4.98 07/11/2021 1041   RBC 4.49 04/27/2020 1302   HGB 14.4 07/11/2021 1041   HCT 42.7 07/11/2021 1041   PLT 355 07/11/2021 1041   MCV 86 07/11/2021 1041   MCH 28.9 07/11/2021 1041   MCH 28.7 04/27/2020 1302   MCHC 33.7 07/11/2021 1041   MCHC 32.1 04/27/2020 1302   RDW 12.4 07/11/2021 1041   LYMPHSABS 3.0 07/11/2021 1041   MONOABS 350 04/01/2016 1505   EOSABS 0.1 07/11/2021 1041   BASOSABS 0.1 07/11/2021 1041   Results for orders placed or performed in visit on 04/22/22  POCT urine pregnancy  Result Value Ref Range   Preg Test, Ur Negative Negative     ASSESSMENT AND PLAN: 1. Secondary amenorrhea Patient most likely has PCOS. Dx diagnosis.  Pt states she was told in the past that she may have PCOS Recommend that she takes the metformin twice a day as was prescribed by her PCP. We will give a Provera challenge to see if it will bring about withdrawal bleeding.  Check hormone levels. Follow-up with PCP in about 6 weeks.  If she did respond to Provera, can consider having her take Provera for 1 week once a month or once every 2 months to induce withdrawal bleeding. - TSH+T4F+T3Free - Prolactin - FSH/LH - medroxyPROGESTERone (PROVERA) 10 MG tablet; Take 1 tablet (10 mg total) by mouth  daily.  Dispense: 10 tablet;  Refill: 0 - POCT urine pregnancy  2. Elevated blood-pressure reading without diagnosis of hypertension DASH diet encouraged.  [employer] Language interpreter used during this encounter. #[id number] Grant   Patient was given the opportunity to ask questions.  Patient verbalized understanding of the plan and was able to repeat key elements of the plan.   This documentation was completed using Paediatric nurse.  Any transcriptional errors are unintentional.  No orders of the defined types were placed in this encounter.    Requested Prescriptions    No prescriptions requested or ordered in this encounter    No follow-ups on file.  Jonah Blue, MD, FACP

## 2022-04-23 ENCOUNTER — Other Ambulatory Visit: Payer: Self-pay

## 2022-04-23 ENCOUNTER — Ambulatory Visit: Payer: Self-pay | Attending: Family Medicine

## 2022-04-24 LAB — TSH+T4F+T3FREE
Free T4: 1.14 ng/dL (ref 0.82–1.77)
T3, Free: 2.8 pg/mL (ref 2.0–4.4)
TSH: 1.22 u[IU]/mL (ref 0.450–4.500)

## 2022-04-24 LAB — PROLACTIN: Prolactin: 9.8 ng/mL (ref 4.8–23.3)

## 2022-04-24 LAB — FSH/LH
FSH: 4.9 m[IU]/mL
LH: 7.3 m[IU]/mL

## 2022-06-26 ENCOUNTER — Ambulatory Visit: Payer: Self-pay

## 2022-06-26 NOTE — Telephone Encounter (Signed)
  Chief Complaint: Pt would like to get more Provera Symptoms: No period Frequency: Ongoing Pertinent Negatives: Patient denies  Disposition: [] ED /[] Urgent Care (no appt availability in office) / [] Appointment(In office/virtual)/ []  [facility]/ [] Home Care/ [] Refused Recommended Disposition /[] [facility]/ [x]  Follow-up with PCP Additional Notes: See note from provider. Pt was given this medication and was able to have a period. Pt would like to get a refill on this medication. Pt does have upcoming appt and understands that labs/ov may be needed prior to refill of this medication.   Please advise.   Note from Provider 04/22/2022:  ASSESSMENT AND PLAN: 1. Secondary amenorrhea Patient most likely has PCOS. Dx diagnosis.  Pt states she was told in the past that she may have PCOS Recommend that she takes the metformin twice a day as was prescribed by her PCP. We will give a Provera challenge to see if it will bring about withdrawal bleeding.  Check hormone levels. Follow-up with PCP in about 6 weeks.  If she did respond to Provera, can consider having her take Provera for 1 week once a month or once every 2 months to induce withdrawal bleeding. - TSH+T4F+T3Free - Prolactin - FSH/LH - medroxyPROGESTERone (PROVERA) 10 MG tablet; Take 1 tablet (10 mg total) by mouth daily.  Dispense: 10 tablet; Refill: 0 - POCT urine pregnancy   Summary: pt has stop menstruate after coming off med   medroxyPROGESTERone (PROVERA) 10 MG tablet  Pt states given to her at June appt and she was able to have her periods since, she has no more medication and did not menstruate last visit 6/13 this month, she is wanting to know if can get a refill.  Call pt ? does ok without intrp.  [facility] at [facility]  [address] [place] July 7/13  Phone: [phone] Fax: [phone]  Hours: M-F 7:30a-6:00p      Reason for Disposition  [1]  Prescription refill request for NON-ESSENTIAL medicine (i.e., no harm to patient if med not taken) AND [2] triager unable to refill per department policy  Answer Assessment - Initial Assessment Questions 1. DRUG NAME: "What medicine do you need to have refilled?"     Provera 2. REFILLS REMAINING: "How many refills are remaining?" (Note: The label on the medicine or pill bottle will show how many refills are remaining. If there are no refills remaining, then a renewal may be needed.)     none 3. EXPIRATION DATE: "What is the expiration date?" (Note: The label states when the prescription will expire, and thus can no longer be refilled.)      4. PRESCRIBING HCP: "Who prescribed it?" Reason: If prescribed by specialist, call should be referred to that group.      5. SYMPTOMS: "Do you have any symptoms?"     No period 6. PREGNANCY: "Is there any chance that you are pregnant?" "When was your last menstrual period?"     na  Protocols used: Medication Refill and Renewal Call-A-AH

## 2022-07-17 ENCOUNTER — Other Ambulatory Visit: Payer: Self-pay

## 2022-07-17 ENCOUNTER — Encounter: Payer: Self-pay | Admitting: Physician Assistant

## 2022-07-17 ENCOUNTER — Ambulatory Visit: Payer: Self-pay | Attending: Physician Assistant | Admitting: Physician Assistant

## 2022-07-17 VITALS — BP 121/82 | HR 93 | Ht 65.0 in | Wt 298.0 lb

## 2022-07-17 DIAGNOSIS — R21 Rash and other nonspecific skin eruption: Secondary | ICD-10-CM

## 2022-07-17 DIAGNOSIS — N911 Secondary amenorrhea: Secondary | ICD-10-CM

## 2022-07-17 DIAGNOSIS — R7303 Prediabetes: Secondary | ICD-10-CM

## 2022-07-17 MED ORDER — METFORMIN HCL 1000 MG PO TABS
1000.0000 mg | ORAL_TABLET | Freq: Two times a day (BID) | ORAL | 3 refills | Status: DC
  Filled 2022-07-17: qty 60, 30d supply, fill #0

## 2022-07-17 MED ORDER — MEDROXYPROGESTERONE ACETATE 10 MG PO TABS
ORAL_TABLET | ORAL | 5 refills | Status: DC
  Filled 2022-07-17: qty 7, 30d supply, fill #0

## 2022-07-17 MED ORDER — CLOTRIMAZOLE-BETAMETHASONE 1-0.05 % EX CREA
1.0000 | TOPICAL_CREAM | Freq: Two times a day (BID) | CUTANEOUS | 0 refills | Status: DC
  Filled 2022-07-17: qty 45, 23d supply, fill #0

## 2022-07-17 NOTE — Progress Notes (Signed)
Patient ID: Ashley Grant, female   DOB: Jan 29, 1981, 41 y.o.   MRN: [id number]   Ashley Grant, is a 41 y.o. female  [id number]:[id number]  [id number]  DOB - Aug 05, 1981  Chief Complaint  Patient presents with   Rash    On neck   Medication Refill       Subjective:   Ashley Grant is a 41 y.o. female here today for a follow up visit after Dr Laural Benes had given her a provera challenge that was successful.  However, she has not had another bleed since then.  She says she has been taking her metformin twice daily.  Uses condoms if SA but not currently SA.     Rash on neck No problems updated.  ALLERGIES: No Known Allergies  PAST MEDICAL HISTORY: Past Medical History:  Diagnosis Date   Obesity (BMI 30-39.9)    PCOS (polycystic ovarian syndrome)     MEDICATIONS AT HOME: Prior to Admission medications   Medication Sig Start Date End Date Taking? Authorizing Provider  clotrimazole-betamethasone (LOTRISONE) cream Apply 1 Application topically 2 (two) times daily. X 3 weeks 07/17/22  Yes Harris Kistler M, PA-C  medroxyPROGESTERone (PROVERA) 10 MG tablet Take daily for 1 week every 2 months 07/17/22   Anders Simmonds, PA-C  metFORMIN (GLUCOPHAGE) 1000 MG tablet Take 1 tablet (1,000 mg total) by mouth 2 (two) times daily with a meal. 07/17/22   [name], [name], PA-C  methocarbamol (ROBAXIN) 500 MG tablet Take 1 tablet (500 mg total) by mouth every 8 (eight) hours as needed for muscle spasms. Patient not taking: Reported on 07/17/2022 07/11/21   Anders Simmonds, PA-C    ROS: Neg HEENT Neg resp Neg cardiac Neg GI Neg GU Neg MS Neg psych Neg neuro  Objective:   Vitals:   07/17/22 1128  BP: 121/82  Pulse: 93  SpO2: 94%  Weight: 298 lb (135.2 kg)  Height: 5\' 5"  (1.651 m)   Exam General appearance : Awake, alert, not in any distress. Speech Clear. Not toxic looking HEENT: Atraumatic and Normocephalic Neck: Supple, no JVD. No cervical  lymphadenopathy.  Chest: Good air entry bilaterally, CTAB.  No rales/rhonchi/wheezing CVS: S1 S2 regular, no murmurs.  Extremities: B/L Lower Ext shows no edema, both legs are warm to touch Neurology: Awake alert, and oriented X 3, CN II-XII intact, Non focal Skin: rash on neck appears fungal  Data Review Lab Results  Component Value Date   HGBA1C 6.2 (H) 07/11/2021   HGBA1C 5.7 (H) 05/25/2020   HGBA1C 5.9 12/03/2019    Assessment & Plan   1. Prediabetes continue - metFORMIN (GLUCOPHAGE) 1000 MG tablet; Take 1 tablet (1,000 mg total) by mouth 2 (two) times daily with a meal.  Dispense: 60 tablet; Refill: 3  2. Secondary amenorrhea Per Dr 12/05/2019 last note 04/2022 - medroxyPROGESTERone (PROVERA) 10 MG tablet; Take daily for 1 week every 2 months  Dispense: 7 tablet; Refill: 5  3. Rash - clotrimazole-betamethasone (LOTRISONE) cream; Apply 1 Application topically 2 (two) times daily. X 3 weeks  Dispense: 45 g; Refill: 0    Return in about 6 months (around 01/15/2023) for PCP for chronic conditions.  The patient was given clear instructions to go to ER or return to medical center if symptoms don't improve, worsen or new problems develop. The patient verbalized understanding. The patient was told to call to get lab results if they haven't heard anything in the next week.      03/17/2023  Trena Platt [facility] [place], [place] [phone]   07/17/2022, 11:53 AM

## 2022-07-18 ENCOUNTER — Other Ambulatory Visit: Payer: Self-pay

## 2023-01-16 ENCOUNTER — Ambulatory Visit: Payer: Self-pay | Attending: Family Medicine | Admitting: Family Medicine

## 2023-01-16 ENCOUNTER — Other Ambulatory Visit: Payer: Self-pay

## 2023-01-16 ENCOUNTER — Encounter: Payer: Self-pay | Admitting: Family Medicine

## 2023-01-16 VITALS — BP 129/83 | HR 108 | Ht 65.0 in | Wt 321.2 lb

## 2023-01-16 DIAGNOSIS — N911 Secondary amenorrhea: Secondary | ICD-10-CM

## 2023-01-16 DIAGNOSIS — E669 Obesity, unspecified: Secondary | ICD-10-CM

## 2023-01-16 DIAGNOSIS — M940 Chondrocostal junction syndrome [Tietze]: Secondary | ICD-10-CM

## 2023-01-16 DIAGNOSIS — E1169 Type 2 diabetes mellitus with other specified complication: Secondary | ICD-10-CM | POA: Insufficient documentation

## 2023-01-16 DIAGNOSIS — Z1159 Encounter for screening for other viral diseases: Secondary | ICD-10-CM

## 2023-01-16 DIAGNOSIS — R600 Localized edema: Secondary | ICD-10-CM

## 2023-01-16 DIAGNOSIS — E119 Type 2 diabetes mellitus without complications: Secondary | ICD-10-CM | POA: Insufficient documentation

## 2023-01-16 LAB — POCT GLYCOSYLATED HEMOGLOBIN (HGB A1C): HbA1c, POC (controlled diabetic range): 6.6 % (ref 0.0–7.0)

## 2023-01-16 MED ORDER — TRUEPLUS LANCETS 28G MISC
1.0000 | Freq: Three times a day (TID) | 12 refills | Status: AC
  Filled 2023-01-16: qty 100, 33d supply, fill #0

## 2023-01-16 MED ORDER — OZEMPIC (0.25 OR 0.5 MG/DOSE) 2 MG/3ML ~~LOC~~ SOPN
0.2500 mg | PEN_INJECTOR | SUBCUTANEOUS | 6 refills | Status: DC
  Filled 2023-01-16: qty 2, 28d supply, fill #0
  Filled 2023-01-16: qty 3, 56d supply, fill #0
  Filled 2023-02-21: qty 3, 56d supply, fill #1
  Filled 2023-04-14 (×2): qty 3, 56d supply, fill #2

## 2023-01-16 MED ORDER — DICLOFENAC SODIUM 1 % EX GEL
4.0000 g | Freq: Two times a day (BID) | CUTANEOUS | 1 refills | Status: DC
  Filled 2023-01-16: qty 100, 13d supply, fill #0
  Filled 2023-06-03: qty 100, 13d supply, fill #1

## 2023-01-16 MED ORDER — TRUE METRIX METER W/DEVICE KIT
1.0000 | PACK | Freq: Every day | 0 refills | Status: AC
  Filled 2023-01-16: qty 1, 1d supply, fill #0

## 2023-01-16 MED ORDER — TRUE METRIX BLOOD GLUCOSE TEST VI STRP
ORAL_STRIP | 12 refills | Status: AC
  Filled 2023-01-16: qty 100, 33d supply, fill #0
  Filled 2023-06-03: qty 100, 33d supply, fill #1

## 2023-01-16 MED ORDER — ATORVASTATIN CALCIUM 20 MG PO TABS
20.0000 mg | ORAL_TABLET | Freq: Every day | ORAL | 1 refills | Status: DC
  Filled 2023-01-16: qty 90, 90d supply, fill #0

## 2023-01-16 NOTE — Progress Notes (Signed)
Subjective:  Patient ID: Ashley Grant, female    DOB: Oct 23, 1981  Age: 42 y.o. MRN: [id number]  CC: Prediabetes   HPI Ashley Grant is a 42 y.o. year old female with a history of Type 2 diabetes (A1c 6.6), obese obesity.  Interval History:  A1c is 6.6 up from 6.2 with a new diagnosis of Type 2 Diabetes. She endorses non adherence with Metformin.  Her stomach feels hard and she moves her bowels once to twice/day, denies being constipated and has no nausea, vomiting, diarrhea, pain. Also Complains of  discomfort in inframammary regions  She has had pedal edema and endorses ingesting a lot of sodium She has had irregular periods, LMP was 07/2022 and prior to that she had been amenorrheic x1. She did receive Provera in the past which she said brought about her menstruation and she would like a prescription for that. Past Medical History:  Diagnosis Date   Obesity (BMI 30-39.9)    PCOS (polycystic ovarian syndrome)     Past Surgical History:  Procedure Laterality Date   CESAREAN SECTION N/A 04/08/2014   Procedure: CESAREAN SECTION;  Surgeon: Woodroe Mode, MD;  Location: [facility];  Service: Obstetrics;  Laterality: N/A;    Family History  Problem Relation Age of Onset   Diabetes Father    Diabetes Maternal Grandfather    Cancer Paternal Grandfather     Social History   Socioeconomic History   Marital status: Single    Spouse name: Not on file   Number of children: Not on file   Years of education: Not on file   Highest education level: Not on file  Occupational History   Not on file  Tobacco Use   Smoking status: Never   Smokeless tobacco: Never  Vaping Use   Vaping Use: Never used  Substance and Sexual Activity   Alcohol use: No   Drug use: No   Sexual activity: Yes  Other Topics Concern   Not on file  Social History Narrative   ** Merged History Encounter **       Social Determinants of Health   Financial Resource Strain: Not  on file  Food Insecurity: No Food Insecurity (05/17/2020)   Hunger Vital Sign    Worried About Running Out of Food in the Last Year: Never true    Ran Out of Food in the Last Year: Never true  Transportation Needs: No Transportation Needs (05/17/2020)   PRAPARE - Hydrologist (Medical): No    Lack of Transportation (Non-Medical): No  Physical Activity: Not on file  Stress: Not on file  Social Connections: Not on file    No Known Allergies  Outpatient Medications Prior to Visit  Medication Sig Dispense Refill   metFORMIN (GLUCOPHAGE) 1000 MG tablet Take 1 tablet (1,000 mg total) by mouth 2 (two) times daily with a meal. 60 tablet 3   clotrimazole-betamethasone (LOTRISONE) cream Apply 1 Application topically 2 (two) times daily. X 3 weeks (Patient not taking: Reported on 01/16/2023) 45 g 0   medroxyPROGESTERone (PROVERA) 10 MG tablet Take daily for 1 week every 2 months (Patient not taking: Reported on 01/16/2023) 7 tablet 5   methocarbamol (ROBAXIN) 500 MG tablet Take 1 tablet (500 mg total) by mouth every 8 (eight) hours as needed for muscle spasms. (Patient not taking: Reported on 07/17/2022) 90 tablet 0   No facility-administered medications prior to visit.     ROS Review of Systems  Constitutional:  Negative for activity change and appetite change.  HENT:  Negative for sinus pressure and sore throat.   Respiratory:  Negative for chest tightness, shortness of breath and wheezing.   Cardiovascular:  Negative for chest pain and palpitations.  Gastrointestinal:  Negative for abdominal distention, abdominal pain and constipation.  Genitourinary:  Positive for menstrual problem.  Musculoskeletal: Negative.   Psychiatric/Behavioral:  Negative for behavioral problems and dysphoric mood.     Objective:  BP 129/83   Pulse (!) 108   Ht '5\' 5"'$  (1.651 m)   Wt (!) 321 lb 3.2 oz (145.7 kg)   SpO2 96%   BMI 53.45 kg/m      01/16/2023   11:43 AM 07/17/2022   11:28 AM  04/22/2022    4:29 PM  BP/Weight  Systolic BP [id number]  Diastolic BP 83 82 89  Wt. (Lbs) 321.2 298   BMI 53.45 kg/m2 49.59 kg/m2       Physical Exam Constitutional:      Appearance: She is well-developed. She is obese.  Cardiovascular:     Rate and Rhythm: Tachycardia present.     Heart sounds: Normal heart sounds. No murmur heard. Pulmonary:     Effort: Pulmonary effort is normal.     Breath sounds: Normal breath sounds. No wheezing or rales.  Chest:     Chest wall: Tenderness (Reproducible tenderness on palpation of bilateral subcostal region) present.  Abdominal:     General: Bowel sounds are normal. There is no distension.     Palpations: Abdomen is soft. There is no mass.     Tenderness: There is no abdominal tenderness.  Musculoskeletal:        General: Normal range of motion.     Right lower leg: No edema.     Left lower leg: No edema.  Neurological:     Mental Status: She is alert and oriented to person, place, and time.  Psychiatric:        Mood and Affect: Mood normal.        Latest Ref Rng & Units 07/11/2021   10:41 AM 11/14/2020   11:09 AM 04/27/2020    1:02 PM  CMP  Glucose 65 - 99 mg/dL 90  80  127   BUN 6 - 20 mg/dL '9  10  8   '$ Creatinine 0.57 - 1.00 mg/dL 0.83  0.69  0.72   Sodium 134 - 144 mmol/L 139  138  138   Potassium 3.5 - 5.2 mmol/L 4.3  4.3  3.6   Chloride 96 - 106 mmol/L 102  100  105   CO2 20 - 29 mmol/L '24  22  27   '$ Calcium 8.7 - 10.2 mg/dL 9.2  9.3  9.0   Total Protein 6.0 - 8.5 g/dL  7.2  6.9   Total Bilirubin 0.0 - 1.2 mg/dL  0.2  0.3   Alkaline Phos 44 - 121 IU/L  129  94   AST 0 - 40 IU/L  31  27   ALT 0 - 32 IU/L  38  38     Lipid Panel     Component Value Date/Time   CHOL 207 (H) 11/14/2020 1109   TRIG 163 (H) 11/14/2020 1109   HDL 30 (L) 11/14/2020 1109   CHOLHDL 6.9 (H) 11/14/2020 1109   LDLCALC 147 (H) 11/14/2020 1109    CBC    Component Value Date/Time   WBC 9.1 07/11/2021 1041   WBC 8.2 04/27/2020 1302  RBC  4.98 07/11/2021 1041   RBC 4.49 04/27/2020 1302   HGB 14.4 07/11/2021 1041   HCT 42.7 07/11/2021 1041   PLT 355 07/11/2021 1041   MCV 86 07/11/2021 1041   MCH 28.9 07/11/2021 1041   MCH 28.7 04/27/2020 1302   MCHC 33.7 07/11/2021 1041   MCHC 32.1 04/27/2020 1302   RDW 12.4 07/11/2021 1041   LYMPHSABS 3.0 07/11/2021 1041   MONOABS 350 04/01/2016 1505   EOSABS 0.1 07/11/2021 1041   BASOSABS 0.1 07/11/2021 1041    Lab Results  Component Value Date   HGBA1C 6.6 01/16/2023    Assessment & Plan:  1. Diabetes mellitus type 2 in obese [facility]) New diagnosis with A1c of 6.6 Counseled on pathophysiology of diabetes mellitus and diabetes associated care Due to additional weight loss benefit, after shared decision making we will place on Ozempic and we will titrate up as tolerated Metformin discontinued to prevent hypoglycemia Counseled on Diabetic diet, my plate method, [id number] minutes of moderate intensity exercise/week Blood sugar logs with fasting goals of 80-120 mg/dl, random of less than 180 and in the event of sugars less than 60 mg/dl or greater than 400 mg/dl encouraged to notify the clinic. Advised on the need for annual eye exams, annual foot exams, Pneumonia vaccine. - POCT glycosylated hemoglobin (Hb A1C) - atorvastatin (LIPITOR) 20 MG tablet; Take 1 tablet (20 mg total) by mouth daily.  Dispense: 90 tablet; Refill: 1 - Microalbumin / creatinine urine ratio - LP+Non-HDL Cholesterol - CMP14+EGFR - Semaglutide,0.25 or 0.'5MG'$ /DOS, (OZEMPIC, 0.25 OR 0.5 MG/DOSE,) 2 MG/3ML SOPN; Inject 0.25 mg into the skin once a week.  Dispense: 2 mL; Refill: 6 - glucose blood (TRUE METRIX BLOOD GLUCOSE TEST) test strip; daily  Dispense: 100 each; Refill: 12 - Blood Glucose Monitoring Suppl (TRUE METRIX METER) w/Device KIT; 1 each by Does not apply route daily.  Dispense: 1 kit; Refill: 0 - TRUEplus Lancets 28G MISC; 1 each by Does not apply route 3 (three) times daily before meals.  Dispense: 100 each;  Refill: 12  2. Need for hepatitis C screening test - HCV Ab w Reflex to Quant PCR  3. Costochondritis Counseled on pathophysiology of costochondritis - diclofenac Sodium (VOLTAREN) 1 % GEL; Apply 4 g topically in the morning and at bedtime.  Dispense: 100 g; Refill: 1  4. Secondary amenorrhea She possibly has PCOS Will order ultrasound to further evaluate - US Pelvic Complete With Transvaginal; Future  5. Pedal edema She endorses ingestion of high sodium foods Encouraged to comply with a low-sodium diet, elevate feet, use compression stockings     Meds ordered this encounter  Medications   atorvastatin (LIPITOR) 20 MG tablet    Sig: Take 1 tablet (20 mg total) by mouth daily.    Dispense:  90 tablet    Refill:  1   Semaglutide,0.25 or 0.'5MG'$ /DOS, (OZEMPIC, 0.25 OR 0.5 MG/DOSE,) 2 MG/3ML SOPN    Sig: Inject 0.25 mg into the skin once a week.    Dispense:  2 mL    Refill:  6   glucose blood (TRUE METRIX BLOOD GLUCOSE TEST) test strip    Sig: Testing three times daily before meals.    Dispense:  100 each    Refill:  12   Blood Glucose Monitoring Suppl (TRUE METRIX METER) w/Device KIT    Sig: Testing once daily.    Dispense:  1 kit    Refill:  0   TRUEplus Lancets 28G MISC    Sig:  Testing 3 (three) times daily before meals.    Dispense:  100 each    Refill:  12   diclofenac Sodium (VOLTAREN) 1 % GEL    Sig: Apply 4 g topically in the morning and at bedtime.    Dispense:  100 g    Refill:  1    Follow-up: Return in about 3 months (around 04/18/2023) for Chronic medical conditions.       Charlott Rakes, MD, FAAFP. [facility] [place], [place]   01/16/2023, 12:34 PM

## 2023-01-16 NOTE — Patient Instructions (Addendum)
Plan de accin para la diabetes mellitus Diabetes Mellitus Action Plan Seguir un plan de accin para la diabetes es una forma de controlar sus sntomas de diabetes (diabetes mellitus). El plan se codifica con colores para ayudarlo a comprender qu acciones necesita tomar en funcin de los sntomas que est teniendo. Si tiene sntomas pertenecientes a la zona roja, necesita buscar atencin mdica inmediatamente. Si tiene sntomas pertenecientes a la zona amarilla, est teniendo problemas. Si tiene sntomas pertenecientes a la zona verde, significa que se Brewing technologist. Aprender y comprender la diabetes puede Radiation protection practitioner. Siga el plan que elabor con el mdico. Conozca el rango deseado para su nivel de azcar en la sangre (glucosa), y revise su plan de tratamiento con su mdico en cada visita. El rango deseado para mi nivel de azcar en la sangre es __________________________ mg/dl. Zona roja Obtenga ayuda de inmediato si observa cualquiera de estos sntomas: Un resultado de azcar en la sangre que est por debajo de 54 mg/dl (3 mmol/l). Un nivel de azcar en la sangre mayor o igual que 240 mg/dl (13,3 mmol/l) durante 2 das seguidos. Confusin o dificultad para pensar con claridad. Dificultad para respirar. Malestar o fiebre durante 2 o ms [employer] no mejora. Niveles moderados o altos de cetonas en la [place]. Sentirse cansado o sin energa. Si tiene cualquiera de los sntomas pertenecientes a la zona roja, no espere para ver si desaparecen. Solicite atencin mdica de inmediato. Comunquese con el servicio de emergencias de su localidad (911 en los [place]). No conduzca por sus propios medios [employer]. Si tiene un nivel de azcar en la sangre muy bajo (hipoglucemia grave) y no puede ingerir ningn alimento ni bebida, tal vez necesite glucagn. Asegrese de que un familiar o amigo sepa controlarle el nivel de azcar en la sangre y aplicarle glucagn. Puede necesitar tratamiento en  un hospital para esta afeccin. Zona amarilla Si tiene alguno de los siguientes sntomas, su diabetes no est controlada y usted Product manager algunos cambios: Un nivel de azcar en la sangre mayor o igual que 240 mg/dl (13,3 mmol/l) durante 2 das seguidos. Un resultado de azcar en la sangre que est por debajo de 70 mg/dl (3,9 mmol/l). Otros sntomas de hipoglucemia, como: Temblores o sensacin de desvanecimiento. Confusin o irritabilidad. Sensacin de [place]. Latidos cardacos acelerados. Si tiene algn sntoma de la zona amarilla: Trate la hipoglucemia comiendo o bebiendo 15 gramos de hidratos de carbono de accin rpida. Siga las regla 15:15: Consuma 15 gramos de hidratos de carbono de accin rpida, como: 1 pomo de glucosa en gel. 4 comprimidos de glucosa. 4 onzas (120 ml) de jugo de frutas. 4 onzas (120 ml) de refresco comn (no diettico). Controle su nivel de azcar en la sangre 15 minutos despus de ingerir el hidrato de carbono. Si este nuevo nivel de azcar en la sangre todava es igual o menor que 70 mg/dl (3,9 mmol/l), ingiera nuevamente 15 gramos de un hidrato de carbono. Si el nivel de azcar en la sangre no supera los 70 mg/dl (3,9 mmol/l) despus de 3 intentos, solicite ayuda mdica de inmediato. Ingiera una comida o un refrigerio en el transcurso de 1 hora despus de que el nivel de azcar en la sangre se haya normalizado. Siga tomando los medicamentos diarios como se lo haya indicado el mdico. Controle su nivel de azcar en la sangre con ms frecuencia que lo hara normalmente. [employer]. Llame al mdico si tiene problemas para Advertising account executive de azcar  en la sangre dentro del rango deseado.  Zona verde Estos signos significan que se encuentra bien y puede continuar haciendo lo que est haciendo para Chief Technology Officer la diabetes: Su nivel de azcar en la sangre est en el rango deseado. En la [employer], el nivel de Location manager en la sangre antes de  una comida (preprandial) debera ser de 80 a 130 mg/dl (de 4.4 a 7.2 mmol/l). Se siente bien, y pueda volver a Cal[employer] Corporation diarias. Si se encuentra en la zona verde, contine controlando su diabetes como se lo haya indicado el mdico. Para hacer esto: Siga una dieta saludable. Haga ejercicio regularmente. Controle su nivel de azcar en la sangre como se lo haya indicado el mdico. Tome los medicamentos como se lo haya indicado el mdico.  Dnde buscar ms informacin American Diabetes Association (ADA) (Asociacin Estadounidense de la Diabetes): diabetes.org Association of Diabetes Care & Education Specialists (ADCES) (Asociacin de Especialistas en Atencin y Educacin sobre la Diabetes): diabeteseducator.org Resumen Seguir un plan de accin para la diabetes, es una forma de controlar sus sntomas de diabetes. El plan se codifica con colores para ayudarlo a comprender qu acciones necesita tomar en funcin de los sntomas que est teniendo. Siga el plan que elabor con el mdico. Asegrese de conocer su nivel deseado de azcar en la sangre. Revise el plan de tratamiento con el mdico en todas las consultas. Esta informacin no tiene Marine scientist el consejo del mdico. Asegrese de hacerle al mdico cualquier pregunta que tenga. Document Revised: 06/08/2020 Document Reviewed: 06/08/2020 Elsevier Patient Education  [place].

## 2023-01-18 LAB — CMP14+EGFR
ALT: 20 IU/L (ref 0–32)
AST: 18 IU/L (ref 0–40)
Albumin/Globulin Ratio: 1.6 (ref 1.2–2.2)
Albumin: 4.4 g/dL (ref 3.9–4.9)
Alkaline Phosphatase: 124 IU/L — ABNORMAL HIGH (ref 44–121)
BUN/Creatinine Ratio: 18 (ref 9–23)
BUN: 13 mg/dL (ref 6–24)
Bilirubin Total: 0.3 mg/dL (ref 0.0–1.2)
CO2: 22 mmol/L (ref 20–29)
Calcium: 9.2 mg/dL (ref 8.7–10.2)
Chloride: 102 mmol/L (ref 96–106)
Creatinine, Ser: 0.73 mg/dL (ref 0.57–1.00)
Globulin, Total: 2.8 g/dL (ref 1.5–4.5)
Glucose: 82 mg/dL (ref 70–99)
Potassium: 4.5 mmol/L (ref 3.5–5.2)
Sodium: 141 mmol/L (ref 134–144)
Total Protein: 7.2 g/dL (ref 6.0–8.5)
eGFR: 106 mL/min/{1.73_m2} (ref >59)

## 2023-01-18 LAB — LP+NON-HDL CHOLESTEROL
Cholesterol, Total: 221 mg/dL — ABNORMAL HIGH (ref 100–199)
HDL: 36 mg/dL — ABNORMAL LOW (ref >39)
LDL Chol Calc (NIH): 158 mg/dL — ABNORMAL HIGH (ref 0–99)
Total Non-HDL-Chol (LDL+VLDL): 185 mg/dL — ABNORMAL HIGH (ref 0–129)
Triglycerides: 148 mg/dL (ref 0–149)
VLDL Cholesterol Cal: 27 mg/dL (ref 5–40)

## 2023-01-18 LAB — MICROALBUMIN / CREATININE URINE RATIO
Creatinine, Urine: 336.3 mg/dL
Microalb/Creat Ratio: 15 mg/g creat (ref 0–29)
Microalbumin, Urine: 49 ug/mL

## 2023-01-27 ENCOUNTER — Other Ambulatory Visit: Payer: Self-pay

## 2023-01-28 ENCOUNTER — Ambulatory Visit (HOSPITAL_COMMUNITY): Payer: Self-pay

## 2023-01-31 ENCOUNTER — Ambulatory Visit: Payer: Self-pay

## 2023-01-31 NOTE — Telephone Encounter (Signed)
Answer Assessment - Initial Assessment Questions 1. CONCERN: "What happened that made you call today?"     Lack of energy 2. DEPRESSION SYMPTOM SCREENING: "How are you feeling overall?" (e.g., decreased energy, increased sleeping or difficulty sleeping, difficulty concentrating, feelings of sadness, guilt, hopelessness, or worthlessness)     No energy 3. RISK OF HARM - SUICIDAL IDEATION:  "Do you ever have thoughts of hurting or killing yourself?"  (e.g., yes, no, no but preoccupation with thoughts about death)   - INTENT:  "Do you have thoughts of hurting or killing yourself right NOW?" (e.g., yes, no, N/A)   - PLAN: "Do you have a specific plan for how you would do this?" (e.g., gun, knife, overdose, no plan, N/A)     No 4. RISK OF HARM - HOMICIDAL IDEATION:  "Do you ever have thoughts of hurting or killing someone else?"  (e.g., yes, no, no but preoccupation with thoughts about death)   - INTENT:  "Do you have thoughts of hurting or killing someone right NOW?" (e.g., yes, no, N/A)   - PLAN: "Do you have a specific plan for how you would do this?" (e.g., gun, knife, no plan, N/A)      No 5. FUNCTIONAL IMPAIRMENT: "How have things been going for you overall? Have you had more difficulty than usual doing your normal daily activities?"  (e.g., better, same, worse; self-care, school, work, interactions)     Same 6. SUPPORT: "Who is with you now?" "Who do you live with?" "Do you have family or friends who you can talk to?"      Family 7. THERAPIST: "Do you have a counselor or therapist? Name?"     No 8. STRESSORS: "Has there been any new stress or recent changes in your life?"     N/a 9. ALCOHOL USE OR SUBSTANCE USE (DRUG USE): "Do you drink alcohol or use any illegal drugs?"     No 10. OTHER: "Do you have any other physical symptoms right now?" (e.g., fever)       No motivation to exercise  11. PREGNANCY: "Is there any chance you are pregnant?" "When was your last menstrual period?"        No  Protocols used: Depression-A-AH

## 2023-01-31 NOTE — Telephone Encounter (Signed)
Pt requests that a nurse return her call because she has questions about medications that were prescribed. Cb# [phone]  Used Spanish interpreter Denny Peon [phone]. Chief Complaint: Pt. Asking about Ozempic and how many refills she has. Also has not heard about her appointment for her Ultrasound.Also feeling depressed as well and would like to see her about this. Symptoms: n/a Frequency: n/a Pertinent Negatives: Patient denies n/a Disposition: [] ED /[] Urgent Care (no appt availability in office) / [] Appointment(In office/virtual)/ []  [facility]/ [] Home Care/ [] Refused Recommended Disposition /[] [place] [facility]/ [x]  Follow-up with PCP Additional Notes:   Answer Assessment - Initial Assessment Questions 1. NAME of MEDICINE: "What medicine(s) are you calling about?"     Ozempic 2. QUESTION: "What is your question?" (e.g., double dose of medicine, side effect)     Dose 3. PRESCRIBER: "Who prescribed the medicine?" Reason: if prescribed by specialist, call should be referred to that group.     [place] 4. SYMPTOMS: "Do you have any symptoms?" If Yes, ask: "What symptoms are you having?"  "How bad are the symptoms (e.g., mild, moderate, severe)     N/a 5. PREGNANCY:  "Is there any chance that you are pregnant?" "When was your last menstrual period?"     No  Protocols used: Medication Question Call-A-AH

## 2023-02-03 ENCOUNTER — Other Ambulatory Visit: Payer: Self-pay

## 2023-02-03 NOTE — Telephone Encounter (Signed)
Call placed to patient unable to reach message left on VM via interpreter [phone]

## 2023-02-21 ENCOUNTER — Other Ambulatory Visit: Payer: Self-pay

## 2023-04-08 ENCOUNTER — Other Ambulatory Visit: Payer: Self-pay

## 2023-04-14 ENCOUNTER — Other Ambulatory Visit: Payer: Self-pay

## 2023-04-16 ENCOUNTER — Ambulatory Visit: Payer: Self-pay | Attending: Family Medicine | Admitting: Family Medicine

## 2023-04-16 ENCOUNTER — Other Ambulatory Visit: Payer: Self-pay

## 2023-04-16 VITALS — BP 111/81 | HR 89 | Ht 65.0 in | Wt 316.8 lb

## 2023-04-16 DIAGNOSIS — E119 Type 2 diabetes mellitus without complications: Secondary | ICD-10-CM

## 2023-04-16 DIAGNOSIS — N62 Hypertrophy of breast: Secondary | ICD-10-CM

## 2023-04-16 DIAGNOSIS — L249 Irritant contact dermatitis, unspecified cause: Secondary | ICD-10-CM

## 2023-04-16 DIAGNOSIS — Z7985 Long-term (current) use of injectable non-insulin antidiabetic drugs: Secondary | ICD-10-CM

## 2023-04-16 DIAGNOSIS — E1169 Type 2 diabetes mellitus with other specified complication: Secondary | ICD-10-CM

## 2023-04-16 DIAGNOSIS — E669 Obesity, unspecified: Secondary | ICD-10-CM

## 2023-04-16 LAB — POCT GLYCOSYLATED HEMOGLOBIN (HGB A1C): HbA1c, POC (controlled diabetic range): 6.2 % (ref 0.0–7.0)

## 2023-04-16 MED ORDER — HYDROCORTISONE 0.5 % EX CREA
1.0000 | TOPICAL_CREAM | Freq: Two times a day (BID) | CUTANEOUS | 1 refills | Status: AC
  Filled 2023-04-16: qty 30, 15d supply, fill #0

## 2023-04-16 MED ORDER — OZEMPIC (0.25 OR 0.5 MG/DOSE) 2 MG/3ML ~~LOC~~ SOPN
0.5000 mg | PEN_INJECTOR | SUBCUTANEOUS | 6 refills | Status: DC
  Filled 2023-04-16: qty 3, 28d supply, fill #0
  Filled 2023-06-03: qty 3, 28d supply, fill #1
  Filled 2023-07-11: qty 3, 28d supply, fill #2
  Filled 2023-08-08: qty 3, 28d supply, fill #3

## 2023-04-16 NOTE — Patient Instructions (Signed)
Dermatitis de contacto Contact Dermatitis Se habla de dermatitis cuando la piel se enrojece, se hincha y duele.  La dermatitis de contacto ocurre cuando el cuerpo reacciona a algo que toca la piel. Hay dos tipos: Dermatitis de contacto irritativa. En [place], algo [employer], [place]. Dermatitis de contacto alrgica. En este caso, la piel toca algo a lo que la persona es Counselling psychologist, como la hiedra venenosa. Cules son las causas? La dermatitis de contacto irritativa puede ser causada por: [place]. Jabones. Detergentes. Lavandina. cidos. Metales, como el nquel. La dermatitis de contacto alrgica puede ser causada por: Plantas. Productos qumicos. Alhajas. Ltex. Medicamentos. Conservantes. Son los componentes que se agregan a los productos para que duren ms tiempo en buen [place]. Puede haber conservantes en su ropa. Qu incrementa el riesgo? Tener un trabajo en el que debe estar cerca de cosas que le molestan a la piel. Tener asma o eczema. Cules son los signos o sntomas?  Piel seca o descamada. Enrojecimiento. Grietas. Picazn. Los sntomas moderados de esta afeccin incluyen lo siguiente: Dolor o sensacin de ardor. Ampollas. Sangre o lquido transparente que sale de las grietas de la piel. Hinchazn. Puede presentarse en los prpados, la boca o los genitales. Cmo se trata? El mdico averiguar qu hace que su piel reaccione. De esta forma, podr protegerse la piel. Es posible que deba usar: Ungentos, medicamentos o cremas con corticoesteroides. Antibiticos u otros ungentos, si tiene una infeccin en la piel. Lociones o medicamentos para Associate Professor. Una venda. Siga estas indicaciones en su casa: Cuidado de la piel Pngase crema humectante en la piel cuando lo necesite. Pngase paos frescos y hmedos (compresas fras) en la piel. Pngase una pasta de bicarbonato de [employer]. Agregue agua al bicarbonato de sodio hasta que  parezca una pasta. No se rasque la piel. Trate de que nada se frote contra su piel. Evite la ropa [place]. Evite el uso de [place], perfumes y tintes. Revsese la eBay para detectar signos de infeccin. Est atento a los siguientes signos: Aumento del enrojecimiento, la hinchazn o Chief Technology Officer. Ms lquido [place]. Calor. Pus o mal olor. Medicamentos [place], use o aplique los medicamentos de venta libre y los recetados solamente como se lo haya indicado el mdico. Si le recetaron antibiticos, tmelos o aplqueselos como se lo haya indicado el mdico. No deje de usarlos aunque comience a sentirse mejor. Baos Tome un bao con: Sales de [place]. Bicarbonato de sodio. Avena coloidal. Bese con menos frecuencia. Bese con agua tibia. Trate de no usar agua caliente. Cuidado de la venda Si le colocaron una venda, [place] segn se lo haya indicado el mdico. Lvese las manos con agua y jabn durante al menos 20 segundos antes y despus de cambiarse la venda. Use un desinfectante para manos si no dispone de [place] y [place]. Indicaciones generales Evite las cosas que le causaron la reaccin. Si no sabe qu la caus, lleve un diario. Escriba los siguientes datos: Lo que come. Los productos para la piel que [place]. Lo que bebe. La ropa que [place]. Comunquese con un mdico si: No mejora con el tratamiento. Empeora. Tiene signos de infeccin. Tiene fiebre. Aparecen nuevos sntomas. El hueso o la articulacin que se encuentran cerca de la zona le duelen despus de que la piel se [place]. Solicite ayuda de inmediato si: Nota unas lneas rojas en la piel que salen de la zona. La zona se oscurece. Tiene dificultad para respirar. Esta informacin no  tiene Theme park manager el consejo del mdico. Asegrese de hacerle al mdico cualquier pregunta que tenga. Document Revised: 05/21/2022 Document Reviewed: 05/21/2022 Elsevier Patient Education  2024 [employer].

## 2023-04-16 NOTE — Progress Notes (Signed)
Burn under right arm pit.

## 2023-04-16 NOTE — Progress Notes (Signed)
Subjective:  Patient ID: Ashley Grant, female    DOB: 1981/10/20  Age: 42 y.o. MRN: [id number]  CC: Diabetes   HPI Jeffrie Eusebio Me Shawnie Dapper is a 42 y.o. year old female with a history of Type 2 diabetes (A1c 6.2), obese obesity.   Interval History:  She has a reaction to a deodorant which she applied one month ago but sustained a reaction in left  armpit and this has been burning. She just used it once prior to reaction occurring. She applied topical creams which only worsened the symptoms .  For her diabetes she has been on Ozempic but has been administering 0.5 mg rather than 0.25 prescribed and then she was unable to receive additional refills from the pharmacy.  She has no neuropathy, visual concerns.  She Complains of her breast being too big and is wondering if she can receive a referral for breast reduction. Past Medical History:  Diagnosis Date   Obesity (BMI 30-39.9)    PCOS (polycystic ovarian syndrome)     Past Surgical History:  Procedure Laterality Date   CESAREAN SECTION N/A 04/08/2014   Procedure: CESAREAN SECTION;  Surgeon: Adam Phenix, MD;  Location: [facility];  Service: Obstetrics;  Laterality: N/A;    Family History  Problem Relation Age of Onset   Diabetes Father    Diabetes Maternal Grandfather    Cancer Paternal Grandfather     Social History   Socioeconomic History   Marital status: Single    Spouse name: Not on file   Number of children: Not on file   Years of education: Not on file   Highest education level: Not on file  Occupational History   Not on file  Tobacco Use   Smoking status: Never   Smokeless tobacco: Never  Vaping Use   Vaping Use: Never used  Substance and Sexual Activity   Alcohol use: No   Drug use: No   Sexual activity: Yes  Other Topics Concern   Not on file  Social History Narrative   ** Merged History Encounter **       Social Determinants of Health   Financial Resource Strain: Not on file   Food Insecurity: No Food Insecurity (05/17/2020)   Hunger Vital Sign    Worried About Running Out of Food in the Last Year: Never true    Ran Out of Food in the Last Year: Never true  Transportation Needs: No Transportation Needs (05/17/2020)   PRAPARE - Administrator, Civil Service (Medical): No    Lack of Transportation (Non-Medical): No  Physical Activity: Not on file  Stress: Not on file  Social Connections: Not on file    No Known Allergies  Outpatient Medications Prior to Visit  Medication Sig Dispense Refill   Blood Glucose Monitoring Suppl (TRUE METRIX METER) w/Device KIT Testing once daily. 1 kit 0   diclofenac Sodium (VOLTAREN) 1 % GEL Apply 4 g topically in the morning and at bedtime. 100 g 1   glucose blood (TRUE METRIX BLOOD GLUCOSE TEST) test strip Testing three times daily before meals. 100 each 12   TRUEplus Lancets 28G MISC Testing 3 (three) times daily before meals. 100 each 12   Semaglutide,0.25 or 0.5MG /DOS, (OZEMPIC, 0.25 OR 0.5 MG/DOSE,) 2 MG/3ML SOPN Inject 0.25 mg into the skin once a week. 2 mL 6   atorvastatin (LIPITOR) 20 MG tablet Take 1 tablet (20 mg total) by mouth daily. (Patient not taking: Reported on 04/16/2023)  90 tablet 1   No facility-administered medications prior to visit.     ROS Review of Systems  Constitutional:  Negative for activity change and appetite change.  HENT:  Negative for sinus pressure and sore throat.   Respiratory:  Negative for chest tightness, shortness of breath and wheezing.   Cardiovascular:  Negative for chest pain and palpitations.  Gastrointestinal:  Negative for abdominal distention, abdominal pain and constipation.  Genitourinary: Negative.   Musculoskeletal: Negative.   Skin:  Positive for rash.  Psychiatric/Behavioral:  Negative for behavioral problems and dysphoric mood.     Objective:  BP 111/81   Pulse 89   Ht 5\' 5"  (1.651 m)   Wt (!) 316 lb 12.8 oz (143.7 kg)   SpO2 96%   BMI 52.72 kg/m       04/16/2023   11:28 AM 01/16/2023   11:43 AM 07/17/2022   11:28 AM  BP/Weight  Systolic BP 111 129 121  Diastolic BP 81 83 82  Wt. (Lbs) 316.8 321.2 298  BMI 52.72 kg/m2 53.45 kg/m2 49.59 kg/m2      Physical Exam Constitutional:      Appearance: She is well-developed.  Cardiovascular:     Rate and Rhythm: Normal rate.     Heart sounds: Normal heart sounds. No murmur heard. Pulmonary:     Effort: Pulmonary effort is normal.     Breath sounds: Normal breath sounds. No wheezing or rales.  Chest:     Chest wall: No tenderness.  Abdominal:     General: Bowel sounds are normal. There is no distension.     Palpations: Abdomen is soft. There is no mass.     Tenderness: There is no abdominal tenderness.  Musculoskeletal:        General: Normal range of motion.     Right lower leg: No edema.     Left lower leg: No edema.  Skin:    Comments: Erythematous rash in left axilla  Neurological:     Mental Status: She is alert and oriented to person, place, and time.  Psychiatric:        Mood and Affect: Mood normal.        Latest Ref Rng & Units 01/16/2023   12:25 PM 07/11/2021   10:41 AM 11/14/2020   11:09 AM  CMP  Glucose 70 - 99 mg/dL 82  90  80   BUN 6 - 24 mg/dL 13  9  10    Creatinine 0.57 - 1.00 mg/dL 1.61  0.96  0.45   Sodium 134 - 144 mmol/L 141  139  138   Potassium 3.5 - 5.2 mmol/L 4.5  4.3  4.3   Chloride 96 - 106 mmol/L 102  102  100   CO2 20 - 29 mmol/L 22  24  22    Calcium 8.7 - 10.2 mg/dL 9.2  9.2  9.3   Total Protein 6.0 - 8.5 g/dL 7.2   7.2   Total Bilirubin 0.0 - 1.2 mg/dL 0.3   0.2   Alkaline Phos 44 - 121 IU/L 124   129   AST 0 - 40 IU/L 18   31   ALT 0 - 32 IU/L 20   38     Lipid Panel     Component Value Date/Time   CHOL 221 (H) 01/16/2023 1225   TRIG 148 01/16/2023 1225   HDL 36 (L) 01/16/2023 1225   CHOLHDL 6.9 (H) 11/14/2020 1109   LDLCALC 158 (H) 01/16/2023 1225  CBC    Component Value Date/Time   WBC 9.1 07/11/2021 1041   WBC 8.2  04/27/2020 1302   RBC 4.98 07/11/2021 1041   RBC 4.49 04/27/2020 1302   HGB 14.4 07/11/2021 1041   HCT 42.7 07/11/2021 1041   PLT 355 07/11/2021 1041   MCV 86 07/11/2021 1041   MCH 28.9 07/11/2021 1041   MCH 28.7 04/27/2020 1302   MCHC 33.7 07/11/2021 1041   MCHC 32.1 04/27/2020 1302   RDW 12.4 07/11/2021 1041   LYMPHSABS 3.0 07/11/2021 1041   MONOABS 350 04/01/2016 1505   EOSABS 0.1 07/11/2021 1041   BASOSABS 0.1 07/11/2021 1041    Lab Results  Component Value Date   HGBA1C 6.2 04/16/2023    Assessment & Plan:  1. Type 2 diabetes mellitus with obesity (HCC) Full with A1c of 6.2 I have checked with the pharmacy and it appears she had been administering 0.5 rather than 0.25 prescribed and then ran out prematurely Will increase Ozempic to 0.5 mg.  Will exercise caution to prevent hypoglycemia Increased Ozempic from 0.25 mg to 0.5 mg. Counseled on Diabetic diet, my plate method, 161 minutes of moderate intensity exercise/week Blood sugar logs with fasting goals of 80-120 mg/dl, random of less than 096 and in the event of sugars less than 60 mg/dl or greater than 045 mg/dl encouraged to notify the clinic. Advised on the need for annual eye exams, annual foot exams, Pneumonia vaccine. - POCT glycosylated hemoglobin (Hb A1C) - Semaglutide,0.25 or 0.5MG /DOS, (OZEMPIC, 0.25 OR 0.5 MG/DOSE,) 2 MG/3ML SOPN; Inject 0.5 mg into the skin once a week.  Dispense: 2 mL; Refill: 6  2. Acute irritant contact dermatitis Advised against using the deodorant that caused the reaction - hydrocortisone cream 0.5 %; Apply 1 Application topically 2 (two) times daily.  Dispense: 30 g; Refill: 1  3. Large breasts Unfortunately she has no medical coverage and referral will be declined by plastic surgery.  Discussed with her that she needs to apply for Medicaid to facilitate referral   Meds ordered this encounter  Medications   Semaglutide,0.25 or 0.5MG /DOS, (OZEMPIC, 0.25 OR 0.5 MG/DOSE,) 2 MG/3ML  SOPN    Sig: Inject 0.5 mg into the skin once a week.    Dispense:  2 mL    Refill:  6    Dose increase   hydrocortisone cream 0.5 %    Sig: Apply 1 Application topically 2 (two) times daily.    Dispense:  30 g    Refill:  1    Follow-up: Return in about 6 months (around 10/16/2023) for Chronic medical conditions.       Hoy Register, MD, FAAFP. [facility] [place], [place] [phone]   04/16/2023, 12:09 PM

## 2023-04-22 ENCOUNTER — Other Ambulatory Visit: Payer: Self-pay

## 2023-04-25 ENCOUNTER — Other Ambulatory Visit: Payer: Self-pay

## 2023-05-05 ENCOUNTER — Other Ambulatory Visit: Payer: Self-pay

## 2023-06-03 ENCOUNTER — Other Ambulatory Visit: Payer: Self-pay

## 2023-06-13 ENCOUNTER — Other Ambulatory Visit: Payer: Self-pay

## 2023-07-11 ENCOUNTER — Other Ambulatory Visit: Payer: Self-pay

## 2023-08-08 ENCOUNTER — Other Ambulatory Visit: Payer: Self-pay | Admitting: Family Medicine

## 2023-08-08 ENCOUNTER — Other Ambulatory Visit: Payer: Self-pay

## 2023-08-08 DIAGNOSIS — E1169 Type 2 diabetes mellitus with other specified complication: Secondary | ICD-10-CM

## 2023-08-08 NOTE — Telephone Encounter (Signed)
Requested medications are due for refill today.  yes  Requested medications are on the active medications list.  yes  Last refill. 04/16/2023   Future visit scheduled.   no  Notes to clinic.  Pharmacy comment: **somehow pharmacy messed up written qty, resend w/written quantity 3ml?    Requested Prescriptions  Pending Prescriptions Disp Refills   Semaglutide,0.25 or 0.5MG /DOS, (OZEMPIC, 0.25 OR 0.5 MG/DOSE,) 2 MG/3ML SOPN 2 mL 6    Sig: Inject 0.5 mg into the skin once a week.     Endocrinology:  Diabetes - GLP-1 Receptor Agonists - semaglutide Passed - 08/08/2023 10:48 AM      Passed - HBA1C in normal range and within 180 days    HbA1c, POC (prediabetic range)  Date Value Ref Range Status  12/03/2019 5.9 5.7 - 6.4 % Final   HbA1c, POC (controlled diabetic range)  Date Value Ref Range Status  04/16/2023 6.2 0.0 - 7.0 % Final         Passed - Cr in normal range and within 360 days    Creat  Date Value Ref Range Status  04/01/2016 0.73 0.50 - 1.10 mg/dL Final   Creatinine, Ser  Date Value Ref Range Status  01/16/2023 0.73 0.57 - 1.00 mg/dL Final   Creatinine, Urine  Date Value Ref Range Status  04/06/2014 170.33 mg/dL Final         Passed - Valid encounter within last 6 months    Recent Outpatient Visits           3 months ago Type 2 diabetes mellitus with obesity (HCC)   [facility] [place], Odette Horns, MD   6 months ago Diabetes mellitus type 2 in obese [facility])   [facility] [place], Odette Horns, MD   1 year ago Prediabetes   [facility] [place], [place], [place]   1 year ago Secondary amenorrhea   [facility] Marcine Matar, MD   2 years ago Localized swelling, mass and lump, neck   [facility] [place], [place], [place]

## 2023-09-05 ENCOUNTER — Other Ambulatory Visit: Payer: Self-pay | Admitting: Pharmacist

## 2023-09-05 ENCOUNTER — Other Ambulatory Visit: Payer: Self-pay

## 2023-09-05 DIAGNOSIS — E1169 Type 2 diabetes mellitus with other specified complication: Secondary | ICD-10-CM

## 2023-09-05 MED ORDER — OZEMPIC (0.25 OR 0.5 MG/DOSE) 2 MG/3ML ~~LOC~~ SOPN
0.5000 mg | PEN_INJECTOR | SUBCUTANEOUS | 0 refills | Status: DC
  Filled 2023-09-05: qty 3, 28d supply, fill #0

## 2023-09-29 ENCOUNTER — Other Ambulatory Visit: Payer: Self-pay | Admitting: Family Medicine

## 2023-09-29 ENCOUNTER — Other Ambulatory Visit: Payer: Self-pay

## 2023-09-29 DIAGNOSIS — E669 Obesity, unspecified: Secondary | ICD-10-CM

## 2023-09-30 ENCOUNTER — Other Ambulatory Visit: Payer: Self-pay

## 2023-09-30 MED ORDER — OZEMPIC (0.25 OR 0.5 MG/DOSE) 2 MG/3ML ~~LOC~~ SOPN
0.5000 mg | PEN_INJECTOR | SUBCUTANEOUS | 0 refills | Status: DC
  Filled 2023-09-30: qty 3, 28d supply, fill #0

## 2023-09-30 NOTE — Telephone Encounter (Signed)
Requested Prescriptions  Pending Prescriptions Disp Refills   Semaglutide,0.25 or 0.5MG /DOS, (OZEMPIC, 0.25 OR 0.5 MG/DOSE,) 2 MG/3ML SOPN 3 mL 0    Sig: Inject 0.5 mg into the skin once a week.     Endocrinology:  Diabetes - GLP-1 Receptor Agonists - semaglutide Passed - 09/29/2023  3:27 PM      Passed - HBA1C in normal range and within 180 days    HbA1c, POC (prediabetic range)  Date Value Ref Range Status  12/03/2019 5.9 5.7 - 6.4 % Final   HbA1c, POC (controlled diabetic range)  Date Value Ref Range Status  04/16/2023 6.2 0.0 - 7.0 % Final         Passed - Cr in normal range and within 360 days    Creat  Date Value Ref Range Status  04/01/2016 0.73 0.50 - 1.10 mg/dL Final   Creatinine, Ser  Date Value Ref Range Status  01/16/2023 0.73 0.57 - 1.00 mg/dL Final   Creatinine, Urine  Date Value Ref Range Status  04/06/2014 170.33 mg/dL Final         Passed - Valid encounter within last 6 months    Recent Outpatient Visits           5 months ago Type 2 diabetes mellitus with obesity (HCC)   [facility] A Dept Of [place]. [facility] Hoy Register, MD   8 months ago Diabetes mellitus type 2 in obese [facility])   [facility] - A Dept Of [place]. [facility] Hoy Register, MD   1 year ago Prediabetes   [facility] - A Dept Of [place]. [facility] [place], [name], [place]   1 year ago Secondary amenorrhea   [facility] [place] - A Dept Of [place]. [facility] Marcine Matar, MD   2 years ago Localized swelling, mass and lump, neck   [facility] - A Dept Of [place]. [facility] [place], [name], [place]       Future Appointments             In 2 weeks Hoy Register, MD [facility] - A Dept Of Eligha Bridegroom. [facility] - [facility]

## 2023-10-10 ENCOUNTER — Other Ambulatory Visit: Payer: Self-pay

## 2023-10-15 ENCOUNTER — Other Ambulatory Visit: Payer: Self-pay

## 2023-10-15 ENCOUNTER — Encounter: Payer: Self-pay | Admitting: Family Medicine

## 2023-10-15 ENCOUNTER — Ambulatory Visit: Payer: Self-pay | Attending: Family Medicine | Admitting: Family Medicine

## 2023-10-15 VITALS — BP 138/88 | HR 90 | Ht 65.0 in | Wt 319.2 lb

## 2023-10-15 DIAGNOSIS — N911 Secondary amenorrhea: Secondary | ICD-10-CM

## 2023-10-15 DIAGNOSIS — E1169 Type 2 diabetes mellitus with other specified complication: Secondary | ICD-10-CM

## 2023-10-15 DIAGNOSIS — Z7985 Long-term (current) use of injectable non-insulin antidiabetic drugs: Secondary | ICD-10-CM

## 2023-10-15 DIAGNOSIS — Z1159 Encounter for screening for other viral diseases: Secondary | ICD-10-CM

## 2023-10-15 DIAGNOSIS — Z1231 Encounter for screening mammogram for malignant neoplasm of breast: Secondary | ICD-10-CM

## 2023-10-15 LAB — POCT GLYCOSYLATED HEMOGLOBIN (HGB A1C): HbA1c, POC (controlled diabetic range): 5.9 % (ref 0.0–7.0)

## 2023-10-15 MED ORDER — SEMAGLUTIDE (1 MG/DOSE) 4 MG/3ML ~~LOC~~ SOPN
1.0000 mg | PEN_INJECTOR | SUBCUTANEOUS | 3 refills | Status: DC
  Filled 2023-10-15: qty 3, 28d supply, fill #0
  Filled 2023-11-13: qty 3, 28d supply, fill #1
  Filled 2023-12-05: qty 3, 28d supply, fill #2
  Filled 2024-01-28: qty 3, 28d supply, fill #3

## 2023-10-15 MED ORDER — ATORVASTATIN CALCIUM 20 MG PO TABS
20.0000 mg | ORAL_TABLET | Freq: Every day | ORAL | 1 refills | Status: DC
  Filled 2023-10-15: qty 90, 90d supply, fill #0
  Filled 2023-12-05: qty 30, 30d supply, fill #1

## 2023-10-15 NOTE — Patient Instructions (Signed)
Amenorrea secundaria Secondary Amenorrhea  La amenorrea secundaria ocurre cuando una mujer que previamente tena perodos menstruales deja de tenerlos durante un perodo de 3 a 6 meses. Un perodo menstrual es el desprendimiento mensual del revestimiento del tero. El revestimiento del tero est compuesto por sangre, tejido, lquido y mucosidad. El flujo de sangre suele durar de 3 a 7 das [employer]. Esta afeccin tiene muchas causas. En muchos casos, el tratamiento de la causa preexistente har que los perodos menstruales regresen al ciclo normal. Cules son las causas? La causa ms frecuente de esta afeccin es el [place]. Otras afecciones mdicas que pueden causar amenorrea secundaria incluyen las siguientes: Cirrosis en el hgado. Afecciones de [employer]. Diabetes. Epilepsia. Enfermedad renal crnica. Enfermedad poliqustica de los ovarios. Desequilibrios hormonales. Insuficiencia ovrica. Fibrosis qustica. Menopausia precoz. Sndrome de [place]. Problemas de tiroides. Algunas otras causas son las siguientes: Desnutricin. Ansiedad o estrs. Medicamentos. Obesidad extrema. Peso corporal bajo o descenso de peso drstico. Extirpacin de los ovarios o el tero. Pldoras, parches o anillos vaginales anticonceptivos. Qu incrementa el riesgo? Es ms probable que sufra esta afeccin si: Tiene antecedentes familiares de esta afeccin. Sufre un trastorno alimentario. Realiza entrenamiento deportivo extremo. Tiene una enfermedad crnica. Consume ciertas sustancias de manera excesiva, como alcohol o cigarrillos. Cules son los signos o sntomas? El principal sntoma de esta afeccin es la ausencia de perodos menstruales durante un perodo de 3 a 6 meses en una mujer que previamente los [place]. Cmo se diagnostica? Esta afeccin se puede diagnosticar en funcin de lo siguiente: Sus antecedentes mdicos. Un examen fsico. Un examen plvico para detectar  problemas en los rganos genitales. Un procedimiento para examinar el tero. Una medicin del ndice de masa corporal [facility]). Tambin pueden hacerle [employer], incluidas las siguientes: Anlisis de [place] para determinar los niveles de ciertas hormonas del organismo y Sales promotion account executive un Psychiatrist. Anlisis de [place]. Estudios de diagnstico por imgenes, como ecografa, exploracin por tomografa computarizada (TC) o Health visitor (RM). Cmo se trata? El tratamiento de esta afeccin de su causa. Este puede incluir lo siguiente: Corregir problemas relacionados con la dieta. Tratar las afecciones preexistentes. Medicamentos. Cambios en el estilo de vida. Ciruga. Si el cuadro clnico no puede corregirse, Radiographer, therapeutic es posible iniciar los perodos menstruales con medicamentos. Siga estas instrucciones en su casa: Estilo de vida     Siga una dieta saludable. En general, una dieta saludable incluye muchas frutas y verduras, productos lcteos descremados, carnes magras y alimentos que [place]. Pida una consulta con un nutricionista para obtener asesoramiento nutricional y Marine scientist las comidas. Mantenga un peso saludable. Hable con el mdico antes de probar una dieta nueva o un plan nuevo de actividad fsica. Haga al menos 30 minutos de ejercicio 5 o ms [address]. Hacer ejercicio incluye caminar rpido, trabajar en el jardn, andar en bicicleta, correr, nadar y practicar deportes en equipo como baloncesto y ftbol. Pregunte al mdico qu ejercicios son seguros para usted. Duerma lo suficiente. Organcese para dormir entre 7 y 9 horas cada noche. Aprenda a Dealer. Practique tcnicas de relajacin, como meditacin, llevar un diario, yoga o tai chi. Instrucciones generales Observe si hay cambios en el ciclo menstrual. Lleve un registro de cundo ocurren los perodos [place]. Anote la fecha de inicio de los perodos, cunto duran y los problemas que tenga. Use  los medicamentos de venta libre y los recetados solamente como se lo haya indicado el mdico. Cumpla con todas las visitas de seguimiento. Esto es  importante. Comunquese con un mdico si: Los perodos menstruales no vuelven a la normalidad despus del tratamiento. Resumen La amenorrea secundaria ocurre cuando una mujer que previamente tena perodos menstruales deja de tenerlos durante un perodo de 3 a 6 meses. Esta afeccin tiene muchas causas. En muchos casos, el tratamiento de la causa preexistente har que los perodos menstruales regresen al ciclo normal. Hable con el mdico si los perodos menstruales no vuelven a la normalidad despus del [place]. Esta informacin no tiene Theme park manager el consejo del mdico. Asegrese de hacerle al mdico cualquier pregunta que tenga. Document Revised: 09/08/2020 Document Reviewed: 09/08/2020 Elsevier Patient Education  2024 [employer].

## 2023-10-15 NOTE — Progress Notes (Signed)
Subjective:  Patient ID: Ashley Grant, female    DOB: 11/24/1980  Age: 42 y.o. MRN: [id number]  CC: Medical Management of Chronic Issues (Bites her tongue in her sleep/Irregular cycles.)   HPI Ashley Grant is a 42 y.o. year old female with a history of Type 2 diabetes (A1c 6.2), obese obesity.   Interval History: Discussed the use of AI scribe software for clinical note transcription with the patient, who gave verbal consent to proceed.  She presents with concerns about weight loss while on Ozempic. She reports that despite being on the medication, she has not lost any weight. She also expresses concerns about the injection technique, stating that her stomach becomes hard and inflamed after the injection. She has been alternating injection sites. In addition, the patient reports cessation of menstrual periods since January of the current year. She was given medication for this issue in the last year, which resulted in a period, but has not had a period since then.  Review of her chart indicates she received Provera from Dr. Laural Benes She was scheduled for an ultrasound to evaluate this issue but has not yet had it done.  The patient also has high cholesterol but is not currently taking the prescribed atorvastatin.       Past Medical History:  Diagnosis Date   Obesity (BMI 30-39.9)    PCOS (polycystic ovarian syndrome)     Past Surgical History:  Procedure Laterality Date   CESAREAN SECTION N/A 04/08/2014   Procedure: CESAREAN SECTION;  Surgeon: Adam Phenix, MD;  Location: [facility];  Service: Obstetrics;  Laterality: N/A;    Family History  Problem Relation Age of Onset   Diabetes Father    Diabetes Maternal Grandfather    Cancer Paternal Grandfather     Social History   Socioeconomic History   Marital status: Single    Spouse name: Not on file   Number of children: Not on file   Years of education: Not on file   Highest education level: Not  on file  Occupational History   Not on file  Tobacco Use   Smoking status: Never   Smokeless tobacco: Never  Vaping Use   Vaping status: Never Used  Substance and Sexual Activity   Alcohol use: No   Drug use: No   Sexual activity: Yes  Other Topics Concern   Not on file  Social History Narrative   ** Merged History Encounter **       Social Determinants of Health   Financial Resource Strain: Medium Risk (10/15/2023)   Overall Financial Resource Strain (CARDIA)    Difficulty of Paying Living Expenses: Somewhat hard  Food Insecurity: No Food Insecurity (10/15/2023)   Hunger Vital Sign    Worried About Running Out of Food in the Last Year: Never true    Ran Out of Food in the Last Year: Never true  Transportation Needs: No Transportation Needs (10/15/2023)   PRAPARE - Administrator, Civil Service (Medical): No    Lack of Transportation (Non-Medical): No  Physical Activity: Insufficiently Active (10/15/2023)   Exercise Vital Sign    Days of Exercise per Week: 2 days    Minutes of Exercise per Session: 60 min  Stress: No Stress Concern Present (10/15/2023)   [employer] of Occupational Health - Occupational Stress Questionnaire    Feeling of Stress : Not at all  Social Connections: Unknown (10/15/2023)   Social Connection and Isolation Panel [NHANES]  Frequency of Communication with Friends and Family: Twice a week    Frequency of Social Gatherings with Friends and Family: Once a week    Attends Religious Services: More than 4 times per year    Active Member of [employer] or Organizations: No    Attends Engineer, structural: More than 4 times per year    Marital Status: Patient declined    No Known Allergies  Outpatient Medications Prior to Visit  Medication Sig Dispense Refill   Blood Glucose Monitoring Suppl (TRUE METRIX METER) w/Device KIT Testing once daily. 1 kit 0   diclofenac Sodium (VOLTAREN) 1 % GEL Apply 4 g topically in the morning and at  bedtime. 100 g 1   glucose blood (TRUE METRIX BLOOD GLUCOSE TEST) test strip Testing three times daily before meals. 100 each 12   hydrocortisone cream 0.5 % Apply 1 Application topically 2 (two) times daily. 30 g 1   TRUEplus Lancets 28G MISC Testing 3 (three) times daily before meals. 100 each 12   atorvastatin (LIPITOR) 20 MG tablet Take 1 tablet (20 mg total) by mouth daily. 90 tablet 1   Semaglutide,0.25 or 0.5MG /DOS, (OZEMPIC, 0.25 OR 0.5 MG/DOSE,) 2 MG/3ML SOPN Inject 0.5 mg into the skin once a week. 3 mL 0   No facility-administered medications prior to visit.     ROS Review of Systems  Constitutional:  Negative for activity change and appetite change.  HENT:  Negative for sinus pressure and sore throat.   Respiratory:  Negative for chest tightness, shortness of breath and wheezing.   Cardiovascular:  Negative for chest pain and palpitations.  Gastrointestinal:  Negative for abdominal distention, abdominal pain and constipation.  Genitourinary:  Positive for menstrual problem.  Musculoskeletal: Negative.   Psychiatric/Behavioral:  Negative for behavioral problems and dysphoric mood.     Objective:  BP 138/88   Pulse 90   Ht 5\' 5"  (1.651 m)   Wt (!) 319 lb 3.2 oz (144.8 kg)   SpO2 97%   BMI 53.12 kg/m      10/15/2023    3:18 PM 04/16/2023   11:28 AM 01/16/2023   11:43 AM  BP/Weight  Systolic BP 138 111 129  Diastolic BP 88 81 83  Wt. (Lbs) 319.2 316.8 321.2  BMI 53.12 kg/m2 52.72 kg/m2 53.45 kg/m2    Wt Readings from Last 3 Encounters:  10/15/23 (!) 319 lb 3.2 oz (144.8 kg)  04/16/23 (!) 316 lb 12.8 oz (143.7 kg)  01/16/23 (!) 321 lb 3.2 oz (145.7 kg)      Physical Exam Constitutional:      Appearance: She is well-developed.  Cardiovascular:     Rate and Rhythm: Normal rate.     Heart sounds: Normal heart sounds. No murmur heard. Pulmonary:     Effort: Pulmonary effort is normal.     Breath sounds: Normal breath sounds. No wheezing or rales.  Chest:      Chest wall: No tenderness.  Abdominal:     General: Bowel sounds are normal. There is no distension.     Palpations: Abdomen is soft. There is no mass.     Tenderness: There is no abdominal tenderness.  Musculoskeletal:        General: Normal range of motion.     Right lower leg: No edema.     Left lower leg: No edema.  Neurological:     Mental Status: She is alert and oriented to person, place, and time.  Psychiatric:  Mood and Affect: Mood normal.        Latest Ref Rng & Units 01/16/2023   12:25 PM 07/11/2021   10:41 AM 11/14/2020   11:09 AM  CMP  Glucose 70 - 99 mg/dL 82  90  80   BUN 6 - 24 mg/dL 13  9  10    Creatinine 0.57 - 1.00 mg/dL 6.29  5.28  4.13   Sodium 134 - 144 mmol/L 141  139  138   Potassium 3.5 - 5.2 mmol/L 4.5  4.3  4.3   Chloride 96 - 106 mmol/L 102  102  100   CO2 20 - 29 mmol/L 22  24  22    Calcium 8.7 - 10.2 mg/dL 9.2  9.2  9.3   Total Protein 6.0 - 8.5 g/dL 7.2   7.2   Total Bilirubin 0.0 - 1.2 mg/dL 0.3   0.2   Alkaline Phos 44 - 121 IU/L 124   129   AST 0 - 40 IU/L 18   31   ALT 0 - 32 IU/L 20   38     Lipid Panel     Component Value Date/Time   CHOL 221 (H) 01/16/2023 1225   TRIG 148 01/16/2023 1225   HDL 36 (L) 01/16/2023 1225   CHOLHDL 6.9 (H) 11/14/2020 1109   LDLCALC 158 (H) 01/16/2023 1225    CBC    Component Value Date/Time   WBC 9.1 07/11/2021 1041   WBC 8.2 04/27/2020 1302   RBC 4.98 07/11/2021 1041   RBC 4.49 04/27/2020 1302   HGB 14.4 07/11/2021 1041   HCT 42.7 07/11/2021 1041   PLT 355 07/11/2021 1041   MCV 86 07/11/2021 1041   MCH 28.9 07/11/2021 1041   MCH 28.7 04/27/2020 1302   MCHC 33.7 07/11/2021 1041   MCHC 32.1 04/27/2020 1302   RDW 12.4 07/11/2021 1041   LYMPHSABS 3.0 07/11/2021 1041   MONOABS 350 04/01/2016 1505   EOSABS 0.1 07/11/2021 1041   BASOSABS 0.1 07/11/2021 1041    Lab Results  Component Value Date   HGBA1C 5.9 10/15/2023    Assessment & Plan:      Type 2 Diabetes Mellitus Well  controlled with A1c of 5.9. Patient on Ozempic 0.5mg  with some weight loss but less than expected. Patient reports hardening of abdomen after injection, possibly due to incorrect injection technique. -Increase Ozempic to 1mg  to promote further weight loss. -Refer to pharmacist for proper injection technique training.   Hyperlipidemia Patient not taking prescribed atorvastatin. -Advise patient to restart atorvastatin for cardiovascular risk reduction. -Plan to check lipid panel at next visit.  Secondary amenorrhea Patient reports no menstrual cycle for the past year. Previous ultrasound ordered but not completed. -Order pelvic ultrasound to evaluate cause of amenorrhea. -Possible referral to gynecology depending on ultrasound results.  General Health Maintenance -Order mammogram for routine breast cancer screening.          Meds ordered this encounter  Medications   Semaglutide, 1 MG/DOSE, 4 MG/3ML SOPN    Sig: Inject 1 mg as directed once a week.    Dispense:  3 mL    Refill:  3    Discontinue previous dose   atorvastatin (LIPITOR) 20 MG tablet    Sig: Take 1 tablet (20 mg total) by mouth daily.    Dispense:  90 tablet    Refill:  1    Follow-up: Return in about 6 months (around 04/14/2024) for Chronic medical conditions.  Hoy Register, MD, FAAFP. [facility] [place], [place] [phone]   10/15/2023, 5:40 PM

## 2023-10-16 LAB — BASIC METABOLIC PANEL
BUN/Creatinine Ratio: 16 (ref 9–23)
BUN: 12 mg/dL (ref 6–24)
CO2: 25 mmol/L (ref 20–29)
Calcium: 9.6 mg/dL (ref 8.7–10.2)
Chloride: 101 mmol/L (ref 96–106)
Creatinine, Ser: 0.77 mg/dL (ref 0.57–1.00)
Glucose: 87 mg/dL (ref 70–99)
Potassium: 4.3 mmol/L (ref 3.5–5.2)
Sodium: 141 mmol/L (ref 134–144)
eGFR: 99 mL/min/{1.73_m2} (ref >59)

## 2023-10-16 LAB — HCV INTERPRETATION

## 2023-10-16 LAB — HCV AB W REFLEX TO QUANT PCR: HCV Ab: NONREACTIVE

## 2023-10-27 ENCOUNTER — Other Ambulatory Visit: Payer: Self-pay

## 2023-11-07 ENCOUNTER — Ambulatory Visit (HOSPITAL_COMMUNITY)
Admission: RE | Admit: 2023-11-07 | Discharge: 2023-11-07 | Disposition: A | Discharge status: Home / Self Care | Payer: Self-pay | Source: Ambulatory Visit | Attending: Family Medicine | Admitting: Family Medicine

## 2023-11-07 DIAGNOSIS — N911 Secondary amenorrhea: Secondary | ICD-10-CM | POA: Insufficient documentation

## 2023-11-13 ENCOUNTER — Other Ambulatory Visit: Payer: Self-pay

## 2023-12-05 ENCOUNTER — Other Ambulatory Visit: Payer: Self-pay

## 2024-01-28 ENCOUNTER — Other Ambulatory Visit: Payer: Self-pay | Admitting: Family Medicine

## 2024-01-28 ENCOUNTER — Other Ambulatory Visit: Payer: Self-pay

## 2024-01-28 DIAGNOSIS — M940 Chondrocostal junction syndrome [Tietze]: Secondary | ICD-10-CM

## 2024-01-28 MED ORDER — DICLOFENAC SODIUM 1 % EX GEL
4.0000 g | Freq: Two times a day (BID) | CUTANEOUS | 1 refills | Status: DC
Start: 2024-01-28 — End: 2024-04-14
  Filled 2024-01-28 – 2024-02-23 (×2): qty 100, 13d supply, fill #0

## 2024-02-05 ENCOUNTER — Other Ambulatory Visit: Payer: Self-pay

## 2024-02-23 ENCOUNTER — Telehealth: Payer: Self-pay | Admitting: *Deleted

## 2024-02-23 ENCOUNTER — Other Ambulatory Visit: Payer: Self-pay

## 2024-02-23 ENCOUNTER — Other Ambulatory Visit: Payer: Self-pay | Admitting: Family Medicine

## 2024-02-23 MED ORDER — SEMAGLUTIDE (2 MG/DOSE) 8 MG/3ML ~~LOC~~ SOPN
2.0000 mg | PEN_INJECTOR | SUBCUTANEOUS | 0 refills | Status: DC
  Filled 2024-02-23: qty 3, 28d supply, fill #0

## 2024-02-23 NOTE — Telephone Encounter (Signed)
 Will forward to provider

## 2024-02-23 NOTE — Telephone Encounter (Signed)
 Let pt know that we sent rxn for Ozempic to the pharmacy. We have increased the dose from 1 mg yo 2 mg once a week.

## 2024-02-23 NOTE — Telephone Encounter (Signed)
 Patient is requesting refills on Semaglutide, 1 MG/DOSE, 4 MG/3ML SOPN  and a higher dosage if possible , patient states the current one is not working for her.

## 2024-02-23 NOTE — Addendum Note (Signed)
 Addended by: Concetta Dee B on: 02/23/2024 10:39 PM   Modules accepted: Orders

## 2024-02-24 ENCOUNTER — Other Ambulatory Visit: Payer: Self-pay

## 2024-02-24 NOTE — Telephone Encounter (Signed)
 Unable to refill per protocol, Rx expired. Discontinued 02/23/24.  Requested Prescriptions  Pending Prescriptions Disp Refills   Semaglutide, 1 MG/DOSE, (OZEMPIC, 1 MG/DOSE,) 4 MG/3ML SOPN 3 mL 3    Sig: Inject 1 mg as directed once a week.     Endocrinology:  Diabetes - GLP-1 Receptor Agonists - semaglutide Passed - 02/24/2024  3:45 PM      Passed - HBA1C in normal range and within 180 days    HbA1c, POC (prediabetic range)  Date Value Ref Range Status  12/03/2019 5.9 5.7 - 6.4 % Final   HbA1c, POC (controlled diabetic range)  Date Value Ref Range Status  10/15/2023 5.9 0.0 - 7.0 % Final         Passed - Cr in normal range and within 360 days    Creat  Date Value Ref Range Status  04/01/2016 0.73 0.50 - 1.10 mg/dL Final   Creatinine, Ser  Date Value Ref Range Status  10/15/2023 0.77 0.57 - 1.00 mg/dL Final   Creatinine, Urine  Date Value Ref Range Status  04/06/2014 170.33 mg/dL Final         Passed - Valid encounter within last 6 months    Recent Outpatient Visits           4 months ago Type 2 diabetes mellitus with other specified complication, without long-term current use of insulin (HCC)   [place] [facility] - A Dept Of [place]. [facility] Joaquin Mulberry, MD   10 months ago Type 2 diabetes mellitus with obesity [facility])   [place] [facility] - A Dept Of [place]. [facility] Joaquin Mulberry, MD   1 year ago Diabetes mellitus type 2 in obese [facility])   [place] [facility] - A Dept Of [place]. [facility] Joaquin Mulberry, MD   1 year ago Prediabetes   [place] [facility] [place] - A Dept Of [place]. [facility], Stan Eans, [place]   1 year ago Secondary amenorrhea   [place] [facility] [place] - A Dept Of . [facility] Lawrance Presume, MD       Future Appointments             In 1 month Joaquin Mulberry, MD [facility] [place] - A Dept Of [place]. [facility]

## 2024-02-24 NOTE — Telephone Encounter (Signed)
 Called patient and she is aware of dosage change   Interpreter id # [phone]

## 2024-02-25 ENCOUNTER — Other Ambulatory Visit: Payer: Self-pay

## 2024-02-25 ENCOUNTER — Other Ambulatory Visit: Payer: Self-pay | Admitting: Pharmacist

## 2024-02-25 MED ORDER — SEMAGLUTIDE (1 MG/DOSE) 4 MG/3ML ~~LOC~~ SOPN
1.0000 mg | PEN_INJECTOR | SUBCUTANEOUS | 1 refills | Status: DC
  Filled 2024-02-25: qty 3, 28d supply, fill #0
  Filled 2024-03-25 – 2024-03-29 (×2): qty 3, 28d supply, fill #1

## 2024-03-12 ENCOUNTER — Other Ambulatory Visit: Payer: Self-pay

## 2024-03-23 ENCOUNTER — Other Ambulatory Visit: Payer: Self-pay

## 2024-03-25 ENCOUNTER — Other Ambulatory Visit: Payer: Self-pay

## 2024-03-26 ENCOUNTER — Telehealth: Payer: Self-pay

## 2024-03-26 ENCOUNTER — Other Ambulatory Visit: Payer: Self-pay

## 2024-03-26 NOTE — Telephone Encounter (Signed)
 Received notification from [employer] regarding approval for OZEMPIC . Patient assistance approved from 04/08/2024 to 04/08/2025.  Medication will ship to [address], [place], [place] [address]  Pt ID: [id number]  Company phone: [phone]

## 2024-03-29 ENCOUNTER — Other Ambulatory Visit: Payer: Self-pay

## 2024-03-30 ENCOUNTER — Other Ambulatory Visit: Payer: Self-pay

## 2024-04-09 ENCOUNTER — Other Ambulatory Visit: Payer: Self-pay

## 2024-04-14 ENCOUNTER — Encounter: Payer: Self-pay | Admitting: Family Medicine

## 2024-04-14 ENCOUNTER — Ambulatory Visit: Payer: Self-pay | Attending: Family Medicine | Admitting: Family Medicine

## 2024-04-14 ENCOUNTER — Other Ambulatory Visit: Payer: Self-pay

## 2024-04-14 ENCOUNTER — Other Ambulatory Visit: Payer: Self-pay | Admitting: Pharmacist

## 2024-04-14 VITALS — BP 124/83 | HR 97 | Ht 65.0 in | Wt 315.4 lb

## 2024-04-14 DIAGNOSIS — Z7985 Long-term (current) use of injectable non-insulin antidiabetic drugs: Secondary | ICD-10-CM

## 2024-04-14 DIAGNOSIS — E1169 Type 2 diabetes mellitus with other specified complication: Secondary | ICD-10-CM

## 2024-04-14 DIAGNOSIS — M549 Dorsalgia, unspecified: Secondary | ICD-10-CM

## 2024-04-14 DIAGNOSIS — N911 Secondary amenorrhea: Secondary | ICD-10-CM

## 2024-04-14 DIAGNOSIS — L309 Dermatitis, unspecified: Secondary | ICD-10-CM

## 2024-04-14 LAB — POCT GLYCOSYLATED HEMOGLOBIN (HGB A1C): HbA1c, POC (controlled diabetic range): 5.8 % (ref 0.0–7.0)

## 2024-04-14 MED ORDER — DICLOFENAC SODIUM 1 % EX GEL
4.0000 g | Freq: Two times a day (BID) | CUTANEOUS | 1 refills | Status: AC
  Filled 2024-04-14: qty 100, 13d supply, fill #0
  Filled 2024-08-23: qty 100, 13d supply, fill #1

## 2024-04-14 MED ORDER — ATORVASTATIN CALCIUM 20 MG PO TABS
20.0000 mg | ORAL_TABLET | Freq: Every day | ORAL | 1 refills | Status: DC
  Filled 2024-04-14: qty 90, 90d supply, fill #0

## 2024-04-14 MED ORDER — SEMAGLUTIDE (2 MG/DOSE) 8 MG/3ML ~~LOC~~ SOPN
2.0000 mg | PEN_INJECTOR | SUBCUTANEOUS | 0 refills | Status: DC
  Filled 2024-04-14 (×2): qty 3, 28d supply, fill #0

## 2024-04-14 MED ORDER — SEMAGLUTIDE (1 MG/DOSE) 4 MG/3ML ~~LOC~~ SOPN
1.0000 mg | PEN_INJECTOR | SUBCUTANEOUS | 1 refills | Status: DC
Start: 2024-04-14 — End: 2024-05-12
  Filled 2024-04-14: qty 3, 28d supply, fill #0

## 2024-04-14 MED ORDER — TRIAMCINOLONE ACETONIDE 0.1 % EX CREA
1.0000 | TOPICAL_CREAM | Freq: Two times a day (BID) | CUTANEOUS | 0 refills | Status: AC
  Filled 2024-04-14: qty 30, 15d supply, fill #0

## 2024-04-14 NOTE — Progress Notes (Signed)
 Subjective:  Patient ID: Ashley Grant, female    DOB: 1981/06/19  Age: 43 y.o. MRN: [id number]  CC: Medical Management of Chronic Issues (Menstrual questions/Dry skin on both knees X52months/Back pain)     Discussed the use of AI scribe software for clinical note transcription with the patient, who gave verbal consent to proceed.  History of Present Illness Ashley Grant is a 43 year old female with a history of Type 2 diabetes, obesity. who presents with concerns about medication management and menstrual irregularities.  She has been taking 1 mg of Semaglutide  for two months, despite instructions to start a 2 mg dose. She is unsure why the higher dose has not been provided by the Pharmacy even though med list reveals it was sent 2 months ago. There is confusion about her adherence to atorvastatin , as she has not been taking Lipitor regularly.  She has not had a menstrual period since 2022 following a pregnancy loss. Provera  previously induced a period, but she has not menstruated since. An ultrasound was performed in December which showed no acute abnormalities.  She has dry skin on her knees for three months, unresponsive to hydrocortisone  cream, without itching or involvement of the elbows.  She experiences severe back pain for two weeks, worsening with standing, without radiation to the legs. Voltaren  gel is used for pain relief.     Past Medical History:  Diagnosis Date   Obesity (BMI 30-39.9)    PCOS (polycystic ovarian syndrome)     Past Surgical History:  Procedure Laterality Date   CESAREAN SECTION N/A 04/08/2014   Procedure: CESAREAN SECTION;  Surgeon: Tresia Fruit, MD;  Location: [facility];  Service: Obstetrics;  Laterality: N/A;    Family History  Problem Relation Age of Onset   Diabetes Father    Diabetes Maternal Grandfather    Cancer Paternal Grandfather     Social History   Socioeconomic History   Marital status: Single     Spouse name: Not on file   Number of children: Not on file   Years of education: Not on file   Highest education level: Not on file  Occupational History   Not on file  Tobacco Use   Smoking status: Never   Smokeless tobacco: Never  Vaping Use   Vaping status: Never Used  Substance and Sexual Activity   Alcohol use: No   Drug use: No   Sexual activity: Yes  Other Topics Concern   Not on file  Social History Narrative   ** Merged History Encounter **       Social Drivers of Health   Financial Resource Strain: Medium Risk (10/15/2023)   Overall Financial Resource Strain (CARDIA)    Difficulty of Paying Living Expenses: Somewhat hard  Food Insecurity: No Food Insecurity (10/15/2023)   Hunger Vital Sign    Worried About Running Out of Food in the Last Year: Never true    Ran Out of Food in the Last Year: Never true  Transportation Needs: No Transportation Needs (10/15/2023)   PRAPARE - Administrator, Civil Service (Medical): No    Lack of Transportation (Non-Medical): No  Physical Activity: Insufficiently Active (10/15/2023)   Exercise Vital Sign    Days of Exercise per Week: 2 days    Minutes of Exercise per Session: 60 min  Stress: No Stress Concern Present (10/15/2023)   [employer] of Occupational Health - Occupational Stress Questionnaire    Feeling of Stress :  Not at all  Social Connections: Unknown (10/15/2023)   Social Connection and Isolation Panel [NHANES]    Frequency of Communication with Friends and Family: Twice a week    Frequency of Social Gatherings with Friends and Family: Once a week    Attends Religious Services: More than 4 times per year    Active Member of [employer] or Organizations: No    Attends Engineer, structural: More than 4 times per year    Marital Status: Patient declined    No Known Allergies  Outpatient Medications Prior to Visit  Medication Sig Dispense Refill   hydrocortisone  cream 0.5 % Apply 1 Application  topically 2 (two) times daily. 30 g 1   diclofenac  Sodium (VOLTAREN ) 1 % GEL Apply 4 g topically in the morning and at bedtime. 100 g 1   Semaglutide , 2 MG/DOSE, 8 MG/3ML SOPN Inject 2 mg as directed once a week. 3 mL 0   Blood Glucose Monitoring Suppl (TRUE METRIX METER) w/Device KIT Testing once daily. (Patient not taking: Reported on 04/14/2024) 1 kit 0   glucose blood (TRUE METRIX BLOOD GLUCOSE TEST) test strip Testing three times daily before meals. (Patient not taking: Reported on 04/14/2024) 100 each 12   TRUEplus Lancets 28G MISC Testing 3 (three) times daily before meals. (Patient not taking: Reported on 04/14/2024) 100 each 12   atorvastatin  (LIPITOR) 20 MG tablet Take 1 tablet (20 mg total) by mouth daily. (Patient not taking: Reported on 04/14/2024) 90 tablet 1   Semaglutide , 1 MG/DOSE, 4 MG/3ML SOPN Inject 1 mg as directed once a week. (Patient not taking: Reported on 04/14/2024) 3 mL 1   No facility-administered medications prior to visit.     ROS Review of Systems  Constitutional:  Negative for activity change and appetite change.  HENT:  Negative for sinus pressure and sore throat.   Respiratory:  Negative for chest tightness, shortness of breath and wheezing.   Cardiovascular:  Negative for chest pain and palpitations.  Gastrointestinal:  Negative for abdominal distention, abdominal pain and constipation.  Genitourinary:  Positive for menstrual problem.  Musculoskeletal:  Positive for back pain.  Skin:  Positive for rash.  Psychiatric/Behavioral:  Negative for behavioral problems and dysphoric mood.     Objective:  BP 124/83   Pulse 97   Ht 5\' 5"  (1.651 m)   Wt (!) 315 lb 6.4 oz (143.1 kg)   SpO2 93%   BMI 52.49 kg/m      04/14/2024    9:08 AM 10/15/2023    3:18 PM 04/16/2023   11:28 AM  BP/Weight  Systolic BP 124 138 111  Diastolic BP 83 88 81  Wt. (Lbs) 315.4 319.2 316.8  BMI 52.49 kg/m2 53.12 kg/m2 52.72 kg/m2      Physical Exam Constitutional:      Appearance:  She is well-developed.  Cardiovascular:     Rate and Rhythm: Normal rate.     Heart sounds: Normal heart sounds. No murmur heard. Pulmonary:     Effort: Pulmonary effort is normal.     Breath sounds: Normal breath sounds. No wheezing or rales.  Chest:     Chest wall: No tenderness.  Abdominal:     General: Bowel sounds are normal. There is no distension.     Palpations: Abdomen is soft. There is no mass.     Tenderness: There is no abdominal tenderness.  Musculoskeletal:        General: No tenderness. Normal range of motion.  Right lower leg: No edema.     Left lower leg: No edema.     Comments: No TTP of lumbar spine Negative straight leg raise bilaterally  Skin:    Comments: Dry rash in anterior knees  Neurological:     Mental Status: She is alert and oriented to person, place, and time.  Psychiatric:        Mood and Affect: Mood normal.        Latest Ref Rng & Units 10/15/2023    4:07 PM 01/16/2023   12:25 PM 07/11/2021   10:41 AM  CMP  Glucose 70 - 99 mg/dL 87  82  90   BUN 6 - 24 mg/dL 12  13  9    Creatinine 0.57 - 1.00 mg/dL 2.95  6.21  3.08   Sodium 134 - 144 mmol/L 141  141  139   Potassium 3.5 - 5.2 mmol/L 4.3  4.5  4.3   Chloride 96 - 106 mmol/L 101  102  102   CO2 20 - 29 mmol/L 25  22  24    Calcium  8.7 - 10.2 mg/dL 9.6  9.2  9.2   Total Protein 6.0 - 8.5 g/dL  7.2    Total Bilirubin 0.0 - 1.2 mg/dL  0.3    Alkaline Phos 44 - 121 IU/L  124    AST 0 - 40 IU/L  18    ALT 0 - 32 IU/L  20      Lipid Panel     Component Value Date/Time   CHOL 221 (H) 01/16/2023 1225   TRIG 148 01/16/2023 1225   HDL 36 (L) 01/16/2023 1225   CHOLHDL 6.9 (H) 11/14/2020 1109   LDLCALC 158 (H) 01/16/2023 1225    CBC    Component Value Date/Time   WBC 9.1 07/11/2021 1041   WBC 8.2 04/27/2020 1302   RBC 4.98 07/11/2021 1041   RBC 4.49 04/27/2020 1302   HGB 14.4 07/11/2021 1041   HCT 42.7 07/11/2021 1041   PLT 355 07/11/2021 1041   MCV 86 07/11/2021 1041   MCH 28.9  07/11/2021 1041   MCH 28.7 04/27/2020 1302   MCHC 33.7 07/11/2021 1041   MCHC 32.1 04/27/2020 1302   RDW 12.4 07/11/2021 1041   LYMPHSABS 3.0 07/11/2021 1041   MONOABS 350 04/01/2016 1505   EOSABS 0.1 07/11/2021 1041   BASOSABS 0.1 07/11/2021 1041    Lab Results  Component Value Date   HGBA1C 5.8 04/14/2024      1. Type 2 diabetes mellitus with other specified complication, without long-term current use of insulin (HCC) (Primary) Controled with A1c of 5.8 Rx for Ozempic  2mg  sent again to Pharmacy - POCT glycosylated hemoglobin (Hb A1C) - atorvastatin  (LIPITOR) 20 MG tablet; Take 1 tablet (20 mg total) by mouth daily.  Dispense: 90 tablet; Refill: 1 - Semaglutide , 2 MG/DOSE, 8 MG/3ML SOPN; Inject 2 mg as directed once a week.  Dispense: 3 mL; Refill: 0 - Microalbumin / creatinine urine ratio - LP+Non-HDL Cholesterol - CMP14+EGFR  2. Dermatitis - triamcinolone  cream (KENALOG ) 0.1 %; Apply 1 Application topically 2 (two) times daily.  Dispense: 30 g; Refill: 0  3. Musculoskeletal back pain Advised to apply heat or ice whichever is tolerated to painful areas. Counseled on evidence of improvement in pain control with regards to yoga, water aerobics, massage, home physical therapy, exercise as tolerated. - diclofenac  Sodium (VOLTAREN ) 1 % GEL; Apply 4 g topically in the morning and at bedtime.  Dispense: 100 g;  Refill: 1  4. Long-term current use of injectable noninsulin antidiabetic medication - Semaglutide , 2 MG/DOSE, 8 MG/3ML SOPN; Inject 2 mg as directed once a week.  Dispense: 3 mL; Refill: 0  5. Secondary amenorrhea Referred to GYN but they were unsuccessful in reaching her I have provided her with their contact info  Meds ordered this encounter  Medications   atorvastatin  (LIPITOR) 20 MG tablet    Sig: Take 1 tablet (20 mg total) by mouth daily.    Dispense:  90 tablet    Refill:  1   Semaglutide , 2 MG/DOSE, 8 MG/3ML SOPN    Sig: Inject 2 mg as directed once a week.     Dispense:  3 mL    Refill:  0   triamcinolone  cream (KENALOG ) 0.1 %    Sig: Apply 1 Application topically 2 (two) times daily.    Dispense:  30 g    Refill:  0   diclofenac  Sodium (VOLTAREN ) 1 % GEL    Sig: Apply 4 g topically in the morning and at bedtime.    Dispense:  100 g    Refill:  1    Follow-up: Return in about 6 months (around 10/14/2024) for Chronic medical conditions.       Joaquin Mulberry, MD, FAAFP. [facility] [place], [place] [phone]   04/14/2024, 12:32 PM

## 2024-04-14 NOTE — Patient Instructions (Signed)
 Please call Gynecology to schedule an appointment: Placed in [facility]  [address]  [place], [place] [address] [phone]

## 2024-04-15 LAB — MICROALBUMIN / CREATININE URINE RATIO
Creatinine, Urine: 348.5 mg/dL
Microalb/Creat Ratio: 6 mg/g{creat} (ref 0–29)
Microalbumin, Urine: 19.5 ug/mL

## 2024-04-15 LAB — CMP14+EGFR
ALT: 17 IU/L (ref 0–32)
AST: 14 IU/L (ref 0–40)
Albumin: 4.3 g/dL (ref 3.9–4.9)
Alkaline Phosphatase: 150 IU/L — ABNORMAL HIGH (ref 44–121)
BUN/Creatinine Ratio: 17 (ref 9–23)
BUN: 14 mg/dL (ref 6–24)
Bilirubin Total: 0.2 mg/dL (ref 0.0–1.2)
CO2: 20 mmol/L (ref 20–29)
Calcium: 9.2 mg/dL (ref 8.7–10.2)
Chloride: 106 mmol/L (ref 96–106)
Creatinine, Ser: 0.83 mg/dL (ref 0.57–1.00)
Globulin, Total: 2.8 g/dL (ref 1.5–4.5)
Glucose: 95 mg/dL (ref 70–99)
Potassium: 4.4 mmol/L (ref 3.5–5.2)
Sodium: 141 mmol/L (ref 134–144)
Total Protein: 7.1 g/dL (ref 6.0–8.5)
eGFR: 90 mL/min/{1.73_m2} (ref >59)

## 2024-04-15 LAB — LP+NON-HDL CHOLESTEROL
Cholesterol, Total: 178 mg/dL (ref 100–199)
HDL: 34 mg/dL — ABNORMAL LOW (ref >39)
LDL Chol Calc (NIH): 126 mg/dL — ABNORMAL HIGH (ref 0–99)
Total Non-HDL-Chol (LDL+VLDL): 144 mg/dL — ABNORMAL HIGH (ref 0–129)
Triglycerides: 96 mg/dL (ref 0–149)
VLDL Cholesterol Cal: 18 mg/dL (ref 5–40)

## 2024-04-16 ENCOUNTER — Ambulatory Visit: Payer: Self-pay | Admitting: Family Medicine

## 2024-04-16 ENCOUNTER — Other Ambulatory Visit: Payer: Self-pay

## 2024-04-16 DIAGNOSIS — E1169 Type 2 diabetes mellitus with other specified complication: Secondary | ICD-10-CM | POA: Insufficient documentation

## 2024-04-16 MED ORDER — ATORVASTATIN CALCIUM 40 MG PO TABS
40.0000 mg | ORAL_TABLET | Freq: Every day | ORAL | 1 refills | Status: AC
  Filled 2024-04-16 – 2024-08-23 (×2): qty 90, 90d supply, fill #0

## 2024-04-27 ENCOUNTER — Other Ambulatory Visit: Payer: Self-pay

## 2024-05-12 ENCOUNTER — Other Ambulatory Visit: Payer: Self-pay | Admitting: Family Medicine

## 2024-05-12 ENCOUNTER — Other Ambulatory Visit: Payer: Self-pay

## 2024-05-12 ENCOUNTER — Other Ambulatory Visit: Payer: Self-pay | Admitting: Pharmacist

## 2024-05-12 DIAGNOSIS — E1169 Type 2 diabetes mellitus with other specified complication: Secondary | ICD-10-CM

## 2024-05-12 DIAGNOSIS — Z7985 Long-term (current) use of injectable non-insulin antidiabetic drugs: Secondary | ICD-10-CM

## 2024-05-12 MED ORDER — SEMAGLUTIDE (2 MG/DOSE) 8 MG/3ML ~~LOC~~ SOPN
2.0000 mg | PEN_INJECTOR | SUBCUTANEOUS | 2 refills | Status: AC
  Filled 2024-05-12: qty 9, 84d supply, fill #0
  Filled 2024-08-23: qty 9, 84d supply, fill #1
  Filled 2024-11-30: qty 3, 28d supply, fill #2

## 2024-06-10 ENCOUNTER — Encounter: Payer: Self-pay | Admitting: Obstetrics and Gynecology

## 2024-06-10 ENCOUNTER — Ambulatory Visit (INDEPENDENT_AMBULATORY_CARE_PROVIDER_SITE_OTHER): Payer: Self-pay | Admitting: Obstetrics and Gynecology

## 2024-06-10 ENCOUNTER — Other Ambulatory Visit: Payer: Self-pay

## 2024-06-10 ENCOUNTER — Other Ambulatory Visit (HOSPITAL_COMMUNITY)
Admission: RE | Admit: 2024-06-10 | Discharge: 2024-06-10 | Disposition: A | Discharge status: Home / Self Care | Payer: Self-pay | Source: Ambulatory Visit | Attending: Obstetrics and Gynecology | Admitting: Obstetrics and Gynecology

## 2024-06-10 VITALS — BP 113/83 | HR 94 | Wt 309.0 lb

## 2024-06-10 DIAGNOSIS — Z202 Contact with and (suspected) exposure to infections with a predominantly sexual mode of transmission: Secondary | ICD-10-CM | POA: Insufficient documentation

## 2024-06-10 DIAGNOSIS — Z3202 Encounter for pregnancy test, result negative: Secondary | ICD-10-CM

## 2024-06-10 DIAGNOSIS — Z603 Acculturation difficulty: Secondary | ICD-10-CM

## 2024-06-10 DIAGNOSIS — N898 Other specified noninflammatory disorders of vagina: Secondary | ICD-10-CM | POA: Insufficient documentation

## 2024-06-10 DIAGNOSIS — L680 Hirsutism: Secondary | ICD-10-CM

## 2024-06-10 DIAGNOSIS — E282 Polycystic ovarian syndrome: Secondary | ICD-10-CM

## 2024-06-10 DIAGNOSIS — Z6841 Body Mass Index (BMI) 40.0 and over, adult: Secondary | ICD-10-CM

## 2024-06-10 DIAGNOSIS — E669 Obesity, unspecified: Secondary | ICD-10-CM

## 2024-06-10 DIAGNOSIS — E1169 Type 2 diabetes mellitus with other specified complication: Secondary | ICD-10-CM

## 2024-06-10 DIAGNOSIS — N911 Secondary amenorrhea: Secondary | ICD-10-CM

## 2024-06-10 DIAGNOSIS — Z758 Other problems related to medical facilities and other health care: Secondary | ICD-10-CM

## 2024-06-10 LAB — POCT PREGNANCY, URINE: Preg Test, Ur: NEGATIVE

## 2024-06-10 MED ORDER — MEDROXYPROGESTERONE ACETATE 10 MG PO TABS
10.0000 mg | ORAL_TABLET | Freq: Every day | ORAL | 3 refills | Status: DC
  Filled 2024-06-10: qty 42, 84d supply, fill #0

## 2024-06-11 NOTE — Progress Notes (Signed)
 Obstetrics and Gynecology New Patient Evaluation  Appointment Date: 06/10/2024  OBGYN Clinic: [facility]  Primary Care Provider: Delbert Clam  Referring Provider: Delbert Clam, MD  Chief Complaint: Secondary amenorrhea  History of Present Illness: Ashley Grant is a 43 y.o. Hispanic G2P1011, seen for the above chief complaint. Her past medical history is significant for BMI 50s, PCOS, DM2, c-section x 1  Patient states last period was around early Fall 2023 and that she started having irregular periods since her last baby was born (2015) and the subsequent weight gain; patient is about 100 lbs more now vs around the time of the pregnancy. She states she used a medicine that sounds like provera  and that induced a period; this was sometime in the past, ?last year. Patient seen by her PCP and he ordered a pelvis u/s for the secondary amenorrhea in 10/2023 and this was normal with an endometrial stripe that was 5.12mm and wnl.   Patient only endorses some vaginal discharge but no other GYN s/s. She denies any menopausal s/s.   Review of Systems: Pertinent items are noted in HPI.   Patient Active Problem List   Diagnosis Date Noted   Hyperlipidemia associated with type 2 diabetes mellitus (HCC) 04/16/2024   Type 2 diabetes mellitus with obesity (HCC) 01/16/2023   Encounter for counseling 10/02/2020   PCOS (polycystic ovarian syndrome) 03/26/2018   Hirsutism 03/26/2018   Acute pain of left knee 05/19/2017   Obesity 04/01/2016   Environmental allergies 04/01/2016   Ganglion cyst of wrist 04/01/2016   Abnormal finding on urinalysis 04/01/2016    Past Medical History:  Past Medical History:  Diagnosis Date   Obesity (BMI 30-39.9)    PCOS (polycystic ovarian syndrome)    Past Surgical History:  Past Surgical History:  Procedure Laterality Date   CESAREAN SECTION N/A 04/08/2014   Procedure: CESAREAN SECTION;  Surgeon: Lynwood KANDICE Solomons, MD;  Location: [facility];  Service: Obstetrics;  Laterality: N/A;   Past Obstetrical History:  OB History  Gravida Para Term Preterm AB Living  2 1 1  1 1   SAB IAB Ectopic Multiple Live Births  1    1    # Outcome Date GA Lbr Len/2nd Weight Sex Type Anes PTL Lv  2 Term 04/08/14 [redacted]w[redacted]d  7 lb 10 oz (3.459 kg) F CS-LTranv EPI  LIV     Complications: Elevated BP without diagnosis of hypertension  1 SAB 2002 [redacted]w[redacted]d          Past Gynecological History: As per HPI. History of Pap Smear(s): Yes.   Last pap 11/2020, which was pap and hpv negative She is currently using no method for contraception.   Social History:  Social History   Socioeconomic History   Marital status: Single    Spouse name: Not on file   Number of children: Not on file   Years of education: Not on file   Highest education level: Not on file  Occupational History   Not on file  Tobacco Use   Smoking status: Never   Smokeless tobacco: Never  Vaping Use   Vaping status: Never Used  Substance and Sexual Activity   Alcohol use: No   Drug use: No   Sexual activity: Yes  Other Topics Concern   Not on file  Social History Narrative   ** Merged History Encounter **       Social Drivers of Health   Financial Resource Strain: Medium Risk (  10/15/2023)   Overall Financial Resource Strain (CARDIA)    Difficulty of Paying Living Expenses: Somewhat hard  Food Insecurity: No Food Insecurity (10/15/2023)   Hunger Vital Sign    Worried About Running Out of Food in the Last Year: Never true    Ran Out of Food in the Last Year: Never true  Transportation Needs: No Transportation Needs (10/15/2023)   PRAPARE - Administrator, Civil Service (Medical): No    Lack of Transportation (Non-Medical): No  Physical Activity: Insufficiently Active (10/15/2023)   Exercise Vital Sign    Days of Exercise per Week: 2 days    Minutes of Exercise per Session: 60 min  Stress: No Stress Concern Present (10/15/2023)   [employer] of Occupational Health - Occupational Stress Questionnaire    Feeling of Stress : Not at all  Social Connections: Unknown (10/15/2023)   Social Connection and Isolation Panel    Frequency of Communication with Friends and Family: Twice a week    Frequency of Social Gatherings with Friends and Family: Once a week    Attends Religious Services: More than 4 times per year    Active Member of [employer] or Organizations: No    Attends Engineer, structural: More than 4 times per year    Marital Status: Patient declined  Intimate Partner Violence: Not At Risk (10/15/2023)   Humiliation, Afraid, Rape, and Kick questionnaire    Fear of Current or Ex-Partner: No    Emotionally Abused: No    Physically Abused: No    Sexually Abused: No   Family History:  Family History  Problem Relation Age of Onset   Diabetes Father    Diabetes Maternal Grandfather    Cancer Paternal Grandfather     Medications Shavonne M. Siliesar Wilfred had no medications administered during this visit. Current Outpatient Medications  Medication Sig Dispense Refill   atorvastatin  (LIPITOR) 40 MG tablet Take 1 tablet (40 mg total) by mouth daily. 90 tablet 1   diclofenac  Sodium (VOLTAREN ) 1 % GEL Apply 4 g topically in the morning and at bedtime. 100 g 1   hydrocortisone  cream 0.5 % Apply 1 Application topically 2 (two) times daily. 30 g 1   medroxyPROGESTERone  (PROVERA ) 10 MG tablet Take 1 tablet (10 mg total) by mouth daily for 14 days each month. Repeat every month. 42 tablet 3   Semaglutide , 2 MG/DOSE, 8 MG/3ML SOPN Inject 2 mg as directed once a week. 9 mL 2   triamcinolone  cream (KENALOG ) 0.1 % Apply 1 Application topically 2 (two) times daily. 30 g 0   Blood Glucose Monitoring Suppl (TRUE METRIX METER) w/Device KIT Testing once daily. (Patient not taking: Reported on 06/10/2024) 1 kit 0   glucose blood (TRUE METRIX BLOOD GLUCOSE TEST) test strip Testing three times daily before meals. (Patient not taking:  Reported on 06/10/2024) 100 each 12   TRUEplus Lancets 28G MISC Testing 3 (three) times daily before meals. (Patient not taking: Reported on 06/10/2024) 100 each 12   No current facility-administered medications for this visit.   Allergies Patient has no known allergies.  Physical Exam:  BP 113/83   Pulse 94   Wt (!) 309 lb (140.2 kg)   Breastfeeding No   BMI 51.42 kg/m  Body mass index is 51.42 kg/m.  General appearance: Well nourished, well developed female in no acute distress.  Cardiovascular: normal s1 and s2.  No murmurs, rubs or gallops. Respiratory:  Clear to auscultation bilateral. Normal respiratory  effort Abdomen: positive bowel sounds and no masses, hernias; diffusely non tender to palpation, non distended Neuro/Psych:  Normal mood and affect.  Skin:  Warm and dry.  Lymphatic:  No inguinal lymphadenopathy.   Cervical exam performed in the presence of a chaperone Pelvic exam: is limited by body habitus Patient intolerant of exam EGBUS: within normal limits Vagina: non-malodorous white, watery d/c, no VB Cervix: normal appearing cervix without tenderness, discharge or lesions Uterus:  nonenlarged and non tender Adnexa:  normal adnexa and no mass, fullness, tenderness Rectovaginal: deferred  I was unable to do the embx due to patient intolerance of exam: Endometrial Biopsy Attempt Procedure Note  Pre-operative Diagnosis: BMI 50s, DM2, PCOS, irregular periods/secondary amenorrhea  Post-operative Diagnosis: same; pt intolerance of exam  Indications: concern for endometrial hyperplasia  Procedure Details  Cervical exam performed in the presence of a chaperone Urine pregnancy test was done and result was negative.  The risks (including infection, bleeding, pain, and uterine perforation) and benefits of the procedure were explained to the patient and Written informed consent was obtained.  The patient was placed in the dorsal lithotomy position.  Bimanual exam showed  the uterus to be in the neutral position.  A Graves' speculum inserted in the vagina, and the cervix was visualized. Patient intolerant of exam and kept pushing the speculum out. I was able to placed a tenaculum on the anterior lip but then she kept pushing speculum out. Patient wanted to try a second attempt but unable to get past where I did prior and embx unable to be attempted.  Condition: Stable  Complications: Patient intolerance of exam   Plan: The patient was advised to call for any fever or for prolonged or severe pain or bleeding. She was advised to use OTC analgesics as needed for mild to moderate pain. She was advised to avoid vaginal intercourse for 48 hours or until the bleeding has completely stopped  Laboratory: UPT negative 04/2023 A1c 5.8, 04/2022 FSH/LH/TSH wnl  Radiology:  Images reviewed by me and I agree with below Narrative & Impression  CLINICAL DATA:  Secondary amenorrhea   EXAM: TRANSABDOMINAL AND TRANSVAGINAL ULTRASOUND OF PELVIS   TECHNIQUE: Both transabdominal and transvaginal ultrasound examinations of the pelvis were performed. Transabdominal technique was performed for global imaging of the pelvis including uterus, ovaries, adnexal regions, and pelvic cul-de-sac. It was necessary to proceed with endovaginal exam following the transabdominal exam to visualize the endometrium.   COMPARISON:  None Available.   FINDINGS: Uterus   Measurements: 8.6 x 4.1 x 4.7 cm = volume: 86.6 mL. No fibroids or other mass visualized.   Endometrium   Thickness: 5.3 mm.  No focal abnormality visualized.   Right ovary   Measurements: 3.4 x 2.1 x 2.1 cm = volume: 8.1 mL. Normal appearance/no adnexal mass.   Left ovary   Measurements: 3.2 x 2.1 x 2.2 cm = volume: 7.5 mL. Normal appearance/no adnexal mass.   Other findings   No abnormal free fluid.   IMPRESSION: Unremarkable pelvic ultrasound.     Electronically Signed   By: Bard Moats M.D.   On:  11/07/2023 12:59   Assessment: patient stable  Plan:  1. Secondary amenorrhea (Primary) I told her this is most likely from her PCOS. Will recheck labs and also check to see if could be in early menopause.  I told her re: risk of endometrial hyperplasia, uterine cancer risk if not having regular periods and not on anything for period or birth control. I  told her that embx is a way to assess this but unable to do in office due to patient discomfort. I d/w her re: OR hysteroscopy, d&c, but I also told her that an u/s can't definitively rule in or out and is inferior to embx but this can be an option. I told her if stripe is still thin then unlikely to have abnormal cells and can forego tissue sampling. Pt amenable to this plan. Pt understands to wait on doing cyclic provera  until she has the ultrasound   I d/w her re: medications to help with her periods, hyperplasia. She is not interested in birth control b/c she may want to try and conceive. I told her options include mirena, but wouldn't be great option in her given exam intolerance and cyclic provera  and pt elects the latter. Provera  10 mg po qday x 14d each month; pt is aware that this is not birth control.  - Beta hCG quant (ref lab) - FSH - Estradiol - TSH+Prl+TestT+TestF+17OHP - DHEA-sulfate - Anti mullerian hormone - US  Transvaginal Non-OB; Future  2. PCOS (polycystic ovarian syndrome) See above. Pt already on semaglutide  so metformin  not offered. I told her the best solution is also the hardest, which is weight loss, specifically trying to get back to her 2015 weight. Pt has already lost 20-30lbs per report and pt congratulated and encouragement given.  - US  Transvaginal Non-OB; Future  3. Type 2 diabetes mellitus with obesity (HCC) - US  Transvaginal Non-OB; Future  4. BMI 50.0-59.9, adult (HCC) - US  Transvaginal Non-OB; Future  5. Vaginal discharge - Cervicovaginal ancillary only( [place])  6. STD exposure -  Cervicovaginal ancillary only( [place])  7. Language barrier In person interpreter used  8. Hirsutism Since may want to conceive and not on birth control, can hold off on offering aldactone  Orders Placed This Encounter  Procedures   US  Transvaginal Non-OB   Beta hCG quant (ref lab)   FSH   Estradiol   TSH+Prl+TestT+TestF+17OHP   DHEA-sulfate   Anti mullerian hormone   Pregnancy, urine POC    Return in about 3 months (around 09/10/2024) for with dr izell, in person.  Future Appointments  Date Time Provider Department Center  06/16/2024 10:00 AM [facility]  1 [facility]  10/14/2024  9:10 AM Delbert Clam, MD [facility] None    Bebe izell Raddle MD Attending Center for [facility])

## 2024-06-14 LAB — CERVICOVAGINAL ANCILLARY ONLY
Bacterial Vaginitis (gardnerella): NEGATIVE
Candida Glabrata: NEGATIVE
Candida Vaginitis: POSITIVE — AB
Chlamydia: NEGATIVE
Comment: NEGATIVE
Comment: NEGATIVE
Comment: NEGATIVE
Comment: NEGATIVE
Comment: NEGATIVE
Comment: NORMAL
Neisseria Gonorrhea: NEGATIVE
Trichomonas: NEGATIVE

## 2024-06-16 ENCOUNTER — Ambulatory Visit (HOSPITAL_BASED_OUTPATIENT_CLINIC_OR_DEPARTMENT_OTHER): Payer: Self-pay

## 2024-06-16 ENCOUNTER — Other Ambulatory Visit: Payer: Self-pay

## 2024-06-16 ENCOUNTER — Ambulatory Visit: Payer: Self-pay | Admitting: Obstetrics and Gynecology

## 2024-06-16 MED ORDER — FLUCONAZOLE 150 MG PO TABS
150.0000 mg | ORAL_TABLET | ORAL | 0 refills | Status: AC
  Filled 2024-06-16: qty 2, 6d supply, fill #0

## 2024-06-16 NOTE — Telephone Encounter (Addendum)
-----   Message from [place] sent at 06/16/2024  8:04 AM EDT ----- Can you let her know that she has a yeast infection and I've sent in medications in for her? thanks ----- Message ----- From: Interface, Lab In [place] Sent: 06/10/2024   4:16 PM EDT To: Bebe Furry, MD  Called pt with Eda R., pt notified of results and that tx was sent to her [facility].    Carrol Hougland, RN

## 2024-06-18 ENCOUNTER — Ambulatory Visit (HOSPITAL_BASED_OUTPATIENT_CLINIC_OR_DEPARTMENT_OTHER)
Admission: RE | Admit: 2024-06-18 | Discharge: 2024-06-18 | Disposition: A | Discharge status: Home / Self Care | Payer: Self-pay | Source: Ambulatory Visit | Attending: Obstetrics and Gynecology | Admitting: Obstetrics and Gynecology

## 2024-06-18 DIAGNOSIS — E1169 Type 2 diabetes mellitus with other specified complication: Secondary | ICD-10-CM | POA: Insufficient documentation

## 2024-06-18 DIAGNOSIS — N911 Secondary amenorrhea: Secondary | ICD-10-CM | POA: Insufficient documentation

## 2024-06-18 DIAGNOSIS — E282 Polycystic ovarian syndrome: Secondary | ICD-10-CM | POA: Insufficient documentation

## 2024-06-18 DIAGNOSIS — E669 Obesity, unspecified: Secondary | ICD-10-CM | POA: Insufficient documentation

## 2024-06-18 DIAGNOSIS — Z6841 Body Mass Index (BMI) 40.0 and over, adult: Secondary | ICD-10-CM | POA: Insufficient documentation

## 2024-06-18 LAB — BETA HCG QUANT (REF LAB): hCG Quant: <1 m[IU]/mL

## 2024-06-18 LAB — ESTRADIOL: Estradiol: 39.4 pg/mL

## 2024-06-18 LAB — DHEA-SULFATE: DHEA-SO4: 151 ug/dL (ref 57.3–279.2)

## 2024-06-18 LAB — TSH+PRL+TESTT+TESTF+17OHP
17-Hydroxyprogesterone: 38 ng/dL
Prolactin: 5.9 ng/mL (ref 4.8–33.4)
TSH: 1.75 u[IU]/mL (ref 0.450–4.500)
Testosterone, Free: 2.8 pg/mL (ref 0.0–4.2)
Testosterone, Total, LC/MS: 55.5 ng/dL

## 2024-06-18 LAB — ANTI MULLERIAN HORMONE: ANTI-MULLERIAN HORMONE (AMH): 3.27 ng/mL

## 2024-06-18 LAB — FOLLICLE STIMULATING HORMONE: FSH: 5.9 m[IU]/mL

## 2024-07-07 ENCOUNTER — Telehealth: Payer: Self-pay

## 2024-07-07 NOTE — Telephone Encounter (Signed)
 Message left for patient that she Will need to contact Bebe Furry, MD  as he is the doctor that order test and labs .

## 2024-07-07 NOTE — Telephone Encounter (Signed)
 Copied from CRM [id number]. Topic: Clinical - Lab/Test Results >> Jul 07, 2024 12:28 PM Ivette P wrote: Reason for CRM: pt called in because they gave her results on her chart about Polycystic Ovaries and would like for someone to call her and explain more about results.   Pt callback [phone]

## 2024-07-16 NOTE — Telephone Encounter (Addendum)
 RN called pt to inform her of normal US  results.  She confirms she has started Provera  per Dr. Izell.  Advised pt that Dr. Izell recommends she be scheduled for a follow up visit in October.  Pt verbalized understanding and had no further questions at this time.  Scheduling request sent to front off staff.  Advised pt to call office if she doesn't see scheduled appointment in MyChart in a few days.  Phone call was interpreted by [facility] [phone].   Waddell, RN   ----- Message from Bebe Izell sent at 07/16/2024  6:32 AM EDT ----- Hi, I don't see a follow up visit for her  Can you let her know that her ultrasound was normal and if she hasn't started 14 day course of Provera  that I gave her at the last visit to go ahead and start it. Also, please make her an appointment  for sometime in October. thanks ----- Message ----- From: Interface, Rad Results In Sent: 07/02/2024   1:43 AM EDT To: Bebe Izell, MD

## 2024-07-23 ENCOUNTER — Other Ambulatory Visit: Payer: Self-pay

## 2024-08-02 ENCOUNTER — Other Ambulatory Visit: Payer: Self-pay

## 2024-08-23 ENCOUNTER — Other Ambulatory Visit: Payer: Self-pay

## 2024-08-24 ENCOUNTER — Other Ambulatory Visit: Payer: Self-pay

## 2024-08-24 ENCOUNTER — Ambulatory Visit: Payer: Self-pay | Admitting: Obstetrics and Gynecology

## 2024-08-24 ENCOUNTER — Encounter: Payer: Self-pay | Admitting: Obstetrics and Gynecology

## 2024-08-24 VITALS — BP 106/75 | HR 93 | Wt 314.6 lb

## 2024-08-24 DIAGNOSIS — Z1331 Encounter for screening for depression: Secondary | ICD-10-CM

## 2024-08-24 DIAGNOSIS — N911 Secondary amenorrhea: Secondary | ICD-10-CM

## 2024-08-24 DIAGNOSIS — Z603 Acculturation difficulty: Secondary | ICD-10-CM

## 2024-08-24 DIAGNOSIS — Z758 Other problems related to medical facilities and other health care: Secondary | ICD-10-CM

## 2024-08-24 DIAGNOSIS — E282 Polycystic ovarian syndrome: Secondary | ICD-10-CM

## 2024-08-24 MED ORDER — MEDROXYPROGESTERONE ACETATE 10 MG PO TABS
10.0000 mg | ORAL_TABLET | Freq: Every day | ORAL | 1 refills | Status: AC
  Filled 2024-08-24: qty 84, 84d supply, fill #0
  Filled 2024-09-27: qty 42, 84d supply, fill #0

## 2024-08-26 DIAGNOSIS — Z603 Acculturation difficulty: Secondary | ICD-10-CM | POA: Insufficient documentation

## 2024-08-26 DIAGNOSIS — N911 Secondary amenorrhea: Secondary | ICD-10-CM | POA: Insufficient documentation

## 2024-08-26 NOTE — Progress Notes (Signed)
 Obstetrics and Gynecology Visit Return Patient Evaluation  Appointment Date: 08/24/2024  Primary Care Provider: Delbert Clam  OBGYN Clinic: [facility]  Chief Complaint: follow up secondary amenorrhea with PCOS  History of Present Illness:  Ashley Grant is a 43 y.o. G2P1011 seen by me on 7/31 for a new patient visit for secondary amenorrhea. Her past medical history is significant for BMI 50s, PCOS, DM2, c-section x 1.    Last period at that visit was Fall 2023. EMbx attempted but unable to do due to patient intolerance. Repeat u/s (2024 u/s normal with thin stripe) and labs ordered. Swabs showed yeast infection with negative amh and pcos labs, quant and still normal u/s (see below). Pt desired to leave open pregnancy and was amenable to cyclic provera   Interval History: Since that time, she states that she's had three periods on the cyclic provera .  Review of Systems: as noted in the History of Present Illness.  Patient Active Problem List   Diagnosis Date Noted   Secondary amenorrhea 08/26/2024   Hyperlipidemia associated with type 2 diabetes mellitus (HCC) 04/16/2024   Type 2 diabetes mellitus with obesity 01/16/2023   Encounter for counseling 10/02/2020   PCOS (polycystic ovarian syndrome) 03/26/2018   Hirsutism 03/26/2018   Acute pain of left knee 05/19/2017   Obesity 04/01/2016   Environmental allergies 04/01/2016   Ganglion cyst of wrist 04/01/2016   Abnormal finding on urinalysis 04/01/2016   Medications:  Emojean M. Siliesar [name] had no medications administered during this visit. Current Outpatient Medications  Medication Sig Dispense Refill   atorvastatin  (LIPITOR) 40 MG tablet Take 1 tablet (40 mg total) by mouth daily. 90 tablet 1   diclofenac  Sodium (VOLTAREN ) 1 % GEL Apply 4 g topically in the morning and at bedtime. 100 g 1   Semaglutide , 2 MG/DOSE, 8 MG/3ML SOPN Inject 2 mg as directed once a week. 9 mL 2    Blood Glucose Monitoring Suppl (TRUE METRIX METER) w/Device KIT Testing once daily. (Patient not taking: Reported on 06/10/2024) 1 kit 0   glucose blood (TRUE METRIX BLOOD GLUCOSE TEST) test strip Testing three times daily before meals. (Patient not taking: Reported on 06/10/2024) 100 each 12   hydrocortisone  cream 0.5 % Apply 1 Application topically 2 (two) times daily. 30 g 1   medroxyPROGESTERone  (PROVERA ) 10 MG tablet Take 1 tablet (10 mg total) by mouth daily for 14 days each month. Repeat every month. 84 tablet 1   triamcinolone  cream (KENALOG ) 0.1 % Apply 1 Application topically 2 (two) times daily. (Patient not taking: Reported on 08/24/2024) 30 g 0   TRUEplus Lancets 28G MISC Testing 3 (three) times daily before meals. (Patient not taking: Reported on 08/24/2024) 100 each 12   No current facility-administered medications for this visit.    Allergies: has no known allergies.  Physical Exam:  BP 106/75   Pulse 93   Wt (!) 314 lb 9.6 oz (142.7 kg)   LMP 08/09/2024 (Approximate)   BMI 52.35 kg/m  Body mass index is 52.35 kg/m. General appearance: Well nourished, well developed female in no acute distress.  Neuro/Psych:  Normal mood and affect.    Radiology: Narrative & Impression  CLINICAL DATA:  Secondary amenorrhea. PCOS. Elevated BMI. Assess the endometrium. 43 year old premenopausal female.   EXAM: ULTRASOUND PELVIS TRANSVAGINAL   TECHNIQUE: Transvaginal ultrasound examination of the pelvis was performed including evaluation of the uterus, ovaries, adnexal regions, and pelvic cul-de-sac.   COMPARISON:  Pelvic ultrasound  11/07/2023. this was also done for the same indication.   FINDINGS: Uterus   Measurements: Anteverted measuring 8.3 x 4.3 x 5.0 cm = volume: 92.2 mL. No fibroids or other mass visualized. Unremarkable cervix.   Endometrium   Thickness: 5.5 mm.  No focal abnormality visualized.   Right ovary   Measurements: 2.7 x 2.4 x 2.5 cm = volume: 8.4  mL. Normal appearance/no adnexal mass.   Left ovary   Measurements: 3.8 x 2.3 x 2.4 cm = volume: 11.4 mL. Normal appearance/no adnexal mass.   Other findings: No abnormal free fluid. Both ovaries demonstrate color flow registration.   IMPRESSION: Negative endovaginal pelvic ultrasound.     Electronically Signed   By: Francis Quam M.D.   On: 07/02/2024 01:40     Assessment: patient doing well  Plan:  1. Secondary amenorrhea D/w her and she still desires option that isn't contraception so will continue on cyclic provea - medroxyPROGESTERone  (PROVERA ) 10 MG tablet; Take 1 tablet (10 mg total) by mouth daily for 14 days each month. Repeat every month.  Dispense: 84 tablet; Refill: 1  2. PCOS (polycystic ovarian syndrome) - medroxyPROGESTERone  (PROVERA ) 10 MG tablet; Take 1 tablet (10 mg total) by mouth daily for 14 days each month. Repeat every month.  Dispense: 84 tablet; Refill: 1  In person interpreter used  Return in about 6 months (around 02/22/2025) for in person, with dr izell.  Future Appointments  Date Time Provider Department Center  10/14/2024  9:10 AM Delbert Clam, MD [facility] MD Attending [facility])

## 2024-09-27 ENCOUNTER — Other Ambulatory Visit: Payer: Self-pay

## 2024-10-14 ENCOUNTER — Ambulatory Visit: Payer: Self-pay | Admitting: Family Medicine

## 2024-11-25 ENCOUNTER — Other Ambulatory Visit: Payer: Self-pay

## 2024-11-30 ENCOUNTER — Other Ambulatory Visit: Payer: Self-pay

## 2024-12-07 ENCOUNTER — Ambulatory Visit: Payer: Self-pay | Admitting: Family Medicine

## 2024-12-13 ENCOUNTER — Other Ambulatory Visit: Payer: Self-pay
